# Patient Record
Sex: Female | Born: 1935 | Race: White | Hispanic: No | Smoking: Former smoker
Health system: Southern US, Community
[De-identification: ages and names within clinical notes are randomized; demographics above are authoritative.]

## PROBLEM LIST (undated history)

## (undated) DIAGNOSIS — I2510 Atherosclerotic heart disease of native coronary artery without angina pectoris: Secondary | ICD-10-CM

## (undated) DIAGNOSIS — I10 Essential (primary) hypertension: Secondary | ICD-10-CM

## (undated) DIAGNOSIS — R634 Abnormal weight loss: Secondary | ICD-10-CM

## (undated) DIAGNOSIS — E785 Hyperlipidemia, unspecified: Secondary | ICD-10-CM

## (undated) DIAGNOSIS — K219 Gastro-esophageal reflux disease without esophagitis: Secondary | ICD-10-CM

## (undated) DIAGNOSIS — R197 Diarrhea, unspecified: Secondary | ICD-10-CM

## (undated) DIAGNOSIS — K529 Noninfective gastroenteritis and colitis, unspecified: Secondary | ICD-10-CM

## (undated) DIAGNOSIS — I4891 Unspecified atrial fibrillation: Secondary | ICD-10-CM

## (undated) HISTORY — DX: Hyperlipidemia, unspecified: E78.5

## (undated) HISTORY — DX: Noninfective gastroenteritis and colitis, unspecified: K52.9

## (undated) HISTORY — PX: HERNIA REPAIR: SHX51

## (undated) HISTORY — DX: Diarrhea, unspecified: R19.7

## (undated) HISTORY — DX: Essential (primary) hypertension: I10

## (undated) HISTORY — PX: OTHER SURGICAL HISTORY: SHX169

## (undated) HISTORY — PX: CORONARY ANGIOPLASTY WITH STENT PLACEMENT: SHX49

## (undated) HISTORY — DX: Abnormal weight loss: R63.4

## (undated) HISTORY — DX: Atherosclerotic heart disease of native coronary artery without angina pectoris: I25.10

## (undated) HISTORY — DX: Gastro-esophageal reflux disease without esophagitis: K21.9

---

## 1998-05-22 ENCOUNTER — Other Ambulatory Visit: Admission: RE | Admit: 1998-05-22 | Discharge: 1998-05-22 | Payer: Self-pay | Admitting: *Deleted

## 1998-08-11 ENCOUNTER — Other Ambulatory Visit: Admission: RE | Admit: 1998-08-11 | Discharge: 1998-08-11 | Payer: Self-pay | Admitting: Obstetrics and Gynecology

## 1998-08-14 ENCOUNTER — Other Ambulatory Visit: Admission: RE | Admit: 1998-08-14 | Discharge: 1998-08-14 | Payer: Self-pay | Admitting: Obstetrics and Gynecology

## 1999-06-21 ENCOUNTER — Ambulatory Visit (HOSPITAL_COMMUNITY): Admission: RE | Admit: 1999-06-21 | Discharge: 1999-06-21 | Payer: Self-pay | Admitting: Obstetrics and Gynecology

## 2000-06-23 ENCOUNTER — Ambulatory Visit (HOSPITAL_COMMUNITY): Admission: RE | Admit: 2000-06-23 | Discharge: 2000-06-23 | Payer: Self-pay | Admitting: Obstetrics and Gynecology

## 2000-06-23 ENCOUNTER — Encounter: Payer: Self-pay | Admitting: Obstetrics and Gynecology

## 2000-09-11 ENCOUNTER — Other Ambulatory Visit: Admission: RE | Admit: 2000-09-11 | Discharge: 2000-09-11 | Payer: Self-pay | Admitting: Obstetrics and Gynecology

## 2001-06-25 ENCOUNTER — Encounter: Payer: Self-pay | Admitting: Obstetrics and Gynecology

## 2001-06-25 ENCOUNTER — Ambulatory Visit (HOSPITAL_COMMUNITY): Admission: RE | Admit: 2001-06-25 | Discharge: 2001-06-25 | Payer: Self-pay | Admitting: Obstetrics and Gynecology

## 2001-07-23 ENCOUNTER — Encounter: Payer: Self-pay | Admitting: Emergency Medicine

## 2001-07-24 ENCOUNTER — Inpatient Hospital Stay (HOSPITAL_COMMUNITY): Admission: RE | Admit: 2001-07-24 | Discharge: 2001-07-29 | Payer: Self-pay | Admitting: Cardiology

## 2001-07-25 ENCOUNTER — Encounter: Payer: Self-pay | Admitting: Cardiology

## 2001-07-26 ENCOUNTER — Encounter: Payer: Self-pay | Admitting: Cardiology

## 2002-06-28 ENCOUNTER — Encounter: Payer: Self-pay | Admitting: Obstetrics and Gynecology

## 2002-06-28 ENCOUNTER — Ambulatory Visit (HOSPITAL_COMMUNITY): Admission: RE | Admit: 2002-06-28 | Discharge: 2002-06-28 | Payer: Self-pay | Admitting: Obstetrics and Gynecology

## 2002-07-01 ENCOUNTER — Encounter: Admission: RE | Admit: 2002-07-01 | Discharge: 2002-07-01 | Payer: Self-pay | Admitting: Obstetrics and Gynecology

## 2002-07-01 ENCOUNTER — Encounter: Payer: Self-pay | Admitting: Obstetrics and Gynecology

## 2003-07-05 ENCOUNTER — Encounter: Payer: Self-pay | Admitting: Obstetrics and Gynecology

## 2003-07-05 ENCOUNTER — Ambulatory Visit (HOSPITAL_COMMUNITY): Admission: RE | Admit: 2003-07-05 | Discharge: 2003-07-05 | Payer: Self-pay | Admitting: Obstetrics and Gynecology

## 2004-03-22 ENCOUNTER — Encounter: Admission: RE | Admit: 2004-03-22 | Discharge: 2004-03-22 | Payer: Self-pay | Admitting: Gastroenterology

## 2004-09-05 ENCOUNTER — Ambulatory Visit (HOSPITAL_COMMUNITY): Admission: RE | Admit: 2004-09-05 | Discharge: 2004-09-05 | Payer: Self-pay | Admitting: Obstetrics and Gynecology

## 2005-06-01 ENCOUNTER — Inpatient Hospital Stay (HOSPITAL_COMMUNITY): Admission: EM | Admit: 2005-06-01 | Discharge: 2005-06-10 | Payer: Self-pay | Admitting: Emergency Medicine

## 2005-06-03 ENCOUNTER — Encounter (INDEPENDENT_AMBULATORY_CARE_PROVIDER_SITE_OTHER): Payer: Self-pay | Admitting: *Deleted

## 2005-09-19 ENCOUNTER — Ambulatory Visit (HOSPITAL_COMMUNITY): Admission: RE | Admit: 2005-09-19 | Discharge: 2005-09-19 | Payer: Self-pay | Admitting: Obstetrics and Gynecology

## 2006-09-23 ENCOUNTER — Ambulatory Visit (HOSPITAL_COMMUNITY): Admission: RE | Admit: 2006-09-23 | Discharge: 2006-09-23 | Payer: Self-pay | Admitting: Obstetrics and Gynecology

## 2007-10-16 ENCOUNTER — Ambulatory Visit (HOSPITAL_COMMUNITY): Admission: RE | Admit: 2007-10-16 | Discharge: 2007-10-16 | Payer: Self-pay | Admitting: Obstetrics and Gynecology

## 2008-06-20 ENCOUNTER — Encounter: Payer: Self-pay | Admitting: Gastroenterology

## 2008-07-06 ENCOUNTER — Ambulatory Visit: Payer: Self-pay | Admitting: Gastroenterology

## 2008-07-06 DIAGNOSIS — R197 Diarrhea, unspecified: Secondary | ICD-10-CM

## 2008-07-06 DIAGNOSIS — R634 Abnormal weight loss: Secondary | ICD-10-CM

## 2008-07-07 LAB — CONVERTED CEMR LAB
ALT: 16 units/L (ref 0–35)
AST: 26 units/L (ref 0–37)
Alkaline Phosphatase: 34 units/L — ABNORMAL LOW (ref 39–117)
Basophils Absolute: 0 10*3/uL (ref 0.0–0.1)
Basophils Relative: 0.5 % (ref 0.0–1.0)
CO2: 27 meq/L (ref 19–32)
Calcium: 9.1 mg/dL (ref 8.4–10.5)
Chloride: 103 meq/L (ref 96–112)
GFR calc non Af Amer: 73 mL/min
Hemoglobin: 12 g/dL (ref 12.0–15.0)
Lymphocytes Relative: 19.9 % (ref 12.0–46.0)
MCV: 96.7 fL (ref 78.0–100.0)
Monocytes Absolute: 0.4 10*3/uL (ref 0.1–1.0)
Neutro Abs: 4.4 10*3/uL (ref 1.4–7.7)
Neutrophils Relative %: 71.4 % (ref 43.0–77.0)
Platelets: 367 10*3/uL (ref 150–400)
RDW: 13.5 % (ref 11.5–14.6)
Sed Rate: 12 mm/hr (ref 0–22)
TSH: 0.83 microintl units/mL (ref 0.35–5.50)
Total Bilirubin: 0.6 mg/dL (ref 0.3–1.2)
WBC: 6.1 10*3/uL (ref 4.5–10.5)

## 2008-07-08 ENCOUNTER — Telehealth (INDEPENDENT_AMBULATORY_CARE_PROVIDER_SITE_OTHER): Payer: Self-pay | Admitting: *Deleted

## 2008-07-08 ENCOUNTER — Encounter: Payer: Self-pay | Admitting: Gastroenterology

## 2008-07-08 ENCOUNTER — Inpatient Hospital Stay (HOSPITAL_COMMUNITY): Admission: AD | Admit: 2008-07-08 | Discharge: 2008-07-11 | Payer: Self-pay | Admitting: Gastroenterology

## 2008-07-08 ENCOUNTER — Ambulatory Visit: Payer: Self-pay | Admitting: Gastroenterology

## 2008-07-11 ENCOUNTER — Telehealth (INDEPENDENT_AMBULATORY_CARE_PROVIDER_SITE_OTHER): Payer: Self-pay | Admitting: *Deleted

## 2008-07-11 ENCOUNTER — Encounter: Payer: Self-pay | Admitting: Gastroenterology

## 2008-07-12 ENCOUNTER — Ambulatory Visit: Payer: Self-pay | Admitting: Gastroenterology

## 2008-08-02 ENCOUNTER — Ambulatory Visit: Payer: Self-pay | Admitting: Gastroenterology

## 2008-08-02 DIAGNOSIS — M359 Systemic involvement of connective tissue, unspecified: Secondary | ICD-10-CM | POA: Insufficient documentation

## 2008-08-08 ENCOUNTER — Telehealth: Payer: Self-pay | Admitting: Gastroenterology

## 2008-10-13 ENCOUNTER — Telehealth: Payer: Self-pay | Admitting: Gastroenterology

## 2008-10-26 ENCOUNTER — Telehealth: Payer: Self-pay | Admitting: Internal Medicine

## 2008-11-15 ENCOUNTER — Ambulatory Visit: Payer: Self-pay | Admitting: Gastroenterology

## 2009-04-17 ENCOUNTER — Telehealth: Payer: Self-pay | Admitting: Gastroenterology

## 2009-05-11 ENCOUNTER — Ambulatory Visit (HOSPITAL_COMMUNITY): Admission: RE | Admit: 2009-05-11 | Discharge: 2009-05-11 | Payer: Self-pay | Admitting: Obstetrics and Gynecology

## 2009-05-12 ENCOUNTER — Ambulatory Visit: Payer: Self-pay | Admitting: Gastroenterology

## 2010-01-22 ENCOUNTER — Telehealth: Payer: Self-pay | Admitting: Gastroenterology

## 2010-02-19 ENCOUNTER — Ambulatory Visit: Payer: Self-pay | Admitting: Gastroenterology

## 2010-06-03 ENCOUNTER — Inpatient Hospital Stay (HOSPITAL_COMMUNITY): Admission: EM | Admit: 2010-06-03 | Discharge: 2010-06-08 | Payer: Self-pay | Admitting: Emergency Medicine

## 2010-10-17 ENCOUNTER — Ambulatory Visit (HOSPITAL_COMMUNITY): Admission: RE | Admit: 2010-10-17 | Discharge: 2010-10-17 | Payer: Self-pay | Admitting: Obstetrics and Gynecology

## 2011-01-31 NOTE — Assessment & Plan Note (Signed)
Review of gastrointestinal problems: 1. Collagenous colitis. Presented June 2009 with perfuse diarrhea. Underwent colonoscopy early July 2009 essentially normal examination but random colon biopsies showed collagenous colitis. Responded fairly quickly to budesonide 3 pills a day.  Also suffered some more than typical scope trauma during colonoscopy and was admitted with prophylactic antibiotics and observation. She was discharged without problems 48 hours later.  She switched to low-dose prednisone do to prohibitive cost of Entocort. May, 2010 she has found that 5 mg a day of prednisone does not hold her symptoms however 10 mg of prednisone a day keeps her diarrhea completely controlled.  February, 2011: had tapered down prednisone to 2.5 milligrams every other day and started to flare. Increased prednisone to 15 mg a day with dramatic, complete response.   History of Present Illness Visit Type: Follow-up Visit Primary GI MD: Rob Bunting MD Primary Provider: Gildardo Cranker, MD  Requesting Provider: n/a Chief Complaint: Colitis flare  History of Present Illness:     very pleasant 77 year old woman whom I last saw may 2010. She is here today with her son who usually accompanies her on her visits.  She has well-documented collagenous colitis. Her prednisone was tapered down to 2 half milligrams every other day and that held her for quite sometime until she had a dramatic flare of diarrhea about 4 weeks ago. she was increased to 15mg  a day.  This resulted in very quick, dramatic complete response.           Current Medications (verified): 1)  Advair Diskus 100-50 Mcg/dose  Misc (Fluticasone-Salmeterol) .Marland Kitchen.. 1 Puff Twice Daily 2)  Benicar 20 Mg  Tabs (Olmesartan Medoxomil) .... One Tablet Every Day 3)  Pravachol 80 Mg Tabs (Pravastatin Sodium) 4)  Lexapro 10 Mg  Tabs (Escitalopram Oxalate) .... 1/2 Tablet At Bedtime Once Daily 5)  Mucinex Dm 30-600 Mg  Tb12 (Dextromethorphan-Guaifenesin) ....  Twice Daily As Needed 6)  Vitamin D 1000 Unit  Tabs (Cholecalciferol) .... One Tablet Twice A Day 7)  Multivitamins   Tabs (Multiple Vitamin) .... One Tablet Once A Day 8)  Prednisone 5 Mg  Tabs (Prednisone) .... As Directed  Allergies (verified): No Known Drug Allergies  Vital Signs:  Patient profile:   77 year old female Height:      64 inches Weight:      98 pounds BSA:     1.45 Pulse rate:   68 / minute Pulse rhythm:   regular BP sitting:   110 / 64  (left arm) Cuff size:   regular  Vitals Entered By: Ok Anis CMA (February 19, 2010 2:47 PM)  Physical Exam  Additional Exam:  Constitutional:very thin, chronically. Appears unchanged over several months Psychiatric: alert and oriented times 3 Abdomen: soft, non-tender, non-distended, normal bowel sounds    Impression & Recommendations:  Problem # 1:  COLLAGENOUS COLITIS (ICD-710.9) she will continue on prednisone for now. Previously we had put her on Entocort but that was cost prohibitive. She will decrease her prednisone to 10 mg every day for the next month and then she will start 10 mg alternating with 5 mg. She will return to see me in 10 weeks and sooner if needed. She has had trouble with swelling in her ankles but most recently when taking 15 mg of prednisone a day, the swelling improved.  Patient Instructions: 1)  Start to back off on the steroids, down to 10mg  a day for one months. 2)  Then decrease to 5mg  a day, alternating with  10mg  for one month. 3)  Then 5mg  every day. 4)  Return to see Dr. Christella Hartigan in 10 weeks. 5)  A copy of this information will be sent to Dr. Miguel Aschoff. 6)  The medication list was reviewed and reconciled.  All changed / newly prescribed medications were explained.  A complete medication list was provided to the patient / caregiver.

## 2011-01-31 NOTE — Progress Notes (Signed)
Summary: drug change  Phone Note Call from Patient Call back at [phone]   Caller: son, [name] Call For: Dr. Christella Hartigan Reason for Call: Talk to Nurse Summary of Call: son states that pt is having to stay on Prednisone for maintinance and still having flares... would like to know if there is another drug that would be better for pt Initial call taken by: Vallarie Mare,  January 22, 2010 3:48 PM  Follow-up for Phone Call        pt is currently taking a half of a 5 mg prednisone now but is still having flares.  Is there anything else she can take or should she increase her prednisone? Follow-up by: Chales Abrahams CMA Duncan Dull),  January 22, 2010 3:52 PM  Additional Follow-up for Phone Call Additional follow up Details #1::        should increase to 10mg  a day, rov in next 3-4 weeks Additional Follow-up by: Rachael Fee MD,  January 22, 2010 7:53 PM    Additional Follow-up for Phone Call Additional follow up Details #2::    pt aware rov schedueld for 02/19/10 Follow-up by: Chales Abrahams CMA (AAMA),  January 23, 2010 9:11 AM

## 2011-03-11 ENCOUNTER — Ambulatory Visit (INDEPENDENT_AMBULATORY_CARE_PROVIDER_SITE_OTHER): Payer: Medicare Other | Admitting: Gastroenterology

## 2011-03-11 ENCOUNTER — Encounter: Payer: Self-pay | Admitting: Gastroenterology

## 2011-03-11 DIAGNOSIS — K5289 Other specified noninfective gastroenteritis and colitis: Secondary | ICD-10-CM

## 2011-03-18 LAB — CBC
HCT: 30.5 % — ABNORMAL LOW (ref 36.0–46.0)
HCT: 31.1 % — ABNORMAL LOW (ref 36.0–46.0)
HCT: 37.5 % (ref 36.0–46.0)
Hemoglobin: 10 g/dL — ABNORMAL LOW (ref 12.0–15.0)
Hemoglobin: 10.2 g/dL — ABNORMAL LOW (ref 12.0–15.0)
Hemoglobin: 10.3 g/dL — ABNORMAL LOW (ref 12.0–15.0)
Hemoglobin: 12.6 g/dL (ref 12.0–15.0)
Hemoglobin: 13.3 g/dL (ref 12.0–15.0)
MCHC: 33.2 g/dL (ref 30.0–36.0)
MCHC: 33.4 g/dL (ref 30.0–36.0)
MCV: 98.1 fL (ref 78.0–100.0)
MCV: 98.4 fL (ref 78.0–100.0)
Platelets: 254 10*3/uL (ref 150–400)
Platelets: 296 10*3/uL (ref 150–400)
RBC: 3.05 MIL/uL — ABNORMAL LOW (ref 3.87–5.11)
RBC: 3.12 MIL/uL — ABNORMAL LOW (ref 3.87–5.11)
RBC: 3.16 MIL/uL — ABNORMAL LOW (ref 3.87–5.11)
RBC: 3.51 MIL/uL — ABNORMAL LOW (ref 3.87–5.11)
RBC: 3.85 MIL/uL — ABNORMAL LOW (ref 3.87–5.11)
RBC: 4.1 MIL/uL (ref 3.87–5.11)
RDW: 13.9 % (ref 11.5–15.5)
RDW: 14.3 % (ref 11.5–15.5)
WBC: 5 10*3/uL (ref 4.0–10.5)
WBC: 5.5 10*3/uL (ref 4.0–10.5)
WBC: 8.9 10*3/uL (ref 4.0–10.5)

## 2011-03-18 LAB — CARDIAC PANEL(CRET KIN+CKTOT+MB+TROPI)
CK, MB: 1.8 ng/mL (ref 0.3–4.0)
CK, MB: 2.2 ng/mL (ref 0.3–4.0)
Relative Index: 2.8 — ABNORMAL HIGH (ref 0.0–2.5)
Relative Index: INVALID (ref 0.0–2.5)
Relative Index: INVALID (ref 0.0–2.5)
Total CK: 119 U/L (ref 7–177)
Total CK: 68 U/L (ref 7–177)
Total CK: 87 U/L (ref 7–177)
Troponin I: 0.02 ng/mL (ref 0.00–0.06)
Troponin I: 0.03 ng/mL (ref 0.00–0.06)

## 2011-03-18 LAB — BASIC METABOLIC PANEL
CO2: 27 mEq/L (ref 19–32)
Calcium: 7.7 mg/dL — ABNORMAL LOW (ref 8.4–10.5)
Chloride: 112 mEq/L (ref 96–112)
GFR calc Af Amer: >60 mL/min (ref >60)
GFR calc non Af Amer: >60 mL/min (ref >60)
GFR calc non Af Amer: >60 mL/min (ref >60)
Glucose, Bld: 110 mg/dL — ABNORMAL HIGH (ref 70–99)
Potassium: 2.9 mEq/L — ABNORMAL LOW (ref 3.5–5.1)
Potassium: 3.1 mEq/L — ABNORMAL LOW (ref 3.5–5.1)
Sodium: 140 mEq/L (ref 135–145)
Sodium: 141 mEq/L (ref 135–145)
Sodium: 142 mEq/L (ref 135–145)

## 2011-03-18 LAB — DIFFERENTIAL
Basophils Absolute: 0 10*3/uL (ref 0.0–0.1)
Eosinophils Relative: 0 % (ref 0–5)
Lymphocytes Relative: 18 % (ref 12–46)
Lymphs Abs: 1.6 10*3/uL (ref 0.7–4.0)
Neutro Abs: 6.8 10*3/uL (ref 1.7–7.7)

## 2011-03-18 LAB — GLUCOSE, CAPILLARY
Glucose-Capillary: 107 mg/dL — ABNORMAL HIGH (ref 70–99)
Glucose-Capillary: 113 mg/dL — ABNORMAL HIGH (ref 70–99)
Glucose-Capillary: 116 mg/dL — ABNORMAL HIGH (ref 70–99)
Glucose-Capillary: 130 mg/dL — ABNORMAL HIGH (ref 70–99)
Glucose-Capillary: 132 mg/dL — ABNORMAL HIGH (ref 70–99)
Glucose-Capillary: 143 mg/dL — ABNORMAL HIGH (ref 70–99)
Glucose-Capillary: 69 mg/dL — ABNORMAL LOW (ref 70–99)
Glucose-Capillary: 88 mg/dL (ref 70–99)

## 2011-03-18 LAB — COMPREHENSIVE METABOLIC PANEL
ALT: 19 U/L (ref 0–35)
ALT: 22 U/L (ref 0–35)
AST: 22 U/L (ref 0–37)
AST: 26 U/L (ref 0–37)
Albumin: 3.1 g/dL — ABNORMAL LOW (ref 3.5–5.2)
Albumin: 3.7 g/dL (ref 3.5–5.2)
Alkaline Phosphatase: 23 U/L — ABNORMAL LOW (ref 39–117)
Alkaline Phosphatase: 30 U/L — ABNORMAL LOW (ref 39–117)
BUN: 15 mg/dL (ref 6–23)
CO2: 27 mEq/L (ref 19–32)
Calcium: 8.5 mg/dL (ref 8.4–10.5)
Chloride: 106 mEq/L (ref 96–112)
Creatinine, Ser: 0.87 mg/dL (ref 0.4–1.2)
Creatinine, Ser: 0.89 mg/dL (ref 0.4–1.2)
GFR calc Af Amer: >60 mL/min (ref >60)
GFR calc non Af Amer: >60 mL/min (ref >60)
Glucose, Bld: 165 mg/dL — ABNORMAL HIGH (ref 70–99)
Potassium: 3.4 mEq/L — ABNORMAL LOW (ref 3.5–5.1)
Potassium: 3.7 mEq/L (ref 3.5–5.1)
Sodium: 136 mEq/L (ref 135–145)
Sodium: 139 mEq/L (ref 135–145)
Total Bilirubin: 0.6 mg/dL (ref 0.3–1.2)
Total Bilirubin: 0.7 mg/dL (ref 0.3–1.2)
Total Protein: 6 g/dL (ref 6.0–8.3)

## 2011-03-18 LAB — URINALYSIS, ROUTINE W REFLEX MICROSCOPIC
Specific Gravity, Urine: >1.046 — ABNORMAL HIGH (ref 1.005–1.030)
Urobilinogen, UA: 0.2 mg/dL (ref 0.0–1.0)
pH: 6 (ref 5.0–8.0)

## 2011-03-18 LAB — CULTURE, BLOOD (ROUTINE X 2): Culture: NO GROWTH

## 2011-03-18 LAB — URINE MICROSCOPIC-ADD ON

## 2011-03-19 NOTE — Assessment & Plan Note (Signed)
Review of gastrointestinal problems: 1. Collagenous colitis. Presented June 2009 with perfuse diarrhea. Underwent colonoscopy early July 2009 essentially normal examination but random colon biopsies showed collagenous colitis. Responded fairly quickly to budesonide 3 pills a day.  Also suffered some more than typical scope trauma during colonoscopy and was admitted with prophylactic antibiotics and observation. She was discharged without problems 48 hours later.  She switched to low-dose prednisone do to prohibitive cost of Entocort. May, 2010 she has found that 5 mg a day of prednisone does not hold her symptoms however 10 mg of prednisone a day keeps her diarrhea completely controlled.  February, 2011: had tapered down prednisone to 2.5 milligrams every other day and started to flare. Increased prednisone to 15 mg a day with dramatic, complete response   History of Present Illness Visit Type: Follow-up Visit Primary GI MD: Rob Bunting MD Primary Provider: Gildardo Cranker, MD  Requesting Provider: n/a Chief Complaint: Follow up Colitis History of Present Illness:       very pleasant 77 year old woman whom I last saw about a year ago.she is on prednsone 10mg  a day total and on that regimen her bowels are under very good control. she will actually occasionally take MiraLax powder if she hasn't had a bowel movement in a day or 2. She fell recently , she has easy bruising, very thin skin           Current Medications (verified): 1)  Advair Diskus 100-50 Mcg/dose  Misc (Fluticasone-Salmeterol) .Marland Kitchen.. 1 Puff Twice Daily 2)  Pravachol 80 Mg Tabs (Pravastatin Sodium) .... Take 1/2 Tablet By Mouth Once Daily 3)  Lexapro 10 Mg  Tabs (Escitalopram Oxalate) .... 1/2 Tablet At Bedtime Once Daily 4)  Calcium-Vitamin D 250-125 Mg-Unit Tabs (Calcium Carbonate-Vitamin D) .... Take One By Mouth Two Times A Day 5)  Multivitamins   Tabs (Multiple Vitamin) .... One Tablet Once A Day 6)  Prednisone 5 Mg  Tabs  (Prednisone) .... Take One By Mouth Two Times A Day 7)  Aspirin 81 Mg Tbec (Aspirin) .... Take One By Mouth Once Daily 8)  Tramadol Hcl 50 Mg Tabs (Tramadol Hcl) .... Take One By Mouth As Needed 9)  Hydrocodone-Acetaminophen 5-500 Mg Tabs (Hydrocodone-Acetaminophen) .... Take One By Mouth As Needed  Allergies (verified): No Known Drug Allergies  Vital Signs:  Patient profile:   77 year old female Height:      64 inches Weight:      87.2 pounds BMI:     15.02 Pulse rate:   80 / minute Pulse rhythm:   regular BP sitting:   122 / 78  (left arm) Cuff size:   regular  Vitals Entered By: Harlow Mares CMA Duncan Dull) (March 11, 2011 2:52 PM)  Physical Exam  Additional Exam:  Constitutional:  very thin, this is normal for her Psychiatric: alert and oriented times 3 Abdomen: soft, non-tender, non-distended, normal bowel sounds    Impression & Recommendations:  Problem # 1:   collagenous colitis  I would like her to be able to come off of her prednisone it more. She will decreased down to 7.5 mg a day for a month and then 5 mg a day after that if her diarrhea does not return.  Patient Instructions: 1)  Try cutting back on prednsione to 1/2 pill in evening, full pill in AM.  2)  If after 1 month, there are no changes in your bowels then try 1/2 pill in AM and 1/2 pill in PM. 3)  A  copy of this information will be sent to Dr. Duane Lope. 4)  The medication list was reviewed and reconciled.  All changed / newly prescribed medications were explained.  A complete medication list was provided to the patient / caregiver. Prescriptions: PREDNISONE 5 MG  TABS (PREDNISONE) take one by mouth two times a day  #60 x 5   Entered and Authorized by:   Rachael Fee MD   Signed by:   Rachael Fee MD on 03/11/2011   Method used:   Electronically to        [id number] (retail)       [address]       [place], [place]  [address]       Ph: [phone] or [phone]       Fax: [phone]    RxID:   [id number]

## 2011-05-14 NOTE — Discharge Summary (Signed)
Ariel Hunt, Ariel Hunt                  ACCOUNT NO.:  [id number]   MEDICAL RECORD NO.:  [id number]          PATIENT TYPE:  INP   LOCATION:  1533                         FACILITY:  [facility]   PHYSICIAN:  Rachael Fee, MD   DATE OF BIRTH:  April 04, 1923   DATE OF ADMISSION:  07/08/2008  DATE OF DISCHARGE:  07/11/2008                               DISCHARGE SUMMARY   ADMITTING DIAGNOSES:  1. An 77 year old white female with descending colon superficial      mucosal tear at time of colonoscopy, July 08, 2008, admitted for      observation.  2. Recent diarrhea and weight loss, etiology not clear, rule out      microscopic colitis.  3. Coronary artery disease status post stenting, 2002.  4. Chronic obstructive pulmonary disease.  5. History of small bowel obstruction, lysis of adhesions, 2006.  6. Status post hysterectomy and repair of incarcerated hernia.  7. Hypertension.   DISCHARGE DIAGNOSES:  1. Stable status post descending colon trauma at colonoscopy with      superficial mucosal tear, minimally symptomatic.  2. Other diagnoses as listed above.   CONSULTATIONS:  None.   PROCEDURES:  Plain abdominal films.   BRIEF HISTORY:  Ariel Hunt is a pleasant 77 year old white female, primary  patient of Dr. Miguel Aschoff, who was referred recently to Dr. Wendall Papa  for evaluation new onset of diarrhea which had been present over the  past 3 to 3-1/2 weeks.  She, prior to that, had traditionally had  problems with constipation and this was an abrupt change.  Most  concerning was the fact that her stools were very urgent and she had had  a few episodes of incontinence.  She had not noted any melena or  hematochezia, had no complaints of abdominal pain but had lost  approximately 10 pounds recently.  Stool cultures, etc., were done.  She  was then scheduled for colonoscopy which she underwent on the morning of  this admission at the [facility].  The exam was negative  but with inability  to reach the terminal ileum despite multiple  attempts.  Random biopsies were taken to rule out microscopic colitis.  Patient did sustain an 8- to 10-cm superficial colonic tear in the  descending colon at the time of the procedure.  No full perforation was  noted.  Postprocedure, she is hemodynamically stable and comfortable but  admitted to the hospital for observation and bowel rest.   LABORATORY STUDIES:  On July 08, 2008, WBC of 5.7, hemoglobin 12.3,  hematocrit of 36.6, MCV of 95, platelets 288.  Followup on July 11, 2008, WBC of 4.9, hemoglobin 11.2, hematocrit of 33.4, platelets 255.  Electrolytes within normal limits.  Glucose was 122 on admission.  Creatinine 0.69, albumin 3.5.  LFTs normal.   Plain abdominal films on July 08, 2008, showed a large amount of gas  throughout the colon and small bowel.  No evidence of free air.  Borderline heart size.  Probable COPD.   HOSPITAL COURSE:  Patient was admitted to the service of Dr. Jesusita Oka  Christella Hartigan.  She was kept on a clear liquid diet, given analgesics and antiemetics as  needed, and covered with IV Unasyn.  Fortunately, she really did not  have any significant pain as a result of the superficial tear and had a  very benign hospital course.  She was kept on relative bowel rest and IV  antibiotics for 48 hours.  Her diet was then advanced and on July 11, 2008, she was eating a solid diet, having of formed, nonbloody stools,  abdomen was soft and benign, hemoglobin was 11.2, white count 4.9, and  she was allowed discharge to home in a stable condition with  instructions to follow up with Dr. Christella Hartigan in the office in approximately  2 weeks, to call if any problems in the interim.  She was to maintain a  soft diet, continue all of her usual home meds including Advair 10/50  one puff b.i.d., Benicar 20 daily, Pravachol 40 q.h.s., Lexapro 5 mg  daily, Mucinex DM as needed, vitamin D supplement daily, multivitamin  daily, Ecotrin 325 was to  be held for 2 weeks, and then she was to  continue on Augmentin 1 p.o. t.i.d. for 7 days and Imodium 1 p.o. b.i.d.   CONDITION ON DISCHARGE:  Stable.      Mike Gip, PA-C      Rachael Fee, MD  Electronically Signed    AE/MEDQ  D:  07/27/2008  T:  07/27/2008  Job:  [id number]

## 2011-05-14 NOTE — H&P (Signed)
[name], Hunt NO.:  [id number]   MEDICAL RECORD NO.:  [id number]          PATIENT TYPE:  INP   LOCATION:  1533                         FACILITY:  [facility]   PHYSICIAN:  Rachael Fee, MD   DATE OF BIRTH:  1923/01/06   DATE OF ADMISSION:  07/08/2008  DATE OF DISCHARGE:                              HISTORY & PHYSICAL   PROBLEM:  Descending colon injury at colonoscopy done today, July 08, 2008.   HISTORY OF PRESENT ILLNESS:  Ariel Hunt is a pleasant 77 year old white  female primary patient of Dr. Duane Lope who was referred recently to Dr.  Christella Hartigan for evaluation of new onset of diarrhea.  Apparently, she has had  diarrhea for approximately 3 to 3-1/2 weeks.  She has traditionally had  problems with constipation and this is a an abrupt change.  She  described her stools as being very loose and occurring two to three  times daily.  She says occasionally she has some days with no bowel  movement at all.  What is most concerning to her is that the stools are  very urgent and she has had a few episodes of incontinence.  She has not  noted any melena or hematochezia.  Has no complaints of abdominal pain,  but apparently has lost 10 pounds recently.  Stool cultures, etc. done  per Dr. Tenny Craw were negative.  She was then scheduled for colonoscopy  which she underwent this morning at the [facility].  This was negative, but  with inability to reach the terminal ileum despite multiple attempts.  Random biopsies were taken to rule out microscopic colitis.  The patient  did sustain an 8-10 cm superficial colonic tear in the descending colon.  No full perforation noted.  For that reason, she is admitted for  observation and bowel rest.   CURRENT MEDICATIONS:  1. Advair 10/50 one puff twice daily.  2. Benicar 20 mg every daily.  3. Pravachol 40 p.o. q.h.s.  4. Lexapro 5 mg p.o. daily.  5. Mucinex DM as needed.  6. Vitamin D supplement daily.  7. Multivitamin daily.  8. Imodium  as needed.  9. Ecotrin 325 q.a.m.   ALLERGIES:  NO KNOWN DRUG ALLERGIES.   PAST HISTORY:  1. Pertinent for coronary artery disease.  She is status post PTCA and      stent in 2002.  She has a history of small bowel obstruction      requiring lysis of adhesions in June 2006.  She has had a previous      incarcerated ventral hernia and is status post hysterectomy.  2. Hypertension.   ALLERGIES:  NO KNOWN DRUG ALLERGIES.   FAMILY HISTORY:  Mother deceased at 35 of old age.  Father deceased  with colon cancer.  One sister with stomach cancer and one sister with  breast cancer.   SOCIAL HISTORY:  The patient is a homemaker.  I believe she currently  lives alone.  Is an ex-smoker.  No EtOH.   REVIEW OF SYSTEMS:  HEENT:  Unremarkable.  CARDIOVASCULAR:  Denies any  chest pain or anginal symptoms.  PULMONARY:  Negative for any sputum  production.  She does have a chronic cough.  GI: As outlined above.  GU:  Negative.  NEUROLOGICAL/PSYCHIATRIC:  Pertinent for history of  depression.  All other review of systems negative.   PHYSICAL EXAMINATION:  GENERAL:  Well-developed elderly, thin white  female in no acute distress.  VITAL SIGNS:  Temperature is 96.4, blood pressure 124/64, pulse is 64,  sats 98.  HEENT:  Nontraumatic, normocephalic.  EOMI, PERRLA.  Sclerae anicteric.  NECK:  Supple without nodes.  There is no JVD.  CARDIOVASCULAR:  Regular rate and rhythm with S1-S2.  No murmur, rub or  gallop.  PULMONARY:  Clear to A and P.  ABDOMEN:  Soft.  Bowel sounds are active.  She is nontender.  There is  no palpable mass or hepatosplenomegaly.  RECTAL:  Exam is not done at this time.  NEUROLOGICAL:  The patient is alert and oriented x3 and grossly  nonfocal.  EXTREMITIES:  She does have trace edema in the feet bilaterally.   LABORATORY DATA:  WBC of 5.7, hemoglobin 12.3, hematocrit of 36.6,  platelets 288.  Electrolytes within normal limits.  LFTs within normal  limits.    IMPRESSION:  1. An 77 year old white female with descending colon superficial      mucosal tear at time of colonoscopy July 08, 2008.  2. Recent diarrhea and weight loss, etiology not clear.  Rule out      microscopic colitis.  3. Coronary artery disease status post stent 2002.  4. COPD.  5. History of small bowel obstruction, lysis of adhesions 2006.  6. Status post hysterectomy and repair of incarcerated hernia.  7. Hypertension.   PLAN:  The patient is admitted to the service of Dr. Wendall Papa for IV  fluid hydration, bowel rest for approximately 48 hours.  She will be  covered empirically with IV Unasyn.  Will check plain abdominal films,  pain control as needed.  For further details, please see the orders.      Mike Gip, PA-C      Rachael Fee, MD  Electronically Signed    AE/MEDQ  D:  07/08/2008  T:  07/08/2008  Job:  [id number]

## 2011-05-17 NOTE — Consult Note (Signed)
[place]  Patient:    Ariel Hunt, Ariel Hunt                         MRN: [id number] Proc. Date: 07/28/01 Adm. Date:  [id number] Attending:  Corliss Marcus CC:         Miguel Aschoff, M.D.  Francisca December, M.D.   Consultation Report  REFERRING PHYSICIAN:  Francisca December, M.D.  CHIEF COMPLAINT:  Syncope and ventricular tachycardia.  HISTORY OF PRESENT ILLNESS:  This is a very pleasant 77 year old white female with a 1 month history of dizzy episodes associated with presyncope and syncopal episodes.  On the day prior to admission she had syncopal episodes associated with trauma to the arms.  She was admitted to [facility] Emergency Room with no complaints of chest pain.  She developed sustained ventricular tachycardia up to 300 beats per minute which was either ventricular tachycardia or SVT with aberrancy, and then developed atrial fibrillation with rapid ventricular response.  She was given IV amiodarone and Cardizem and converted to atrial flutter with 2:1 block, she then went into normal sinus rhythm.  She went on to cardiac catheterization because of an elevation in her CPK, MB and troponin levels.  Cardiac catheterization revealed ejection fraction 45-50%, 1-2 MR, nonobstructive coronary disease of the LAD, 80-90% OM-1 and nonobstructive RCA disease.  She subsequently underwent PTCA stenting of the first marginal branch.  I am now asked to evaluate for ventricular tachycardia treatment and atrial arrhythmias.  She has been off amiodarone over the weekend.  She has had no further arrhythmias.  PAST MEDICAL HISTORY:  Degenerative joint disease, minor head and arm trauma secondary to recent fall.  Recent episodes of syncope at least 5 episodes over the past month.  MEDICATIONS ON ADMISSION: 1. Allegra. 2. Vioxx. 3. Fosamax.  CURRENT MEDICATIONS AT THIS TIME INCLUDE: 1. Aspirin 325 mg a day. 2. Plavix 75 mg a day. 3. Lopressor 12.5 mg b.i.d. 4.  Potassium chloride 20 mEq b.i.d. 5. Imdur 30 mg a day. 6. Altace 10 mg b.i.d. 7. Tequin 400 mg q. day. 8. Norvasc 5 mg a day.  ALLERGIES:  None.  SOCIAL HISTORY:  She denies having drug abuse.  She does have a history of tobacco use and occasionally drinks alcohol.  Apparently she had smoked but quit 2 years ago.  She is unemployed and lives at home.  PAST SURGICAL HISTORY:  She is status post a hernia repair 3 years ago, complicated by bowel blockage, also she has sinus problems and arthritis.  REVIEW OF SYSTEMS:  Other than what is stated in the HPI she has a cough productive of brown sputum over the past couple of days.  She also has complained of some minor trauma to her arms after having her last syncopal episode.  She does have problems with arthritis.  She also has seasonal allergies.  She denies any chest pain, shortness of breath, PND, orthopnea. No lower extremity edema.  No nausea, vomiting, diarrhea, or abdominal pain.  PHYSICAL EXAMINATION:  VITAL SIGNS:  Blood pressure 135/75 with a heart rate of 70, O2 saturations 95% on room air.  GENERAL:  This is well-developed thin white female in no acute distress.  HEENT:  Benign.  NECK:  Supple.  No bruits.  LUNGS:  Scattered rhonchi.  Otherwise clear.  HEART:  Regular rate and rhythm.  No murmurs, rubs or gallops.  Normal S1, S2.  ABDOMEN:  Soft,  nontender, nondistended with active bowel sounds.  EXTREMITIES:  No cyanosis, erythema or edema.  EKG:  Shows normal sinus rhythm at 70 beats per minute with nonspecific ST T wave abnormality more prominent in the anterior leads.  LABORATORY DATA:  Sodium 133, potassium 3.8, chloride 97, bicarbonate 27, BUN 9, creatinine 0.5, glucose 114, white cell count 8.8.  Hemoglobin 12.1, hematocrit 34, platelet count 246.  Telemetry shows normal sinus rhythm, there have been no further arrhythmias.  Her 12-lead EKG on the initial hospitalization showed several episodes of 1 to 3  beats of nonsustained VT at a time, and then she developed a 12 beat run of nonsustained ventricular tachycardia initiated by PVC.  Then she developed sustained ventricular tachycardia initiated by what appears to be effusion beat.  The ventricular tachycardia was at a rate of approximately 280 beats per minute, it then terminated into atrial fibrillation with rapid ventricular response and subsequently after loading with IV amiodarone was converted to atrial flutter with 2:1 block and then normal sinus rhythm.  ASSESSMENT:  Coronary disease status post PTCA stenting of the OM-1 branch. 2. Ventricular tachycardia with syncope with no further recurrence. Questionable ischemia versus RVT. 3. Hypertension which is stable. 4. Mild LV dysfunction with EF 45-50%. 5. COPD exacerbation with increased brown sputum production.  PLAN: 1. EP study today to assess for inducibility of ventricular tachycardia.    Risks and benefits explained including risk of CVA, MI, death, arrhythmia,    bleeding and infection or pericardial tamponade.  The patient understands    and wishes to proceed. 2. Clear liquid breakfast. 3. Continue low dose beta blockers. 4. Tequin for bronchitis. DD:  07/28/01 TD:  07/28/01 Job: [id number]

## 2011-05-17 NOTE — Cardiovascular Report (Signed)
[place]. [facility]  Patient:    RYNN, MARKIEWICZ                         MRN: [id number] Proc. Date: 07/24/01 Adm. Date:  [id number] Attending:  Corliss Marcus CC:         Miguel Aschoff, M.D.  Cardiac Catheterization Laboratory   Cardiac Catheterization  PROCEDURES PERFORMED: 1. Left heart catheterization. 2. Coronary angiography. 3. Left ventriculogram. 4. Percutaneous transluminal coronary angiography/stent of first large    marginal.  INDICATIONS:  Layney Gillson is a 77 year old woman who presented late yesterday evening to [facility] with complaints of syncope.  She was admitted and had cardiac enzymes drawn, the initial set of which was positive with an MB fraction of 25 and a total of 444.  She was given aspirin and IV nitroglycerin.  She had the spontaneous onset of ventricular tachycardia at about 4:30 in the morning which converted after about 50 seconds to atrial fibrillation with rapid ventricular response.  This was subsequently treated with IV Amiodarone, as well as IV Cardizem.  She has remained stable since. Atrial fibrillation has now converted to sinus rhythm.  She is brought to the cardiac catheterization laboratory to identify likely CAD as an etiology and provide for further therapeutic options.  PROCEDURAL NOTE:  Left heart catheterization was performed following the percutaneous insertion of a 5-French catheter sheath utilizing an anterior approach over a guiding J-wire into the right femoral artery.  A 110 cm pigtail catheter was used to measure pressures in the ascending aorta and left ventricle both prior to and following the ventriculogram.  A 30 degree RAO cine left ventriculogram was performed utilizing a power injector.  Coronary angiography was then performed using 5-French #4 left and right Judkins catheters.  Cineangiography of each coronary artery was conducted in multiple LAO and RAO projections.  We then proceeded  with coronary intervention.  The 5-French catheter sheath was exchanged for a 7-French catheter sheath over a long guiding J-wire.  The patient received 3000 units of heparin intravenously.  She was already on a constant infusion of heparin at the time.  She received a double bolus of Integrilin and constant infusion.  A 7-French Sci-med Voda left #4 guiding catheter was advanced to the ascending aorta where the left coronary os was engaged.  A 0.014 Sci-Med luge intracoronary guide wire was passed across the lesion without difficulty.  Initial attempts to cross the lesion with a 3.5/20 mm Sci-Med Quantum Ranger were unsuccessful.  This was removed and it was replaced with a 2.0/20 mm Sci-Med Maverick intracoronary balloon.  This crossed the lesion with little difficulty.  It was inflated to 8 atm for approximately one minute.  This device was removed and a 3.5/18 mm Sci-Med NIR intracoronary stent was placed across the lesion carefully.  It was inflated to a maximum deployment pressure of 14 atm.  The guide wire was withdrawn and then advanced into a small subbranch of this large marginal. The 2.0/20 mm Sci-Med Maverick balloon was returned to the circulation across a 70% lesion and an 80% lesion in this branch artery.  It was inflated there to 4 atm for one minute.  This device was removed along with a guide wire.  At this point, adequate patency was confirmed in orthogonal views with coronary angiography.  The guiding catheter was removed and hemostasis was achieved by suturing the sheath in place.  The patient was transported  to the recovery area in stable condition with intact distal pulse.  The ACT at close was 267 seconds.  HEMODYNAMICS:  Systemic arterial pressure was 125/64, with a mean of 88 mmHg. There was no systolic gradient across the aortic valve.  The left ventricular end-diastolic pressure was 10 mmHg preventriculogram and 13 mmHg postventriculogram.  ANGIOGRAPHY:  The  left ventriculogram demonstrated mild global hypokinesis. No clear regional wall motion abnormality was seen, with the exception of a small area of focal hypokinesis in the diaphragmatic inferior wall.  There was 1-2+ mitral regurgitation.  There was left and right coronary calcification seen.  The calculated ejection fraction utilizing a single-plane cine method was 47%.  There was a right dominant coronary system present.  The main left coronary artery had a 20-30% proximal narrowing and a 20% distal narrowing.  The left anterior descending artery and its branches were moderately diseased. This vessel has an ostial 30-40% stenosis and then a 30-40% stenosis in the more distal portion of the proximal segment, just before the origin of a small first diagonal and first septal perforator.  After these branch vessels arise, there is a diffuse 30% stenosis.  There is a large second diagonal.  The ongoing LAD has a 30-40% stenosis at its origin near the branch of the second diagonal.  The left circumflex artery and its branches were highly diseased.  This vessel contains a tubular 80-90% stenosis, which extends approximately 10-12 mm and appears slightly ulcerated.  It then opens into a large marginal branch which is a major vessel on the lateral surface of the heart.  There is a small inferior subbranch that has an 80% stenosis about 1 cm from the origin.  The ongoing circumflex is mildly diseased, has luminal irregularities, and gives rise to two small posterolateral branches at the base of the heart.  The right coronary artery and its branches are moderately diseased.  The vessel contains a 30-40% narrowing in the mid portion and luminal irregularities throughout the proximal segment.  In the distal portion, just prior to the bifurcation into the posterolateral segment and posterior descending artery, there is a 40-50% stenosis.  In the origin of the posterior descending artery, there is  a 30-40% narrowing.  The ongoing posterolateral  segment is without significant obstruction and gives rise to a single large posterolateral branch with a 30% stenosis at its origin.  Collateral vessels are not seen.  Following balloon dilatation and stent implantation in the first large marginal, there is no residual stenosis.  As well, there is no residual stenosis in the inferior subbranch of that marginal that had balloon angioplasty only.  FINAL IMPRESSIONS: 1. Ventricular tachycardia, monomorphic, producing syncope. 2. Acute coronary syndrome, without symptoms of angina pectoris. 3. Status post successful percutaneous transluminal coronary angiography and    stent implantation of large first marginal. 4. Atherosclerotic coronary vascular disease, two-vessel. 5. Mildly decreased left ventricular systolic function, with ejection fraction    of 47%. 6. Mild to moderate mitral regurgitation.  PLAN:  The patient will be treated in the standard fashion after angioplasty with aspirin, Plavix, and 18 hours of Integrilin.  I anticipate the need for an EP study to insure that VT is not inducible prior to discharge. DD:  07/24/01 TD:  07/24/01 Job: [id number]

## 2011-05-17 NOTE — H&P (Signed)
Ariel Hunt, Ariel Hunt NO.:  [id number]   MEDICAL RECORD NO.:  [id number]          PATIENT TYPE:  EMS   LOCATION:  ED                           FACILITY:  [facility]   PHYSICIAN:  Lorre Munroe., M.D.DATE OF BIRTH:  [birth year]   DATE OF ADMISSION:  06/01/2005  DATE OF DISCHARGE:                                HISTORY & PHYSICAL   CHIEF COMPLAINT:  Vomiting.   PRESENT ILLNESS:  The patient is a 77 year old white female who went to the  [facility] today because of inability to keep anything down,  decreased passage of flatus, abdominal cramps, several episodes of vomiting.  She has had small intestinal obstruction requiring resection in the past.  She is not sure what operations she had that led to the obstruction in the  first place.  She has had hernia repair before.  At the [facility], she had abdominal x-rays which were consistent with a small  intestinal obstruction.  There was no free air on the upright chest x-ray.  The patient is sent to me for further care.   PAST MEDICAL HISTORY:  1.  The patient has coronary artery disease and had a stent, by Dr. Amil Amen,      several years ago.  No history of congestive failure.  2.  There is a history of COPD due to smoking but she stopped smoking 9-10      years ago.  3.  She has high blood pressure.  4.  She denies diabetes.   MEDICATIONS:  1.  Aspirin 81 mg a day.  2.  Benicar 20 mg a day.  3.  Omeprazole 20 mg a day.  4.  Pravastatin 40 mg daily.  5.  Lexapro 10 mg daily.  6.  Albuterol inhaler as needed.   No medicine allergies.   SURGERY:  No major operations, except as mentioned above.   FAMILY HISTORY AND CHILDHOOD ILLNESSES:  Unremarkable.   REVIEW OF SYSTEMS:  She denies palpitations,rapid heartbeat, chest pain,  shortness of breath.  Denies chronic GI problems, except as mentioned above.   PHYSICAL EXAMINATION:  GENERAL:  A very thin but not unhealthy-appearing  elderly  lady.  MENTAL STATUS:  Normal.  Very slightly hard of hearing.  No acute distress.  HEENT:  Unremarkable with no abnormalities of the oropharynx.  NECK:  No neck mass.  No thyroid enlargement.  No thyroid nodule.  No  carotid bruit is heard.  CHEST:  Clear to auscultation.  There is no chest wall tenderness.  No  breast masses are detected.  HEART:  Rate and rhythm normal.  No murmur or gallop.  ABDOMEN:  Markedly distended and tympanitic but soft with no masses felt.  No rebound tenderness.  There is no hernia.  There are multiple well-healed  incisions, including a Pfannenstiel type long incision across the lower  abdomen, a lower midline incision, right lower quadrant incision.  EXTREMITIES:  There are good pulses at the groins and feet.  There is no  edema.  No ulceration.  No cyanosis.  LYMPH  NODES:  Not enlarged in the groin, axilla, or neck.  NEUROLOGIC:  Grossly intact.   IMPRESSION:  1.  Small intestinal obstruction, probably due to adhesions.  2.  Hypertension, stable.  3.  Coronary artery disease, stable.   PLAN:  1.  I will have an NG tube put down and institute NG suction, give IV fluids      and follow the patient closely.  2.  If the white count was markedly elevated, or if the patient seems to not      be doing well we will move on to laparotomy.      WB/MEDQ  D:  06/01/2005  T:  06/01/2005  Job:  [id number]

## 2011-05-17 NOTE — Procedures (Signed)
[place]. [facility]  Patient:    Ariel Hunt, Ariel Hunt                         MRN: [id number] Proc. Date: 07/28/01 Adm. Date:  [id number] Disc. Date: [id number] Attending:  Corliss Marcus CC:         Francisca December, M.D.   Procedure Report  REFERRING PHYSICIAN:  Francisca December, M.D.  HISTORY OF PRESENT ILLNESS:  This is a 77 year old, white female, who presented with an acute MI.  Cardiac catheterization showed a significant left circumflex coronary artery and physically underwent percutaneous transluminal intervention.  When she presented, she was noted to be in ventricular tachycardia at a close 200-300 beats per minute.  This then subsequently converted to atrial fibrillation then atrial flutter.  She now presents for electrophysiology testing.  PROCEDURES PERFORMED:  Electrophysiology study.  INDICATIONS:  Ventricular tachycardia.  INTRAVENOUS ACCESS:  Via right femoral vein 6 French sheath.  DESCRIPTION OF PROCEDURE:  The patient is brought to the cardiac catheterization laboratory in a fasting, nonsedated state.  Informed consent was obtained.  The patient was connected to continuous heart rate and pulse oximetry monitoring, and intermittent blood pressure monitoring.  The right groin was prepped and draped in a sterile fashion.  Xylocaine 1% was used for local anesthesia.  Using the modified Seldinger technique, a 6 French sheath was placed in the right femoral vein.  Under fluoroscopic guidance, a quadripolar pacing catheter was placed into the right ventricular cavity. Pacing thresholds were obtained which were good.  Program stimulation was then performed from the right ventricular apex using pacing cycle lengths of 600 and 400 msec with single, double, and triple extrastimuli.  The catheter was then moved to the right ventricular outflow tract and program stimulation with single, double, and triple ventricular extrastimuli was performed at  pacing cycle lengths of 600 and 400 msec.  At the end of the procedure all catheters and sheaths were removed.  Manual compression was performed until adequate hemostasis was obtained.  The patient was transferred back to her room in stable condition.  RESULTS: 1. Baseline measurements underlying rhythm, normal sinus rhythm.  R-R interval    743 msec.  P-R interval 161 msec, QRS 86 msec, Q-T interval 383 msec. 2. Right ventricular affective refractory.  Pacing cycle length of 600 and    400 msec from the right ventricular apex was 260 and 250 msec,    respectively, on the ventricular affective refractory.  From the    right ventricular outflow tract a pacing cycle length of 500 msec    and 400 msec was 290 msec in both.  Using single, double, and triple ventricular extrastimuli a pacing cycle length of 600 and 400 msec from the right ventricular apex did not induce any ventricular arrhythmias.  A pacing cycle length of 500 and 400 msec from the right ventricular outflow tract with single, double, and triple ventricular extrastimuli, there was no arrhythmias induced.  IMPRESSION: 1. Ventricular tachycardia in the setting of an acute myocardial infarction. 2. Successful percutaneous transluminal intervention of the left circumflex. 3. Negative electrophysiology study for inducible arrhythmia.  PLAN:  Further work-up per Dr. Amil Amen. DD:  08/03/01 TD:  08/04/01 Job: [id number]

## 2011-05-17 NOTE — Op Note (Signed)
Ariel Hunt, Ariel Hunt NO.:  [id number]   MEDICAL RECORD NO.:  [id number]          PATIENT TYPE:  INP   LOCATION:  1608                         FACILITY:  [facility]   PHYSICIAN:  Lorre Munroe., M.D.DATE OF BIRTH:  02/09/1923   DATE OF PROCEDURE:  06/02/2005  DATE OF DISCHARGE:                                 OPERATIVE REPORT   PREOPERATIVE DIAGNOSES:  Small intestinal obstruction due to adhesions.   POSTOPERATIVE DIAGNOSES:  Small intestinal obstruction due to adhesions.   OPERATION:  1.  Lysis of adhesions.  2.  Appendectomy.   SURGEON:  Lebron Conners, MD.   ASSISTANTAngelia Mould. Derrell Lolling, MD   ANESTHESIA:  General.   DESCRIPTION OF PROCEDURE:  After the patient was monitored and anesthetized  and had a Foley catheter inserted and routine preparation and draping of the  abdomen, I made a lower midline incision utilizing a previous lower midline  incision. I very carefully entered the abdomen because the bowel was very  distended and abdominal wall very thin. There were adhesions of the small  intestine to the anterior abdominal wall and I very carefully took those  down. Laterally away from the incision, there was free space and no evident  adhesions. There was a moderate amount of serous fluid in the abdominal  cavity.  There was no evidence of perforation. The small bowel was massively  distended. The colon was undistended. I took down adhesions and eviscerated  the small intestine and tracked the bowel down to evident entry into the  cecum. At first it appeared that the small bowel was intussuscepted into the  cecum. However, as I mobilized the cecum and took down adhesions, I could  see another loop of congested bowel lateral to an adhesion adjacent to the  cecum. Upon release of that adhesion, the obstruction was resolved, the  bowel coming out and going directly into the cecum in a normal anatomic  away. There was some swelling and thickness of the  bowel wall but after  release of the adhesion, it became nice and pink and fluid could easily pass  through the area and into the cecum. I did not feel that resection would be  necessary. I checked the area of dissection lateral to the cecum and found  no evidence of bleeding. There was a very unusual appearing appendix or  fibrotic type structure which was evidently attached to the cecum but also  attached down to the right tube and ovary. It had such an unusual appearance  that I thought it should be removed and so I dissected it free and clamped  across it at its base adjacent to the cecum and ligated it with 2-0 silk and  amputated them. I cauterized the exposed part with the Bovie. I then used a  2-0 silk pursestring suture in the seromuscular layers of the cecum to  invert that stump into the cecum and tied that and it excluded it nicely  from the peritoneal cavity. I went through the small bowel and cut several  other adhesions that produced potential  obstructive areas. I then milked the  small bowel contents back into the stomach where it was removed by the  nasogastric tube. I made sure that the NG tube was in good position and  directed the anesthetist to secure it to the nodes. I then checked all  operated areas and there appeared to be good  integrity of the small intestine. The sponge, needle and instrument counts  were correct. I again checked the small bowel that had been obstructed and  it appeared healthy. I closed the fascia with #1 PDS and closed the skin  with staples. The patient tolerated the operation well.      WB/MEDQ  D:  06/02/2005  T:  06/03/2005  Job:  [id number]   cc:   Charlesetta Shanks  [address]  [place] [address]  Fax: [phone]

## 2011-05-17 NOTE — Discharge Summary (Signed)
NAMEREIGHLYNN, SWINEY NO.:  [id number]   MEDICAL RECORD NO.:  [id number]          PATIENT TYPE:  INP   LOCATION:  1608                         FACILITY:  [facility]   PHYSICIAN:  Lorre Munroe., M.D.DATE OF BIRTH:  1923-12-22   DATE OF ADMISSION:  06/01/2005  DATE OF DISCHARGE:  06/10/2005                                 DISCHARGE SUMMARY   HISTORY:  This is a 77 year old white female who was seen at [facility] on the day of admission with abdominal pain and vomiting and x-ray  picture of small intestinal obstruction. The patient has stable hypertension  and stable coronary artery disease. She was referred to me for care. Exam of  the abdomen was distended and slightly tender. Exam was otherwise negative.   HOSPITAL COURSE:  I hospitalized the patient and put down a nasogastric  tube, treated her conservatively for a day. She continued to have abdominal  pain and high nasogastric output. X-rays remained consistent with small  intestinal obstruction. I advised laparotomy and the patient agreed to have  it. On June 02, 2005, I did the lysis of adhesions for small bowel  obstruction and also did an appendectomy because of a very abnormal  appearance of the appendix. That turned out to be findings consistent with  benign ovarian tissue and obviously was not the appendix at all. The patient  did well after the operation with good return of GI function. No wound or  infectious problems and no medical issues. On June 10, 2005 she was eating  quite well, bowels were moving and wound was well-healed. Staples were  removed and Steri-Strips applied. We let her go home on a general diet and  the patient is to return to see me in 2-3 weeks.   DIAGNOSIS:  Small-bowel obstruction due to adhesions.   OPERATION:  Lysis of adhesions.   DISCHARGE CONDITION:  Improved.       WB/MEDQ  D:  06/16/2005  T:  06/17/2005  Job:  [id number]

## 2011-05-17 NOTE — H&P (Signed)
[place]. [facility]  Patient:    Ariel, Hunt                         MRN: [id number] Adm. Date:  [id number] Attending:  Devoria Albe CC:         Miguel Aschoff, M.D.   History and Physical  CHIEF COMPLAINT: Syncope.  HISTORY OF PRESENT ILLNESS: Ariel Hunt is a pleasant 77 year old woman, who approximately one week ago had a severe episode of light headedness that resolved spontaneously and lasted only seconds.  She was well until yesterday when she again had similar symptoms but then it led into an episode of complete loss of consciousness, during which she struck the floor and injured her right elbow.  She awoke spontaneously.  There was no confusion.  It lasted only seconds.  She felt reasonably well subsequent to this.  However, later in the day yesterday she had another episode, again preceded by a short prodrome of light headedness.  This time she fell as she was trying to reach a chair and struck her head.  There was a small laceration that occurred.  After she awoke again within a few seconds and without confusion she was brought to the emergency room.  Apparently en Hunt in the car she was noticed to have some jerking movements by her family.  After she arrived in the emergency room here she was well and had no specific complaints.  She specifically denies any recent episodes of chest pain or pressure, no dyspnea, no tachy palpitations. She was monitored in the emergency room during the night as there were no beds for admission, which I ordered around 12 a.m.  At 0415 she had an episode of sustained VT, which lasted approximately 50 seconds.  She then spontaneously reverted or converted to atrial fibrillation which was rapid and irregular with a narrow complex.  At the time of my evaluation she is in atrial flutter at 2:1 block, rate 150.  She continues to deny any associated symptoms. During the episode of wide complex tachycardia here in the emergency  room she was awake and responsive and had no specific complaints.  PAST MEDICAL HISTORY:  1. Osteoporosis.  2. Chronic allergic rhinitis.  3. DJD of the wrists and hands.  PAST SURGICAL HISTORY:  1. Status post hysterectomy.  2. Status post incarcerated hernia repair.  SOCIAL HISTORY: She is a Futures trader.  She lives with her husband he is retired, here in [place], [place].  She has three children, no grandchildren. She quit smoking about two to three years ago and enjoys one or two glasses of wine per week.  Caffeine intake is not excessive.  CURRENT MEDICATIONS:  1. Fosamax, ? dose, once per week.  2. Vioxx 25 mg p.o. q.d.  3. Allegra 180 mg p.o. q.d.  ALLERGIES: None known.  FAMILY HISTORY: Her mother died of "old age," and was 6.  Her father died in his 75s and had stomach cancer.  She has one sister with breast cancer.  REVIEW OF SYSTEMS: She has vision in both eyes and wears glasses.  Hearing in both eyes, somewhat diminished; does not use any hearing aid.  She has most of her own teeth and no difficulty swallowing.  She has a frequent minimally productive cough in the morning.  No hemoptysis.  She denies any shortness of breath, orthopnea, PND, or lower extremity edema.  She has not had any abdominal pain.  She is prone to constipation unless she eats lots of fruit and vegetables.  She has had no melena, hematochezia, or hematemesis.  She has no dysuria, hematuria, or nocturia.  No difficulty holding urine.  She does not complain of joint pain in the hips, knees, or lower back; mostly this is confined to the elbows, wrists, and hand joints.  She has no muscle weakness. She has never had a stroke.  Never been treated for emotional or neurologic disorder.  PHYSICAL EXAMINATION:  VITAL SIGNS: Blood pressure 160/70, pulse 148 and regular, respirations 16, temperature 94.8 degrees.  GENERAL: This is an alert, cooperative 77 year old woman in no acute  distress.  HEENT: Atraumatic, normocephalic cranium.  PERRLA.  EOMI.  Sclerae anicteric. Oral mucosa pink and moist.  Tongue not coated.  Teeth and gums in adequate repair.  NECK: Supple without thyromegaly or masses.  Carotid upstrokes are normal.  No bruits, no jugular venous distention.  CHEST: Diminished breath sounds and reduced excursion bilaterally but no wheezes, rales or rhonchi.  HEART: Tachycardic rhythm, normal S1 and S2; no murmur, click, gallop, or rub noted.  PMI nondisplaced.  ABDOMEN: Soft, protuberant, nontender, without hepatosplenomegaly or midline pulsatile masses.  Bowel sounds heard in all quadrants.  GU: External genitalia atrophic.  RECTAL: Not performed.  EXTREMITIES: Full range of motion.  No edema.  Intact distal pulses.  NEUROLOGIC: Cranial nerves 2-12, motor and sensory grossly intact.  Gait not tested.  SKIN: Warm and dry, clear.  ACCESSORY CLINICAL DATA: Serum sodium 136, potassium 3.1, chloride 103, CO2 27.  BUN, creatinine, glucose all within normal limits.  Total bilirubin normal.  Alkaline phosphatase 34.  SGOT 48, slightly elevated.  Other liver associated enzymes normal.  Calcium and albumin normal.  Initial CK total 444 with MB 25.9, relative index 5.8.  Troponin 0.08.  Subsequent CK-MB at 0400 was 471 with MB 11.1 and relative index 2.4.  Troponin 0.06.  Initial electrocardiogram showed sinus rhythm with slight ST depression in V4, V5, and V6, approximately 1 mm.  EKG at 0600 on July 24, 2001 shows atrial flutter with 2:1 block, more apparent ST segment depression in the lateral precordial leads of 2-3 mm.  ASSESSMENT:  1. Syncope secondary to wide complex tachycardia, likely ventricular     tachycardia.  2. Acute coronary syndrome but without associated chest symptoms.  3. Mild hypokalemia.  4. Atrial fibrillation/flutter.  5. Osteoporosis.  6. Degenerative joint disease.  PLAN:  1. The patient will receive intravenous  nitroglycerin and has overnight, has      already received aspirin, four baby tablets given.  2. Begin p.o. and IV metoprolol.  Continue IV amiodarone and begin IV     Cardizem.  3. Begin IV heparin for systemic anticoagulation.  4. Planning for cardiac catheterization with coronary angiography in the     near future as I suspect strongly that this entire syndrome is due to     coronary artery disease and represents an unstable pattern. DD:  07/24/01 TD:  07/24/01 Job: [id number]

## 2011-05-17 NOTE — Discharge Summary (Signed)
[place]. [facility]  Patient:    Ariel Hunt, Ariel Hunt Visit Number: [id number] MRN: [id number]          Service Type: MED Location: [id number] Attending Physician:  Corliss Marcus Dictated by:   Anselm Lis, N.P. Admit Date:  07/24/2001 Discharge Date: 07/29/2001   CC:         Miguel Aschoff, M.D.   Discharge Summary  PRIMARY CARE Laiyah Exline:  Miguel Aschoff, M.D.  DATE OF BIRTH:  09-23-2023  PROCEDURES: 1. (July 24, 2001) percutaneous transluminal coronary angioplasty/stent of    first large obtuse marginal. 2. (July 28, 2001) electrophysiology study which was negative for inducible    ventricular tachycardia.  CONSULTANTS:  Armanda Magic, M.D., EPS.  DISCHARGE DIAGNOSES: 1. Atherosclerotic cardiovascular disease; status post stent of first obtuse    marginal (large). 2. Chronic obstructive pulmonary disease; chronic bronchitis. 3. Syncope secondary to ventricular tachycardia, ischemic induced.  Negative    electrophysiology study for inducible ventricular tachycardia.  Distal left    anterior descending, ______ 4. Osteoporosis/degenerative joint disease; on Fosamax. 5. Chronic allergic rhinitis. 6. Status post hysterectomy. 7. Status post incarcerated hernia repair.  PLAN:  The patient was discharged home in stable condition.  DISCHARGE MEDICATIONS:  1. Plavix 75 mg p.o. q.d. for one month.  2. Altace 10 mg p.o. b.i.d.  3. Norvasc 5 mg p.o. q.d.  4. Tequin 400 mg one p.o. q.d. x 5 more days.  5. Humibid LA 600 mg p.o. b.i.d. x 10 more days.  6. Combivent MDI two puffs t.i.d.  7. Aspirin, enteric coated, 325 mg once daily.  8. Fosamax as before.  9. Allegra as before. 10. Vioxx as before. 11. Hydrocodone as before.  ACTIVITY:  Per instructions provided by the cardiac rehabilitation nurse.  DIET:  Low fat (less than 60 g daily), low cholesterol (less than 300 mg).  SPECIAL INSTRUCTIONS:  Come to the office on August 03, 2001, for  staple removal.  FOLLOW-UP:  The patient is to call our clinic at [phone] for follow-up office visit to be seen in two weeks post discharge.  BRIEF HOSPITAL COURSE: #1 - SYNCOPE SECONDARY TO WIDE COMPLEX TACHYCARDIA/VENTRICULAR TACHYCARDIA: One week prior to admission, episode of lightheadedness; current episode the day prior to admission without leading to complete loss of consciousness causing injury to her right elbow with fall.  Later in the day, repeat episode preceded by prodrome of lightheadedness.  Fell with resulting head laceration. Arrived in the ER.  Had episodes of supraventricular tachycardia lasting approximately 50 seconds, subsequently converting to atrial fibrillation, rate 150, 2:1 block.  No further recurrent episodes of ventricular tachycardia during the course of admission.  EPS consult obtained from Armanda Magic, M.D.; subsequent EPS study with no inducible arrhythmias.  Ventricular tachycardia thought secondary to ischemia.  #2 - CORONARY ATHEROSCLEROTIC HEART DISEASE:  Interestingly the patient was without symptoms of anterior chest pain nor discomfort.  CK 455, MB fraction 5, relative index 1.1, troponin I 0.03.  The EKG revealed ST segment depression lateral precordially to 2-3 mm when in SVT with rapid ventricular response while in the emergency room.  On July 24, 2001, she underwent coronary angiography by Francisca December, M.D., revealing diffuse hypokinesis, EF 45-50%, and 1+ MR with right and left coronary calcification.  Left main:  20-30%.  LAD:  Ostial 30-40%, proximal 30-40%, mid distal 40-50%. Circumflex:  First large marginal 80-90% tubular.  Ongoing circumflex small. RCA:  30% mid, 50% distal, 30% large  PLD, 30% origin PDA (large).  The patient underwent successful PTCA/stent of the OM1.  Initiated on and completed 18 hours of IV Integrilin.  #3 - LEFT VENTRICULAR DYSFUNCTION:  Likely ischemic.  EF 45-50% and diffuse hypokinesis.  #4 - CHRONIC  OBSTRUCTIVE PULMONARY DISEASE/BRONCHITIS:  PFTs revealing severe obstructive disease; probable air trapping.  Significant pre and post bronchodilator changes in FEV1 and FEVF 25-75%.  Bronchitis was treated with Tequin which continued for five days post discharge.  Humibid and Combivent were added to medical management.  #5 - HYPOKALEMIA:  Serum potassium of 2.8; supplemented.  It was 4.1 at the time of discharge.  #6 - HYPERTENSION:  Medications adjusted (Altace and Norvasc added to medical regimen).  Blood pressure running 130/60 at the time of discharge.  #7 - SCALP LACERATION SECONDARY TO FALL:  Wound approximated with staples and these will be removed in follow-up office visit on August 03, 2001.  #8 - CONGESTIVE HEART FAILURE:  Secondary to ischemic LV dysfunction. Resolved with Lasix and ACE inhibitor.  LABORATORY TESTS AND DATA:  Admission WBC 6.6, hemoglobin 11.8; 13.7 at the time of discharge.  Hematocrit 33.4, platelets 211.  Admission coagulation times within normal range.  Sodium 132, K low at 2.8; supplemented.  It was 4.1 at the time of discharge.  Chloride 90, CO2 28, glucose 161; 180 at the time of discharge.  BUN 7, creatinine 0.8, calcium 8.0.  CK 455, MB fraction 5, troponin I 0.03.  Head CT without contrast revealed chronic atrophy, small vessel changes, and no acute findings.  Chest x-ray revealed COPD, question of pleural thickening versus small pleural effusions, questionable nondisplaced fracture of the left posterolateral seventh rib.  The initial electrocardiogram revealed sinus rhythm with slight ST depression in V4, V5, and V6, approximately 1 mm.  EKG at 6 a.m. on July 24, 2001, revealed atrial flutter with 2:1 block and ST segment depression in the lateral precordial leads of 2-3 mm.  Please note that the total time preparing the discharge was greater than 30 minutes, including filling out and review of discharge instructions with the patient and  dictating the discharge summary.  Dictated by:   Anselm Lis, N.P. Attending Physician:  Corliss Marcus DD:  09/10/01 TD:  09/10/01 Job: [id number]

## 2011-07-05 ENCOUNTER — Other Ambulatory Visit: Payer: Self-pay | Admitting: Gastroenterology

## 2011-07-05 MED ORDER — PREDNISONE 5 MG PO TABS: 2.5000 mg | ORAL_TABLET | Freq: Two times a day (BID) | ORAL | Status: DC

## 2011-07-05 NOTE — Telephone Encounter (Signed)
Pt daughter  is aware that the rx has been sent

## 2011-07-08 ENCOUNTER — Telehealth: Payer: Self-pay | Admitting: Gastroenterology

## 2011-07-08 NOTE — Telephone Encounter (Signed)
pt son is calling to report that his mother is having frequent episodes of colitis flares.  She increases her prednisone and gets better for a couple months and then the flare will return.  He wants to see about putting her on some other med, he says she is only 92 pounds and has a hard time when she has the diarrhea.  Pt scheduled for 07/09/11 9 am with Dr Christella Hartigan

## 2011-07-09 ENCOUNTER — Ambulatory Visit: Payer: Medicare Other | Admitting: Gastroenterology

## 2011-07-09 ENCOUNTER — Ambulatory Visit (INDEPENDENT_AMBULATORY_CARE_PROVIDER_SITE_OTHER): Payer: Medicare Other | Admitting: Gastroenterology

## 2011-07-09 ENCOUNTER — Encounter: Payer: Self-pay | Admitting: Gastroenterology

## 2011-07-09 ENCOUNTER — Other Ambulatory Visit (INDEPENDENT_AMBULATORY_CARE_PROVIDER_SITE_OTHER): Payer: Medicare Other

## 2011-07-09 VITALS — BP 120/82 | HR 68 | Ht 63.0 in | Wt 84.6 lb

## 2011-07-09 DIAGNOSIS — K5289 Other specified noninfective gastroenteritis and colitis: Secondary | ICD-10-CM

## 2011-07-09 DIAGNOSIS — K529 Noninfective gastroenteritis and colitis, unspecified: Secondary | ICD-10-CM

## 2011-07-09 LAB — BASIC METABOLIC PANEL
CO2: 29 mEq/L (ref 19–32)
Calcium: 9.2 mg/dL (ref 8.4–10.5)
Chloride: 102 mEq/L (ref 96–112)
Sodium: 140 mEq/L (ref 135–145)

## 2011-07-09 LAB — CBC WITH DIFFERENTIAL/PLATELET
Basophils Relative: 0 % (ref 0.0–3.0)
Eosinophils Absolute: 0 10*3/uL (ref 0.0–0.7)
Eosinophils Relative: 0.4 % (ref 0.0–5.0)
Hemoglobin: 13.4 g/dL (ref 12.0–15.0)
Lymphocytes Relative: 12.1 % (ref 12.0–46.0)
MCHC: 34 g/dL (ref 30.0–36.0)
MCV: 97.6 fl (ref 78.0–100.0)
Neutro Abs: 7.5 10*3/uL (ref 1.4–7.7)
RBC: 4.02 Mil/uL (ref 3.87–5.11)
WBC: 9.2 10*3/uL (ref 4.5–10.5)

## 2011-07-09 MED ORDER — BUDESONIDE 3 MG PO CP24: 9.0000 mg | ORAL_CAPSULE | Freq: Two times a day (BID) | ORAL | Status: DC

## 2011-07-09 NOTE — Progress Notes (Signed)
Review of gastrointestinal problems:  1. Collagenous colitis. Presented June 2009 with perfuse diarrhea. Underwent colonoscopy early July 2009 essentially normal examination but random colon biopsies showed collagenous colitis. Responded fairly quickly to budesonide 3 pills a day. Also suffered some more than typical scope trauma during colonoscopy and was admitted with prophylactic antibiotics and observation. She was discharged without problems 48 hours later. She switched to low-dose prednisone do to prohibitive cost of Entocort. May, 2010 she has found that 5 mg a day of prednisone does not hold her symptoms however 10 mg of prednisone a day keeps her diarrhea completely controlled. February, 2011: had tapered down prednisone to 2.5 milligrams every other day and started to flare. Increased prednisone to 15 mg a day with dramatic, complete response.  March, 2012 was taking prednisone 10 mg every day, began to taper.   HPI: This is a very pleasant 77 year old woman whom I last saw about 4 months ago. She is here with her son today.  Whom I last saw 4 months ago.  Was down to 7.5mg  prednisone and within about 2-3 months she started having non-bloody diarrhea.  Has been on 30mg  a day for 1 week now, not noticed much difference.  No recent antibiotics.  Is on 2 immodium.    Past Medical History:   Colitis                                                      Weight loss                                                  Diarrhea                                                     CAD (coronary artery disease)                                Hyperlipidemia                                               Hypertension                                                 Asthma                                                       GERD (gastroesophageal reflux disease)                      Past Surgical History:   HERNIA  REPAIR                                                CORONARY ANGIOPLASTY WITH STENT  PLACEMENT                    reports that she has quit smoking. She has never used smokeless tobacco. She reports that she does not drink alcohol or use illicit drugs.  family history includes Colon cancer in her father.    Current medicines and allergies were reviewed in [place] [facility]    Physical Exam: BP 120/82  Pulse 68  Ht 5\' 3"  (1.6 m)  Wt 84 lb 9.6 oz (38.374 kg)  BMI 14.99 kg/m2 Constitutional: Very frail, thin. She is always very thin but more so today Psychiatric: alert and oriented x3 Abdomen: soft, nontender, nondistended, no obvious ascites, no peritoneal signs, normal bowel sounds     Assessment and plan: 77 y.o. female with collagenous colitis  I am going to change her from prednisone to budesonide, 3 pills a day. She will also increase her Imodium 2 pills twice daily. She needs a set of basic labs check her electrolytes. She will return to see me in 2-3 weeks and sooner if needed.

## 2011-07-09 NOTE — Patient Instructions (Signed)
Start immodium 2 pills, twice a day.  Stop or cut back if you become constipated. Stop prednisone and start budesonide 3 pills once daily. Return to see Dr. Christella Hartigan in 2-3 weeks. You will have labs checked today in the basement lab.  Please head down after you check out with the front desk  (bmet, cbc). A copy of this information will be made available to Dr Tenny Craw.

## 2011-07-10 ENCOUNTER — Inpatient Hospital Stay (HOSPITAL_COMMUNITY)
Admission: EM | Admit: 2011-07-10 | Discharge: 2011-07-16 | DRG: 389 | Disposition: A | Discharge status: Home / Self Care | Payer: Medicare Other | Attending: Family Medicine | Admitting: Family Medicine

## 2011-07-10 ENCOUNTER — Emergency Department (HOSPITAL_COMMUNITY): Payer: Medicare Other

## 2011-07-10 ENCOUNTER — Other Ambulatory Visit: Payer: Self-pay

## 2011-07-10 DIAGNOSIS — R197 Diarrhea, unspecified: Secondary | ICD-10-CM

## 2011-07-10 DIAGNOSIS — K5289 Other specified noninfective gastroenteritis and colitis: Secondary | ICD-10-CM | POA: Diagnosis present

## 2011-07-10 DIAGNOSIS — F3289 Other specified depressive episodes: Secondary | ICD-10-CM | POA: Diagnosis present

## 2011-07-10 DIAGNOSIS — R112 Nausea with vomiting, unspecified: Secondary | ICD-10-CM | POA: Diagnosis present

## 2011-07-10 DIAGNOSIS — E876 Hypokalemia: Secondary | ICD-10-CM | POA: Diagnosis present

## 2011-07-10 DIAGNOSIS — K56609 Unspecified intestinal obstruction, unspecified as to partial versus complete obstruction: Secondary | ICD-10-CM

## 2011-07-10 DIAGNOSIS — I10 Essential (primary) hypertension: Secondary | ICD-10-CM | POA: Diagnosis present

## 2011-07-10 DIAGNOSIS — J4489 Other specified chronic obstructive pulmonary disease: Secondary | ICD-10-CM | POA: Diagnosis present

## 2011-07-10 DIAGNOSIS — F329 Major depressive disorder, single episode, unspecified: Secondary | ICD-10-CM | POA: Diagnosis present

## 2011-07-10 DIAGNOSIS — M8448XA Pathological fracture, other site, initial encounter for fracture: Secondary | ICD-10-CM | POA: Diagnosis present

## 2011-07-10 DIAGNOSIS — R109 Unspecified abdominal pain: Secondary | ICD-10-CM

## 2011-07-10 DIAGNOSIS — IMO0002 Reserved for concepts with insufficient information to code with codable children: Secondary | ICD-10-CM

## 2011-07-10 DIAGNOSIS — J449 Chronic obstructive pulmonary disease, unspecified: Secondary | ICD-10-CM | POA: Diagnosis present

## 2011-07-10 DIAGNOSIS — I2510 Atherosclerotic heart disease of native coronary artery without angina pectoris: Secondary | ICD-10-CM | POA: Diagnosis present

## 2011-07-10 DIAGNOSIS — E871 Hypo-osmolality and hyponatremia: Secondary | ICD-10-CM | POA: Diagnosis present

## 2011-07-10 DIAGNOSIS — K529 Noninfective gastroenteritis and colitis, unspecified: Secondary | ICD-10-CM

## 2011-07-10 LAB — CBC
MCH: 31.8 pg (ref 26.0–34.0)
MCV: 94 fL (ref 78.0–100.0)
Platelets: 234 10*3/uL (ref 150–400)
RBC: 4.34 MIL/uL (ref 3.87–5.11)
RDW: 13.3 % (ref 11.5–15.5)
WBC: 11.3 10*3/uL — ABNORMAL HIGH (ref 4.0–10.5)

## 2011-07-10 LAB — DIFFERENTIAL
Basophils Relative: 0 % (ref 0–1)
Eosinophils Absolute: 0.1 10*3/uL (ref 0.0–0.7)
Eosinophils Relative: 1 % (ref 0–5)
Lymphs Abs: 1.1 10*3/uL (ref 0.7–4.0)
Monocytes Relative: 7 % (ref 3–12)
Neutrophils Relative %: 82 % — ABNORMAL HIGH (ref 43–77)

## 2011-07-10 LAB — COMPREHENSIVE METABOLIC PANEL
ALT: 23 U/L (ref 0–35)
AST: 25 U/L (ref 0–37)
Albumin: 3.7 g/dL (ref 3.5–5.2)
CO2: 27 mEq/L (ref 19–32)
Calcium: 9.5 mg/dL (ref 8.4–10.5)
Creatinine, Ser: 0.64 mg/dL (ref 0.50–1.10)
Sodium: 131 mEq/L — ABNORMAL LOW (ref 135–145)
Total Protein: 6.3 g/dL (ref 6.0–8.3)

## 2011-07-10 MED ORDER — POTASSIUM CHLORIDE 10 MEQ PO CPCR: 10.0000 meq | ORAL_CAPSULE | Freq: Every day | ORAL | Status: DC

## 2011-07-11 ENCOUNTER — Inpatient Hospital Stay (HOSPITAL_COMMUNITY): Payer: Medicare Other

## 2011-07-11 DIAGNOSIS — K565 Intestinal adhesions [bands], unspecified as to partial versus complete obstruction: Secondary | ICD-10-CM

## 2011-07-11 DIAGNOSIS — R933 Abnormal findings on diagnostic imaging of other parts of digestive tract: Secondary | ICD-10-CM

## 2011-07-11 DIAGNOSIS — R1084 Generalized abdominal pain: Secondary | ICD-10-CM

## 2011-07-11 LAB — CBC
HCT: 36.6 % (ref 36.0–46.0)
MCH: 31.7 pg (ref 26.0–34.0)
MCV: 95.1 fL (ref 78.0–100.0)
RDW: 13.8 % (ref 11.5–15.5)
WBC: 8.6 10*3/uL (ref 4.0–10.5)

## 2011-07-11 LAB — CARDIAC PANEL(CRET KIN+CKTOT+MB+TROPI)
CK, MB: 3.3 ng/mL (ref 0.3–4.0)
CK, MB: 3.4 ng/mL (ref 0.3–4.0)
Relative Index: INVALID (ref 0.0–2.5)
Relative Index: INVALID (ref 0.0–2.5)
Total CK: 53 U/L (ref 7–177)
Total CK: 47 U/L (ref 7–177)
Troponin I: <0.3 ng/mL (ref <0.30)
Troponin I: <0.3 ng/mL (ref <0.30)

## 2011-07-11 LAB — BASIC METABOLIC PANEL
BUN: 16 mg/dL (ref 6–23)
CO2: 28 mEq/L (ref 19–32)
Calcium: 8.7 mg/dL (ref 8.4–10.5)
Chloride: 99 mEq/L (ref 96–112)
Creatinine, Ser: 0.68 mg/dL (ref 0.50–1.10)
GFR calc Af Amer: >60 mL/min (ref >60)
GFR calc non Af Amer: >60 mL/min (ref >60)
Glucose, Bld: 123 mg/dL — ABNORMAL HIGH (ref 70–99)
Potassium: 3.9 mEq/L (ref 3.5–5.1)
Sodium: 134 mEq/L — ABNORMAL LOW (ref 135–145)

## 2011-07-11 LAB — MAGNESIUM: Magnesium: 1.9 mg/dL (ref 1.5–2.5)

## 2011-07-11 LAB — PHOSPHORUS: Phosphorus: 4.3 mg/dL (ref 2.3–4.6)

## 2011-07-11 MED ORDER — IOHEXOL 300 MG/ML  SOLN
100.0000 mL | Freq: Once | INTRAMUSCULAR | Status: AC | PRN
  Administered 2011-07-11: 100 mL via INTRAVENOUS

## 2011-07-12 ENCOUNTER — Inpatient Hospital Stay (HOSPITAL_COMMUNITY): Payer: Medicare Other

## 2011-07-12 LAB — CBC
HCT: 36.9 % (ref 36.0–46.0)
Hemoglobin: 12.8 g/dL (ref 12.0–15.0)
MCH: 32.8 pg (ref 26.0–34.0)
MCHC: 34.7 g/dL (ref 30.0–36.0)
RDW: 14.2 % (ref 11.5–15.5)

## 2011-07-12 LAB — URINE CULTURE
Colony Count: NO GROWTH
Culture  Setup Time: 201207120911
Culture: NO GROWTH
Special Requests: NEGATIVE

## 2011-07-12 LAB — BASIC METABOLIC PANEL
BUN: 17 mg/dL (ref 6–23)
Creatinine, Ser: 0.75 mg/dL (ref 0.50–1.10)
GFR calc non Af Amer: >60 mL/min (ref >60)
Glucose, Bld: 92 mg/dL (ref 70–99)
Potassium: 4.1 mEq/L (ref 3.5–5.1)

## 2011-07-13 ENCOUNTER — Inpatient Hospital Stay (HOSPITAL_COMMUNITY): Payer: Medicare Other

## 2011-07-13 DIAGNOSIS — K56609 Unspecified intestinal obstruction, unspecified as to partial versus complete obstruction: Secondary | ICD-10-CM

## 2011-07-13 LAB — CBC
Hemoglobin: 12 g/dL (ref 12.0–15.0)
RBC: 3.77 MIL/uL — ABNORMAL LOW (ref 3.87–5.11)

## 2011-07-13 LAB — COMPREHENSIVE METABOLIC PANEL
ALT: 14 U/L (ref 0–35)
Alkaline Phosphatase: 22 U/L — ABNORMAL LOW (ref 39–117)
CO2: 23 mEq/L (ref 19–32)
Chloride: 105 mEq/L (ref 96–112)
GFR calc Af Amer: >60 mL/min (ref >60)
GFR calc non Af Amer: >60 mL/min (ref >60)
Glucose, Bld: 73 mg/dL (ref 70–99)
Potassium: 3.9 mEq/L (ref 3.5–5.1)
Sodium: 137 mEq/L (ref 135–145)
Total Bilirubin: 0.5 mg/dL (ref 0.3–1.2)

## 2011-07-14 ENCOUNTER — Inpatient Hospital Stay (HOSPITAL_COMMUNITY): Payer: Medicare Other

## 2011-07-14 DIAGNOSIS — K56609 Unspecified intestinal obstruction, unspecified as to partial versus complete obstruction: Secondary | ICD-10-CM

## 2011-07-14 NOTE — H&P (Signed)
NAMEBRALYNN, Ariel NO.:  [id number]  MEDICAL RECORD NO.:  [id number]  LOCATION:  [facility]                         FACILITY:  [facility]  PHYSICIAN:  Celso Amy, MD   DATE OF BIRTH:  [birth year]  DATE OF ADMISSION:  07/10/2011 DATE OF DISCHARGE:                             HISTORY & PHYSICAL   PRIMARY CARE PHYSICIAN:  Hessie Diener (C.Alan) Tenny Craw, MD  PRIMARY GASTROENTEROLOGIST:  Rachael Fee, MD  CHIEF COMPLAINT:  Vomiting.  HISTORY OF PRESENT ILLNESS:  The patient is an 77 year old white female with a past medical history of coronary artery disease who presented to ER with chief complaint of vomiting.  History of present illness dates back to this a.m. when the patient started having sudden onset of emesis.  The patient has had numerous episodes of emesis.  No complaint of blood in the vomitus.  Complains of nausea.  Complains of abdominal pain lower to umbilicus, 4 to 6 out of 10 in intensity and spasmodic in nature.  The patient complains of bloatedness.  No complaint of chest pain, shortness of breath.  No complaint of fever or cough, but does complain of chills.  No complaint of syncope.  No complaint of falls.  REVIEW OF SYSTEMS:  Positive for diarrhea.  The patient had diarrhea 3 weeks back, for which she went to her GI, and her medication was changed, and now her diarrhea is better.  ALLERGIES:  No known drug allergies.  FAMILY HISTORY:  Mother died at the age of 27 from old age.  Father died from colon cancer.  SOCIAL HISTORY:  The patient lives alone.  She is nonsmoker, nondrinker.  PAST MEDICAL HISTORY:  Positive for; 1. Coronary artery disease status post PTA. 2. COPD. 3. Microscopic colitis. 4. Hypertension. 5. History of recurrent abdominal obstruction. 6. Status post adhesion lysing in 2006 for SBO. 7. Status post hysterectomy. 8. Status post repair of incarcerated hernia.  MEDICATIONS:  As an outpatient, the patient is on; 1.  Budesonide 3 mg tablet, but total dose of 9 mg p.o. daily. 2. Advair Diskus 100/50 b.i.d. 3. Pravachol 80 mg p.o. b.i.d. 4. Lexapro 10 mg p.o. q.h.s. 5. Calcium with vitamin D 250/125 p.o. b.i.d. 6. Multivitamin 1 tablet p.o. daily. 7. Aspirin 81 mg p.o. daily. 8. Tramadol 50 mg as needed. 9. Tylenol 500 mg as needed.  PHYSICAL EXAMINATION:  VITAL SIGNS:  Blood pressure 133/74, pulse 76, respiratory rate 14, temperature afebrile. GENERAL:  The patient is lethargic but easily arousable, cachectic, follows commands.  Is petite. HEENT:  Head is atraumatic, normocephalic.  Pupils equally reactive to light and accommodation.  Extraocular movements are intact.  Oral mucosa has dry membranes. NECK:  Supple. RESPIRATORY:  No acute respiratory distress. CHEST:  Clear to auscultation bilaterally. CARDIOVASCULAR:  S1, S2.  Regular in rate and rhythm.  No murmurs were appreciated. GI:  Bowel sounds are present but very much decreased.  Her abdomen is distended.  Liver is soft.  No rebound tenderness. EXTREMITIES:  No lower extremity edema.  No cyanosis, but the patient does have multiple bruises on the lower extremities. CNS:  Cranial nerves II through XII are grossly intact.  The patient is moving all 4 extremities. PSYCH:  The patient is tired, but has normal mood and affect.  LABORATORY DATA:  Sodium 131, potassium is 2.9, serum creatinine 94. The patient has hematocrit of 27, BUN 18, serum creatinine 0.6, glucose 134.  The patient's hemoglobin is 13.8.  The patient's WBC is 11.3, platelets 234.  ALP 30, AST 25, ALT 23, albumin 3.7, calcium 9.5.  The patient's sodium has gone down from 140 to 131.  The patient's abdominal x-ray shows recurrent small-bowel obstruction.  IMPRESSION: 1. GI- The patient is admitted with small bowel obstruction.     The patient had typical clinical symptoms for the same.  The     patient has had multiple episodes in the past. 2. Fluids, electrolytes and  nutrition.  The patient is hyponatremic,     hypokalemic, hypovolemic. 3. Respiratory:  The patient has history of COPD, she is right now     stable. 4. Cardiovascular:  The patient has history of coronary artery     disease, is right now chest pain free. 5. Social.  The patient is DNR.  PLAN: 1. Admit to tele. 2. Cycle trops. 3. We will keep the patient n.p.o. 4. We will start the patient on IV fluids. 5. We will give potassium and IV fluids. 6. We will follow Surgery recommendations, it has already been called     by the ER. 7. We will give the patient IV Solu-Medrol, as the patient has been on     steroids. 8. We will keep the patient on IV Cipro and IV Flagyl as the patient     has history of diarrhea. 9. We will ask for blood cultures and urine cultures. 10.The patient's further clinical course depends how she does with     this treatment.     Celso Amy, MD     MB/MEDQ  D:  07/10/2011  T:  07/10/2011  Job:  [id number]  Electronically Signed by Celso Amy M.D. on 07/14/2011 12:17:20 PM

## 2011-07-18 NOTE — Discharge Summary (Addendum)
Ariel Hunt, Ariel Hunt                  ACCOUNT NO.:  [id number]  MEDICAL RECORD NO.:  [id number]  LOCATION:  1406                         FACILITY:  [facility]  PHYSICIAN:  Pleas Koch, MD        DATE OF BIRTH:  [birth year]  DATE OF ADMISSION:  07/10/2011 DATE OF DISCHARGE:  07/16/2011                        DISCHARGE SUMMARY - REFERRING   PRIMARY CARE PROVIDER:  Hessie Diener (Valla Leaver) Tenny Craw, M.D.  PRIMARY GASTROENTEROLOGIST:  Rachael Fee, MD  DISCHARGE DIAGNOSES: 1. Small-bowel obstruction. 2. Collagenous colitis. 3. Coronary artery disease. 4. Chronic obstructive pulmonary disease. 5. Hypertension. 6. Depression. 7. T12 compression fracture.  DISCHARGE MEDICATIONS: 1. Advair Diskus 1 puff 100/50 b.i.d. 2. Budesonide 3 mg 3 tablets p.o. b.i.d. 3. Citracal Plus D 1 tablet p.o. b.i.d. 4. Ecotrin 81 mg p.o. daily. 5. Hydrocodone/APAP 5/500, one tablet p.o. every 6 hours as needed. 6. Lexapro 10 mg 1/2 tablet p.o. daily at bedtime. 7. Losartan 50 mg p.o. daily. 8. MiraLAX 17 g p.o. daily as needed for constipation. 9. Multivitamins 1 tablet p.o. every evening. 10.Potassium chloride 10 mEq p.o. daily. 11.Pravastatin 40 mg p.o. every evening. 12.Refresh 1 drop both eyes 3 times a day as needed. 13.Tramadol 50 mg p.o. b.i.d. as needed for pain.  DIAGNOSTIC LABS:  Sodium 140, potassium 3.1, chloride 102, CO2 of 29, BUN 14, creatinine 0.8, glucose 89.  WBC is 9.2, hemoglobin 13.4, hematocrit 39.3, platelets 231, neutrophils 81%.  Total CK 53, CK-MB 3.3.  Troponin I less than 0.30.  Second set, total CK 47, CK-MB 3.4. Troponin I less than 0.30.  Magnesium 1.9, phosphorus 3.4, vitamin D 25, hydroxy 70.  Urine culture shows no growth to date.  DIAGNOSTIC IMAGING: 1. On July 11, an acute abdominal x-ray yields recurrent small-bowel     obstruction. 2. On July 12, CT of the abdomen and pelvis yields mid small-bowel     obstruction with transition point seen in the central pelvic small  bowel mesentry, most likely due to an adhesion.  T12 vertebral body     compression fracture which is new since exam on June 04, 2011. 3. Abdominal x-ray on July 14 yields improvement in small-bowel     obstruction pattern. 4. Abdominal x-ray on July 15 yields minimal change in abdominal bowel     gas pattern.  Continues to be few dilated loops of small bowel but     there is also gas and contrast in colon. 5. On July 16, abdominal x-ray yields persistent dilatation of small     bowel loops measuring up to 3.5 cm.  Small-bowel obstruction     remains of concern.  CONSULTATIONS: 1. Dr. Velora Heckler, MD with [facility] Surgery. 2. Judie Petit T. Russella Dar, MD, [facility] with Gastroenterology.  PROCEDURES:  None.  BRIEF HISTORY:  [name] is an 77 year old female with past medical history for coronary artery disease who presented to the emergency room at [facility] on July 11 with a chief complaint of vomiting.  History of present illness dated back to early that morning when the patient started having sudden onset of emesis.  The patient reported numerous episodes of emesis.  There was no report  of blood in the vomitus.  She did complain of nausea.  She also complained of abdominal pain, lower to umbilicus, rated at 4-6 out of 10 in intensity and spasmodic in nature. The patient also complained of bloatedness.  No complaint of chest pain, shortness of breath.  No complaint of fever or cough.  The patient did indicate an episode of diarrhea 3 weeks prior for which she saw her GI doctor and indicated that her medication was changed and her diarrhea had improved.  Workup in the emergency room yields a small-bowel obstruction.  She was admitted to [facility] for further evaluation and treatment.  She was seen in the Emergency Room by Surgery.  HOSPITAL COURSE: 1. Small-bowel obstruction.  The patient was seen by Surgery in the     emergency room based on clinical symptoms and abdominal  x-rays as     stated above.  She did give an NG tube on the 12th.  Every time,     the obstruction gradually resolved.  Her abdomen was decompressed     and NG was removed on July 15.  She was also made n.p.o. and     provided with IV hydration.  By July 16, the patient was tolerating     full liquids and having flatulence.  By July 17, the patient was     tolerating a full liquid diet.  No abdominal pain, ambulating in     hall but not had a BM.  She was provided with a Dulcolax     suppository with very good results.  The patient does have a     history of recurrent partial small-bowel obstruction.  It was     recommended by Surgery that she take her MiraLAX for constipation. 2. Collagenous colitis.  Is seen by Dr. Christella Hartigan with Gastroenterology.     Initially on her admission, her budesonide was held that was     restarted and will be continued at discharge. 3. History of CAD.  The patient experienced no chest pain.  No EKG     changes.  She is on aspirin which was continued and will be     continued at discharge. 4. History of COPD, was stable during this hospitalization.  She was     continued on albuterol and Advair.5. Hypertension, fair control.  At admission, the patient's     antihypertensives were held.  Her losartan was restarted on the     15th. 6. Depression, remained at baseline.  Her medications were restarted     once her NG tube came out. 7. T12 compression fracture.  Pain management.  The patient may need     outpatient followup.  Able to ambulate in the hall unassisted.     Gait steady.  PHYSICAL EXAMINATION:  VITAL SIGNS:  Temperature 97.6, blood pressure 139/78, heart rate 89, respirations 18, sats 97% on room air. GENERAL:  Ambulating in hall, gait steady, smiling. CV:  Regular rate and rhythm.  No murmur, gallop or rub.  No lower extremity edema. RESPIRATORY:  Normal effort.  Breath sounds clear to auscultation bilaterally.  No rhonchi, wheezes or  rales. ABDOMEN:  Flat, soft.  Positive bowel sounds were heard.  Nontender to palpitation. NEUROLOGIC:  Alert and oriented x3.  Speech clear.  Facial asymmetry.  FOLLOWUP:  The patient to see her primary care provider, Dr. Tenny Craw in 2-3 weeks.  Follow up with her gastroenterologist, Dr. Christella Hartigan as needed. The patient already has scheduled an  appointment.  DIET:  regular.  ACTIVITY:  As tolerated.  DISPOSITION:  The patient is medically stable and ready for discharge home.  TIME SPENT:  Time spent on this discharge is 35 minutes.     Gwenyth Bender, NP   ______________________________ Pleas Koch, MD    KMB/MEDQ  D:  07/16/2011  T:  07/16/2011  Job:  [id number]  cc:   Magnus Sinning) Tenny Craw, M.D. Fax: [phone]  Rachael Fee, MD [address] [place], [place] [address]  Electronically Signed by Toya Smothers  on 07/18/2011 04:15:27 PM Electronically Signed by Venita Lick MD on 08/01/2011 11:26:47 AM

## 2011-07-20 NOTE — Consult Note (Signed)
Ariel Hunt, Ariel Hunt NO.:  [id number]  MEDICAL RECORD NO.:  [id number]  LOCATION:  WLED                         FACILITY:  [facility]  PHYSICIAN:  Lodema Pilot, MD       DATE OF BIRTH:  1923-01-09  DATE OF CONSULTATION:  07/10/2011 DATE OF DISCHARGE:                                CONSULTATION   CHIEF COMPLAINT:  Diarrhea and abdominal pain.  HISTORY OF PRESENT ILLNESS:  Ariel Hunt is an 77 year old female with a history of collagenous colitis which is normally treated with prednisone by her primary gastroenterologist Dr. Christella Hartigan.  She reports having diarrhea over the last 2 weeks which is abnormal for her and so she visited with Dr. Christella Hartigan yesterday who changed her steroid management to budesonide.  She states that she was feeling good after starting the medication until this morning when she started having generalized abdominal pain across her mid abdomen which she describes as crampiness. After she awoke this morning with the pain, she began having vomiting and has been unable to keep any liquids down.  She had a history of bowel obstruction with similar episode almost exactly 1 year ago which was treated with nonoperative management.  She does also have a history of a previous bowel obstruction which did require surgical intervention and a bowel resection.  She states that normally she has occasional bloating and gassiness but she denies any blood in the stools or melena. She denies any associated fevers or hematemesis.  She states that her bowel movement was 2 days ago, her last flatus was yesterday.  She reports having a colonoscopy last year which was complicated with perforation requiring hospitalization, did not require any surgery.  ALLERGIES:  None.  MEDICATIONS:  Please refer to home medication list.  PAST MEDICAL HISTORY:  Significant for collagenous colitis, coronary artery disease and several small myocardial infarctions several years ago,  osteoporosis, hypertension, COPD.  PAST SURGICAL HISTORY:  Exploratory laparotomy with bowel resection or prior bowel obstruction, open appendectomy, oophorectomy cardiac stent.  SOCIAL HISTORY:  She has a remote history of smoking but quit many years ago.  Denies any alcohol and she lives alone and is otherwise independent, though her daughter is present with her today and contributes to history.  PHYSICAL EXAM:  GENERAL:  She is in no acute distress and nontoxic appearing.  Alert and oriented x3. VITAL SIGNS:  Her temperature is 98.2, heart rate 70, blood pressure 153/68, respirations 16. HEENT:  Her head is normocephalic and atraumatic.  Sclerae white, no icterus and extraocular muscles intact.  Trachea is midline.  No stridor. NECK:  Supple.  No masses. LUNGS:  Clear to auscultation bilaterally without any wheezes or rales. HEART:  Her heart rate is normal and regular rhythm.  She does have an audible murmur. ABDOMEN:  Her abdomen is soft, minimally tender diffusely.  She has moderate amount of distention and tympany and well-healed surgicalscars, 2 transverse scars in the lower abdomen and a midline scar.  No evidence of hernias at the incisions or in the groins bilaterally.  No evidence of peritonitis.  Pulses are palpable and equal bilaterally. SKIN:  Her skin shows multiple  areas of bruising and ecchymosis.  LABORATORY STUDIES:  Laboratory studies showed a white count 11.3, hemoglobin of 13.8, hematocrit 40.8, platelets 234, 82% neutrophils. Sodium is 131, potassium 2.9, chloride 94, CO2 of 27, glucose 134, BUN 18, creatinine 0.64, bilirubin 0.6, alk phos 30, AST is 25, ALT 23.  RADIOGRAPHIC STUDIES:  Acute abdominal series demonstrates moderate amount of small bowel distention with some air-fluid levels but nonspecific obstructive pattern.  There is no evidence of free air. This is likely consistent with partial small bowel obstruction.  ASSESSMENT:  Abdominal pain and  distention which is most likely recurrent partial small bowel obstruction  RECOMMENDATIONS:  This appears to be a partial small bowel obstruction. She has a history of recurrent partial small bowel obstruction.  I recommend admission with continued n.p.o. status and IV hydration, Foley catheter and placement of NG tube for decompression.  If this is due to adhesive disease, there is a good chance that this will resolve with operative management similar to her bowel obstruction last year.  I will consider CT scan of the abdomen with oral contrast to evaluate her bowel obstruction and this may be beneficial in the upcoming days if she does not improve with nonoperative management.  General surgery will follow along while she is in the hospital for surgical recommendations.          ______________________________ Lodema Pilot, MD     BL/MEDQ  D:  07/11/2011  T:  07/11/2011  Job:  [id number]  Electronically Signed by Lodema Pilot DO on 07/20/2011 10:34:22 PM

## 2011-08-12 ENCOUNTER — Telehealth: Payer: Self-pay

## 2011-08-12 NOTE — Telephone Encounter (Signed)
Pt called and reminded to have labs  

## 2011-08-12 NOTE — Telephone Encounter (Signed)
Message copied by Donata Duff on Mon Aug 12, 2011  1:02 PM ------      Message from: Donata Duff      Created: Wed Jul 10, 2011  9:20 AM       Pt to get labs

## 2011-08-13 ENCOUNTER — Other Ambulatory Visit (INDEPENDENT_AMBULATORY_CARE_PROVIDER_SITE_OTHER): Payer: Medicare Other

## 2011-08-13 ENCOUNTER — Other Ambulatory Visit: Payer: Self-pay | Admitting: Gastroenterology

## 2011-08-13 ENCOUNTER — Other Ambulatory Visit: Payer: Self-pay

## 2011-08-13 ENCOUNTER — Ambulatory Visit (INDEPENDENT_AMBULATORY_CARE_PROVIDER_SITE_OTHER): Payer: Medicare Other | Admitting: Gastroenterology

## 2011-08-13 ENCOUNTER — Encounter: Payer: Self-pay | Admitting: Gastroenterology

## 2011-08-13 DIAGNOSIS — E876 Hypokalemia: Secondary | ICD-10-CM

## 2011-08-13 DIAGNOSIS — K529 Noninfective gastroenteritis and colitis, unspecified: Secondary | ICD-10-CM

## 2011-08-13 DIAGNOSIS — K5289 Other specified noninfective gastroenteritis and colitis: Secondary | ICD-10-CM

## 2011-08-13 LAB — BASIC METABOLIC PANEL
CO2: 29 mEq/L (ref 19–32)
Calcium: 8.7 mg/dL (ref 8.4–10.5)
Creatinine, Ser: 0.7 mg/dL (ref 0.4–1.2)

## 2011-08-13 MED ORDER — POTASSIUM CHLORIDE 10 MEQ PO CPCR: 10.0000 meq | ORAL_CAPSULE | Freq: Every day | ORAL | Status: DC

## 2011-08-13 NOTE — Progress Notes (Signed)
Review of gastrointestinal problems:  1. Collagenous colitis. Presented June 2009 with perfuse diarrhea. Underwent colonoscopy early July 2009 essentially normal examination but random colon biopsies showed collagenous colitis. Responded fairly quickly to budesonide 3 pills a day. Also suffered some more than typical scope trauma during colonoscopy and was admitted with prophylactic antibiotics and observation. She was discharged without problems 48 hours later. She switched to low-dose prednisone do to prohibitive cost of Entocort. May, 2010 she has found that 5 mg a day of prednisone does not hold her symptoms however 10 mg of prednisone a day keeps her diarrhea completely controlled. February, 2011: had tapered down prednisone to 2.5 milligrams every other day and started to flare. Increased prednisone to 15 mg a day with dramatic, complete response. March, 2012 was taking prednisone 10 mg every day, began to taper. 2. intermittent small bowel obstruction: Likely adhesive, presented July 2012 with small bowel obstruction by plain films and CT scan. Resolved with NG tube decompression, conservative measures.  HPI: Ariel is a  very pleasant 77 year old Hunt who is here with her son.  Went to hosp in July with SBO. Since d/c has been doing well (no n/v or abd pains).  Does have trouble with low back pain, fracture.  She needs hydrocodone once daily.  Also on tramadol.  She is still on budesonide 3 pills once a day.  First SBO was 12 years ago (required surgery to fix).  Second SBO did not require sugery.  Ariel recent one also resolved with conservative measured.    Review of systems: Pertinent positive and negative review of systems were noted in the above HPI section.  All other review of systems was otherwise negative.   Past Medical History  Diagnosis Date  . Colitis   . Weight loss   . Diarrhea   . CAD (coronary artery disease)   . Hyperlipidemia   . Hypertension   . Asthma   . GERD  (gastroesophageal reflux disease)     Past Surgical History  Procedure Date  . Hernia repair   . Coronary angioplasty with stent placement      reports that she has quit smoking. She has never used smokeless tobacco. She reports that she does not drink alcohol or use illicit drugs.  family history includes Colon cancer in her father.    Current Medications, Allergies were all reviewed with the patient via [facility] electronic medical record system.    Physical Exam: BP 102/60  Pulse 60  Ht 5\' 3"  (1.6 m)  Wt 86 lb (39.009 kg)  BMI 15.23 kg/m2 Constitutional: generally well-appearing Psychiatric: alert and oriented x3 Eyes: extraocular movements intact Mouth: oral pharynx moist, no lesions Neck: supple no lymphadenopathy Cardiovascular: heart regular rate and rhythm Lungs: clear to auscultation bilaterally Abdomen: soft, nontender, nondistended, no obvious ascites, no peritoneal signs, normal bowel sounds Extremities: no lower extremity edema bilaterally Skin: no lesions on visible extremities    Assessment and plan: 77 y.o. female with collagenous colitis, intermittent small bowel obstruction, narcotic requiring back pain  She is on budesonide for her collagenous colitis. Recently she has needed narcotic pain medicine for severe low back pain. Ariel is being evaluated and she'll probably be getting an injection at the site. Hopefully she will be able to come off narcotics since those put her at risk for slow motility, perhaps recurrent bowel obstruction. She also takes MiraLax periodically. I recommend she cut back on her budesonide to 2 pills once daily and she'll return to  see me in 2 months and sooner if needed

## 2011-08-13 NOTE — Patient Instructions (Addendum)
Decrease the budesonide to two pills once a day. Return to see Dr. Christella Hartigan in 2 months. Narcotic pain medicines can cause bowels to move slower. A copy of this information will be made available to Dr. Tenny Craw.

## 2011-09-26 LAB — CBC
HCT: 32.3 — ABNORMAL LOW
Hemoglobin: 10.8 — ABNORMAL LOW
MCHC: 33.4
MCHC: 33.6
MCV: 95.1
Platelets: 276
Platelets: 288
RBC: 3.51 — ABNORMAL LOW
RBC: 3.85 — ABNORMAL LOW
RDW: 14.6
RDW: 14.9
WBC: 5.7

## 2011-09-26 LAB — COMPREHENSIVE METABOLIC PANEL
ALT: 17
AST: 23
Albumin: 3.5
CO2: 22
Chloride: 109
Creatinine, Ser: 0.69
GFR calc Af Amer: >60
GFR calc non Af Amer: >60
Sodium: 140
Total Bilirubin: 0.8

## 2011-10-14 ENCOUNTER — Encounter: Payer: Self-pay | Admitting: Gastroenterology

## 2011-10-14 ENCOUNTER — Ambulatory Visit (INDEPENDENT_AMBULATORY_CARE_PROVIDER_SITE_OTHER): Payer: Medicare Other | Admitting: Gastroenterology

## 2011-10-14 ENCOUNTER — Other Ambulatory Visit (INDEPENDENT_AMBULATORY_CARE_PROVIDER_SITE_OTHER): Payer: Medicare Other

## 2011-10-14 VITALS — BP 110/76 | HR 80 | Ht 63.0 in | Wt 87.6 lb

## 2011-10-14 DIAGNOSIS — K52831 Collagenous colitis: Secondary | ICD-10-CM

## 2011-10-14 DIAGNOSIS — K5289 Other specified noninfective gastroenteritis and colitis: Secondary | ICD-10-CM

## 2011-10-14 LAB — BASIC METABOLIC PANEL
Chloride: 105 mEq/L (ref 96–112)
GFR: 93.52 mL/min (ref >60.00)
Potassium: 4.3 mEq/L (ref 3.5–5.1)
Sodium: 139 mEq/L (ref 135–145)

## 2011-10-14 MED ORDER — BUDESONIDE 3 MG PO CP24: 3.0000 mg | ORAL_CAPSULE | Freq: Every day | ORAL | Status: DC

## 2011-10-14 NOTE — Patient Instructions (Signed)
Change the budesonide dose to one pill a day. Try to refrain from any further miralax doses. Try not to take narcotic pain meds, these can constipate you. You will have labs checked today in the basement lab.  Please head down after you check out with the front desk  (bmet). Will decide on further potassium after those labs. Return to see. Dr. Christella Hartigan in 3 months. A copy of this information will be made available to Dr. Tenny Craw.

## 2011-10-14 NOTE — Progress Notes (Signed)
Review of gastrointestinal problems:  1. Collagenous colitis. Presented June 2009 with perfuse diarrhea. Underwent colonoscopy early July 2009 essentially normal examination but random colon biopsies showed collagenous colitis. Responded fairly quickly to budesonide 3 pills a day. Also suffered some more than typical scope trauma during colonoscopy and was admitted with prophylactic antibiotics and observation. She was discharged without problems 48 hours later. She switched to low-dose prednisone do to prohibitive cost of Entocort. May, 2010 she has found that 5 mg a day of prednisone does not hold her symptoms however 10 mg of prednisone a day keeps her diarrhea completely controlled. February, 2011: had tapered down prednisone to 2.5 milligrams every other day and started to flare. Increased prednisone to 15 mg a day with dramatic, complete response. March, 2012 was taking prednisone 10 mg every day, began to taper.  2. intermittent small bowel obstruction: Likely adhesive, presented July 2012 with small bowel obstruction by plain films and CT scan. Resolved with NG tube decompression, conservative measures.  HPI: This is a  very pleasant 77 year old woman who I last saw about 2 months ago.  Had tooth pulled.  She is currently on 2 budenosonide a day.    On narcotics a few times a week, but not daily for hip pains.  She has no diarrhea, she is intermittently constipated now.    Past Medical History:   Colitis                                                      Weight loss                                                  Diarrhea                                                     CAD (coronary artery disease)                                Hyperlipidemia                                               Hypertension                                                 Asthma                                                       GERD (gastroesophageal reflux disease)  Past  Surgical History:   HERNIA REPAIR                                                CORONARY ANGIOPLASTY WITH STENT PLACEMENT                    reports that she quit smoking about 20 years ago. She has never used smokeless tobacco. She reports that she does not drink alcohol or use illicit drugs.  family history includes Colon cancer in her father.    Current medicines and allergies were reviewed in [facility]    Physical Exam: BP 110/76  Pulse 80  Ht 5\' 3"  (1.6 m)  Wt 87 lb 9.6 oz (39.735 kg)  BMI 15.52 kg/m2 Constitutional: generally well-appearing Psychiatric: alert and oriented x3 Abdomen: soft, nontender, nondistended, no obvious ascites, no peritoneal signs, normal bowel sounds     Assessment and plan: 77 y.o. female with  collagenous colitis  She is going to decrease her budesonide to one dose a day. She will try to refrain from narcotics and MiraLax dosing. She'll return to see me in 3 months and sooner if needed. She'll get a basic metabolic profile today to check her potassium status.

## 2012-01-09 ENCOUNTER — Other Ambulatory Visit (HOSPITAL_COMMUNITY): Payer: Self-pay | Admitting: Obstetrics and Gynecology

## 2012-01-09 DIAGNOSIS — Z1231 Encounter for screening mammogram for malignant neoplasm of breast: Secondary | ICD-10-CM

## 2012-01-27 ENCOUNTER — Other Ambulatory Visit: Payer: Self-pay | Admitting: Gastroenterology

## 2012-01-27 MED ORDER — BUDESONIDE 3 MG PO CP24: 3.0000 mg | ORAL_CAPSULE | Freq: Two times a day (BID) | ORAL | Status: DC

## 2012-01-27 NOTE — Telephone Encounter (Signed)
Pt had a flare of colitis and doubled her Entocort and has now ran out.  Needs a refill.  She typically does 4 weeks twice daily while she is having a flare, this is the second week.  She does better on twice daily, she has more flares on once daily dosing can she continue the bid dosing indefinitely.  Please advise.  Pt has been scheduled for a ROV on 02/10/12

## 2012-01-27 NOTE — Telephone Encounter (Signed)
Pt aware med has been sent to the pharmacy 

## 2012-01-27 NOTE — Telephone Encounter (Signed)
Please refill entocort.  She should take 2 pills a day, disp 60, 3 refills.  Will discuss tapering when she comes fro rov next month

## 2012-02-10 ENCOUNTER — Encounter: Payer: Self-pay | Admitting: Gastroenterology

## 2012-02-10 ENCOUNTER — Ambulatory Visit (INDEPENDENT_AMBULATORY_CARE_PROVIDER_SITE_OTHER): Payer: Medicare Other | Admitting: Gastroenterology

## 2012-02-10 VITALS — BP 128/80 | HR 80 | Ht 63.0 in | Wt 94.4 lb

## 2012-02-10 DIAGNOSIS — K5289 Other specified noninfective gastroenteritis and colitis: Secondary | ICD-10-CM

## 2012-02-10 DIAGNOSIS — K52831 Collagenous colitis: Secondary | ICD-10-CM

## 2012-02-10 MED ORDER — BUDESONIDE 3 MG PO CP24: 6.0000 mg | ORAL_CAPSULE | Freq: Every day | ORAL | Status: DC

## 2012-02-10 NOTE — Progress Notes (Signed)
Review of gastrointestinal problems:  1. Collagenous colitis. Presented June 2009 with perfuse diarrhea. Underwent colonoscopy early July 2009 essentially normal examination but random colon biopsies showed collagenous colitis. Responded fairly quickly to budesonide 3 pills a day. Also suffered some more than typical scope trauma during colonoscopy and was admitted with prophylactic antibiotics and observation. She was discharged without problems 48 hours later. She switched to low-dose prednisone do to prohibitive cost of Entocort. May, 2010 she has found that 5 mg a day of prednisone does not hold her symptoms however 10 mg of prednisone a day keeps her diarrhea completely controlled. February, 2011: had tapered down prednisone to 2.5 milligrams every other day and started to flare. Increased prednisone to 15 mg a day with dramatic, complete response. March, 2012 was taking prednisone 10 mg every day, began to taper.  2. intermittent small bowel obstruction: Likely adhesive, presented July 2012 with small bowel obstruction by plain films and CT scan. Resolved with NG tube decompression, conservative measures.   HPI: This is a very pleasant 78 year old woman whom I last saw about 3 or 4 months ago. She is here with her daughter-in-law today.  She was taking one budesonide a day but one month ago she increased to twice a day entocort.  Two a day helps much better for the diarrhea.  No rectal bleeding.  She gets around well, very good day this past weekends doing errands.  Occasional red wine.     Past Medical History  Diagnosis Date  . Colitis   . Weight loss   . Diarrhea   . CAD (coronary artery disease)   . Hyperlipidemia   . Hypertension   . Asthma   . GERD (gastroesophageal reflux disease)     Past Surgical History  Procedure Date  . Hernia repair   . Coronary angioplasty with stent placement     Current Outpatient Prescriptions  Medication Sig Dispense Refill  . aspirin 81 MG  tablet Take 81 mg by mouth daily.        . budesonide (ENTOCORT EC) 3 MG 24 hr capsule Take 1 capsule (3 mg total) by mouth 2 (two) times daily.  60 capsule  3  . calcium-vitamin D (OSCAL WITH D) 250-125 MG-UNIT per tablet Take 1 tablet by mouth 2 (two) times daily.        Marland Kitchen escitalopram (LEXAPRO) 10 MG tablet Take 5 mg by mouth daily.        . Fluticasone-Salmeterol (ADVAIR DISKUS) 100-50 MCG/DOSE AEPB Inhale 1 puff into the lungs every 12 (twelve) hours.        Marland Kitchen HYDROcodone-acetaminophen (VICODIN) 5-500 MG per tablet Take 1 tablet by mouth every 6 (six) hours as needed.        . Multiple Vitamin (MULTIVITAMIN) tablet Take 1 tablet by mouth daily.        . pravastatin (PRAVACHOL) 80 MG tablet Take 40 mg by mouth daily.        . traMADol (ULTRAM) 50 MG tablet Take 50 mg by mouth every 6 (six) hours as needed.          Allergies as of 02/10/2012  . (No Known Allergies)    Family History  Problem Relation Age of Onset  . Colon cancer Father     History   Social History  . Marital Status: Widowed    Spouse Name: N/A    Number of Children: 3  . Years of Education: N/A   Occupational History  . Not on  file.   Social History Main Topics  . Smoking status: Former Smoker    Quit date: 12/30/1990  . Smokeless tobacco: Never Used  . Alcohol Use: No  . Drug Use: No  . Sexually Active: Not on file   Other Topics Concern  . Not on file   Social History Narrative  . No narrative on file      Physical Exam: BP 128/80  Pulse 80  Ht 5\' 3"  (1.6 m)  Wt 94 lb 6.4 oz (42.82 kg)  BMI 16.72 kg/m2 Constitutional: Very thin, cachectic-appearing (this is normal for her) Psychiatric: alert and oriented x3 Abdomen: soft, nontender, nondistended, no obvious ascites, no peritoneal signs, normal bowel sounds     Assessment and plan: 78 y.o. female with collagenous colitis  Her symptoms are under good control on either one or 2 Entocort pills a day. She will continue this for the  foreseeable future. She will return to see me in 3-4 months and sooner if needed.

## 2012-02-10 NOTE — Patient Instructions (Signed)
Would try to decrease again to one entocort pill a day.  Go back up to two pills a day if needed. Return to see Dr. Christella Hartigan in 3-4 months.

## 2012-02-11 ENCOUNTER — Ambulatory Visit (HOSPITAL_COMMUNITY)
Admission: RE | Admit: 2012-02-11 | Discharge: 2012-02-11 | Disposition: A | Discharge status: Home / Self Care | Payer: Medicare Other | Source: Ambulatory Visit | Attending: Obstetrics and Gynecology | Admitting: Obstetrics and Gynecology

## 2012-02-11 DIAGNOSIS — Z1231 Encounter for screening mammogram for malignant neoplasm of breast: Secondary | ICD-10-CM | POA: Insufficient documentation

## 2012-05-08 ENCOUNTER — Emergency Department (HOSPITAL_COMMUNITY): Payer: Medicare Other

## 2012-05-08 ENCOUNTER — Encounter (HOSPITAL_COMMUNITY): Payer: Self-pay | Admitting: *Deleted

## 2012-05-08 ENCOUNTER — Inpatient Hospital Stay (HOSPITAL_COMMUNITY)
Admission: EM | Admit: 2012-05-08 | Discharge: 2012-05-11 | DRG: 193 | Disposition: A | Discharge status: Home Health | Payer: Medicare Other | Attending: Internal Medicine | Admitting: Internal Medicine

## 2012-05-08 DIAGNOSIS — I639 Cerebral infarction, unspecified: Secondary | ICD-10-CM

## 2012-05-08 DIAGNOSIS — Z9861 Coronary angioplasty status: Secondary | ICD-10-CM

## 2012-05-08 DIAGNOSIS — R4182 Altered mental status, unspecified: Secondary | ICD-10-CM

## 2012-05-08 DIAGNOSIS — Z66 Do not resuscitate: Secondary | ICD-10-CM | POA: Diagnosis present

## 2012-05-08 DIAGNOSIS — E785 Hyperlipidemia, unspecified: Secondary | ICD-10-CM | POA: Diagnosis present

## 2012-05-08 DIAGNOSIS — Z87891 Personal history of nicotine dependence: Secondary | ICD-10-CM

## 2012-05-08 DIAGNOSIS — J189 Pneumonia, unspecified organism: Principal | ICD-10-CM | POA: Diagnosis present

## 2012-05-08 DIAGNOSIS — G9341 Metabolic encephalopathy: Secondary | ICD-10-CM | POA: Diagnosis present

## 2012-05-08 DIAGNOSIS — I2510 Atherosclerotic heart disease of native coronary artery without angina pectoris: Secondary | ICD-10-CM | POA: Diagnosis present

## 2012-05-08 DIAGNOSIS — I10 Essential (primary) hypertension: Secondary | ICD-10-CM | POA: Diagnosis present

## 2012-05-08 DIAGNOSIS — K219 Gastro-esophageal reflux disease without esophagitis: Secondary | ICD-10-CM | POA: Diagnosis present

## 2012-05-08 DIAGNOSIS — Z7982 Long term (current) use of aspirin: Secondary | ICD-10-CM

## 2012-05-08 DIAGNOSIS — J45909 Unspecified asthma, uncomplicated: Secondary | ICD-10-CM | POA: Diagnosis present

## 2012-05-08 DIAGNOSIS — E871 Hypo-osmolality and hyponatremia: Secondary | ICD-10-CM | POA: Diagnosis present

## 2012-05-08 LAB — DIFFERENTIAL
Basophils Absolute: 0 10*3/uL (ref 0.0–0.1)
Lymphocytes Relative: 16 % (ref 12–46)
Lymphs Abs: 1.2 10*3/uL (ref 0.7–4.0)
Monocytes Absolute: 0.9 10*3/uL (ref 0.1–1.0)
Monocytes Relative: 11 % (ref 3–12)
Neutro Abs: 5.1 10*3/uL (ref 1.7–7.7)

## 2012-05-08 LAB — COMPREHENSIVE METABOLIC PANEL
AST: 17 U/L (ref 0–37)
BUN: 6 mg/dL (ref 6–23)
CO2: 19 mEq/L (ref 19–32)
Chloride: 85 mEq/L — ABNORMAL LOW (ref 96–112)
Creatinine, Ser: 0.52 mg/dL (ref 0.50–1.10)
GFR calc Af Amer: >90 mL/min (ref >90)
GFR calc non Af Amer: 84 mL/min — ABNORMAL LOW (ref >90)
Glucose, Bld: 145 mg/dL — ABNORMAL HIGH (ref 70–99)
Total Bilirubin: 0.3 mg/dL (ref 0.3–1.2)

## 2012-05-08 LAB — CBC
HCT: 31.9 % — ABNORMAL LOW (ref 36.0–46.0)
Hemoglobin: 11 g/dL — ABNORMAL LOW (ref 12.0–15.0)
MCV: 89.4 fL (ref 78.0–100.0)
WBC: 7.6 10*3/uL (ref 4.0–10.5)

## 2012-05-08 LAB — BLOOD GAS, ARTERIAL
Bicarbonate: 20.6 mEq/L (ref 20.0–24.0)
O2 Saturation: 98.8 %
Patient temperature: 98.6

## 2012-05-08 MED ORDER — IPRATROPIUM BROMIDE 0.02 % IN SOLN
0.5000 mg | Freq: Once | RESPIRATORY_TRACT | Status: AC
  Administered 2012-05-08: 0.5 mg via RESPIRATORY_TRACT
  Filled 2012-05-08: qty 2.5

## 2012-05-08 MED ORDER — SODIUM CHLORIDE 0.9 % IV BOLUS (SEPSIS)
500.0000 mL | INTRAVENOUS | Status: AC
  Administered 2012-05-08: 500 mL via INTRAVENOUS

## 2012-05-08 MED ORDER — ALBUTEROL SULFATE (5 MG/ML) 0.5% IN NEBU
5.0000 mg | INHALATION_SOLUTION | Freq: Once | RESPIRATORY_TRACT | Status: AC
  Administered 2012-05-08: 5 mg via RESPIRATORY_TRACT
  Filled 2012-05-08: qty 1

## 2012-05-08 MED ORDER — SODIUM CHLORIDE 0.9 % IV SOLN: INTRAVENOUS | Status: DC

## 2012-05-08 MED ORDER — MOXIFLOXACIN HCL IN NACL 400 MG/250ML IV SOLN
400.0000 mg | Freq: Once | INTRAVENOUS | Status: AC
  Administered 2012-05-09: 400 mg via INTRAVENOUS
  Filled 2012-05-08: qty 250

## 2012-05-08 NOTE — ED Notes (Addendum)
Family notified staff that there was a sudden change in speech. RN to assess pt, noticed pts speech to be garbled, change in size of pupils. Pupils remained reactive. Pt unable to follow commands. Dr. Lynelle Doctor notified and in to assess pt.

## 2012-05-08 NOTE — Code Documentation (Signed)
78yo wf brought from [facility] via [facility] to [facility] for stroke symptom eval.  Seen at [facility] for N/V & treated for recent PNA.  Family notified ED RN at 2115 that pt had had developed slurred speech and the RN found the pt to have unequal pupils.  Pt arrival to [facility] 2330, [name] exam 2330, stroke team arrival 2335, LSN 2115 pt arrival in CT at [facility] 2208, labs drawn at [facility], Code stroke cancelled at 2343. NIH 3 for inability to answer questions & mild to mod aphasia.

## 2012-05-08 NOTE — ED Notes (Signed)
Patient arrival to [facility]; neurologist and Dr. Norlene Campbell at bedside.  Received bedside report from [facility].

## 2012-05-08 NOTE — Consult Note (Signed)
Referring Physician: Norlene Campbell    Chief Complaint: Slurred speech, confusion and unequal pupils  HPI: Ariel Hunt is an 78 y.o. female who was brought to the ED tonight by family.  Has not been doing well for the past few days but seemed to be even worse today.  Was being evaluated in ED.  Was noted by family at 2115 to have an acute change with slurred speech, difficulty getting her words out and confusion.  Staff was made aware and code stroke was called. Lab work shows a sodium of 118.  PH on blood gas was 7.5.  Patient diagnosed with pneumonia as well.  Head CT performed as part of the work up shows an area of hypodensity in the left frontal lobe.    LSN: [id number] tPA Given: No: Stroke seen on imaging likely outside time window.  Past Medical History  Diagnosis Date  . Colitis   . Weight loss   . Diarrhea   . CAD (coronary artery disease)   . Hyperlipidemia   . Hypertension   . Asthma   . GERD (gastroesophageal reflux disease)     Past Surgical History  Procedure Date  . Hernia repair   . Coronary angioplasty with stent placement     Family History  Problem Relation Age of Onset  . Colon cancer Father    Social History:  reports that she quit smoking about 21 years ago. She has never used smokeless tobacco. She reports that she does not drink alcohol or use illicit drugs.  Allergies: No Known Allergies  Medications: I have reviewed the patient's current medications. Prior to Admission:  ASA, Entocort, Oscal with D, Lexapro, Advair, Vicodin, Avelox, MVI, Pravachol, Tramadol  ROS: History obtained from the patient  General ROS: fatigue Psychological ROS: negative for - behavioral disorder, hallucinations, memory difficulties, mood swings or suicidal ideation Ophthalmic ROS: negative for - blurry vision, double vision, eye pain or loss of vision ENT ROS: negative for - epistaxis, nasal discharge, oral lesions, sore throat, tinnitus or vertigo Allergy and Immunology ROS:  negative for - hives or itchy/watery eyes Hematological and Lymphatic ROS: bruising  Endocrine ROS: negative for - galactorrhea, hair pattern changes, polydipsia/polyuria or temperature intolerance Respiratory ROS: cough Cardiovascular ROS: negative for - chest pain, dyspnea on exertion, edema or irregular heartbeat Gastrointestinal ROS: nausea/vomiting  Genito-Urinary ROS: negative for - dysuria, hematuria, incontinence or urinary frequency/urgency Musculoskeletal ROS: negative for - joint swelling or muscular weakness Neurological ROS: as noted in HPI Dermatological ROS: negative for rash and skin lesion changes  Physical Examination: Blood pressure 143/82, pulse 79, temperature 97.4 F (36.3 C), temperature source Oral, resp. rate 16, SpO2 100.00%.  Neurologic Examination: Mental Status: Alert.  Knows name but unable to tell me the date or where she is.  Speech fluent but unable to name objects.  Difficulty with 3 step commands. Cranial Nerves: II: visual fields grossly normal, pupils, round, reactive to light and accommodation. Left pupil 3mm, right pupil 2mm III,IV, VI: ptosis not present, extra-ocular motions intact bilaterally V,VII: smile symmetric, facial light touch sensation normal bilaterally VIII: hearing decreased bilaterally IX,X: gag reflex present XI: trapezius strength/neck flexion strength normal bilaterally XII: tongue strength normal  Motor: Right : Upper extremity   5/5    Left:     Upper extremity   5/5  Lower extremity   5/5     Lower extremity   5/5 Tone and bulk:normal tone throughout; no atrophy noted Sensory: Pinprick and light touch intact  throughout, bilaterally Deep Tendon Reflexes: 2+ in the upper extremities, trace at the knees and absent at the ankles Plantars: Right: mute   Left: mute Cerebellar: normal finger-to-nose and normal heel-to-shin test  Results for orders placed during the hospital encounter of 05/08/12 (from the past 48 hour(s))    CBC     Status: Abnormal   Collection Time   05/08/12  7:48 PM      Component Value Range Comment   WBC 7.6  4.0 - 10.5 (K/uL)    RBC 3.57 (*) 3.87 - 5.11 (MIL/uL)    Hemoglobin 11.0 (*) 12.0 - 15.0 (g/dL)    HCT 45.4 (*) 09.8 - 46.0 (%)    MCV 89.4  78.0 - 100.0 (fL)    MCH 30.8  26.0 - 34.0 (pg)    MCHC 34.5  30.0 - 36.0 (g/dL)    RDW 11.9  14.7 - 82.9 (%)    Platelets 404 (*) 150 - 400 (K/uL)   DIFFERENTIAL     Status: Normal   Collection Time   05/08/12  7:48 PM      Component Value Range Comment   Neutrophils Relative 67  43 - 77 (%)    Neutro Abs 5.1  1.7 - 7.7 (K/uL)    Lymphocytes Relative 16  12 - 46 (%)    Lymphs Abs 1.2  0.7 - 4.0 (K/uL)    Monocytes Relative 11  3 - 12 (%)    Monocytes Absolute 0.9  0.1 - 1.0 (K/uL)    Eosinophils Relative 5  0 - 5 (%)    Eosinophils Absolute 0.4  0.0 - 0.7 (K/uL)    Basophils Relative 1  0 - 1 (%)    Basophils Absolute 0.0  0.0 - 0.1 (K/uL)   COMPREHENSIVE METABOLIC PANEL     Status: Abnormal   Collection Time   05/08/12  7:48 PM      Component Value Range Comment   Sodium 118 (*) 135 - 145 (mEq/L)    Potassium 3.5  3.5 - 5.1 (mEq/L)    Chloride 85 (*) 96 - 112 (mEq/L)    CO2 19  19 - 32 (mEq/L)    Glucose, Bld 145 (*) 70 - 99 (mg/dL)    BUN 6  6 - 23 (mg/dL)    Creatinine, Ser 5.62  0.50 - 1.10 (mg/dL)    Calcium 6.9 (*) 8.4 - 10.5 (mg/dL)    Total Protein 5.9 (*) 6.0 - 8.3 (g/dL)    Albumin 3.2 (*) 3.5 - 5.2 (g/dL)    AST 17  0 - 37 (U/L)    ALT 12  0 - 35 (U/L)    Alkaline Phosphatase 33 (*) 39 - 117 (U/L)    Total Bilirubin 0.3  0.3 - 1.2 (mg/dL)    GFR calc non Af Amer 84 (*) >90 (mL/min)    GFR calc Af Amer >90  >90 (mL/min)   BLOOD GAS, ARTERIAL     Status: Abnormal   Collection Time   05/08/12  9:00 PM      Component Value Range Comment   O2 Content 2.0      Delivery systems NASAL CANNULA      pH, Arterial 7.506 (*) 7.350 - 7.400     pCO2 arterial 26.3 (*) 35.0 - 45.0 (mmHg)    pO2, Arterial 120.0 (*) 80.0 -  100.0 (mmHg)    Bicarbonate 20.6  20.0 - 24.0 (mEq/L)    TCO2 18.8  0 - 100 (  mmol/L)    Acid-base deficit 1.2  0.0 - 2.0 (mmol/L)    O2 Saturation 98.8      Patient temperature 98.6      Collection site RIGHT RADIAL      Drawn by [id number]      Sample type ARTERIAL DRAW      Allens test (pass/fail) PASS  PASS    GLUCOSE, CAPILLARY     Status: Abnormal   Collection Time   05/08/12  9:20 PM      Component Value Range Comment   Glucose-Capillary 127 (*) 70 - 99 (mg/dL)    Ct Head Wo Contrast  05/08/2012  *RADIOLOGY REPORT*  Clinical Data: The altered mental status.  Weakness.  Shortness of breath.  Fatigue.  CT HEAD WITHOUT CONTRAST  Technique:  Contiguous axial images were obtained from the base of the skull through the vertex without contrast.  Comparison: None.  Findings: Diffuse bilateral cerebral atrophy.  Mild ventricular dilatation consistent with central atrophy.  There is a vague low attenuation change in the left frontal temporal region which appears to spare the cortex.  There is suggestion of mild sulci effacement.  Early changes of acute infarct are suggested.  Diffuse low attenuation change throughout the deep white matter consistent with underlying small vessel ischemic changes.  No evidence of acute intracranial hemorrhage.  No effacement the basilar cisterns. No abnormal extra-axial fluid collections.  No depressed skull fractures.  Visualized paranasal sinuses and mastoid air cells are not opacified.  IMPRESSION: Low attenuation change in the left frontal temporal region with mild sulci effacement suggests early changes of acute infarct in the distribution of the left middle cerebral artery.  Chronic atrophy and small vessel ischemic changes.  No evidence of acute intracranial hemorrhage.  Original Report Authenticated By: Marlon Pel, M.D.   Dg Chest Port 1 View  05/08/2012  *RADIOLOGY REPORT*  Clinical Data: Cough and shortness of breath.  PORTABLE CHEST - 1 VIEW  Comparison:  PA and lateral chest 05/06/2012.  Findings: Small left effusion and basilar airspace disease have increased.  There is some subsegmental atelectasis in the right base.  No pneumothorax.  Heart size upper normal.  Lungs appear emphysematous.  IMPRESSION: Increased left effusion and basilar airspace disease compatible with pneumonia.  Original Report Authenticated By: Bernadene Bell. Maricela Curet, M.D.    Assessment: 78 y.o. female with pneumonia and metabolic abnormalities.  Noted to be doing poorly recently bu with an acute change while in the ED.  There is concern for a left frontal infarct, although doubt this occurred at the time of the acute clinical changes, therefore patient not a tPA candidate.    Stroke Risk Factors - hyperlipidemia and hypertension  Plan: 1. HgbA1c, fasting lipid panel 2. MRI, MRA  of the brain without contrast 3. PT consult, OT consult, Speech consult 4. Echocardiogram 5. Carotid dopplers 6. Prophylactic therapy-Antiplatelet med: Plavix - dose 75mg  daily.  To replace ASA 7. Risk factor modification 8. Telemetry monitoring 9. Frequent neuro checks   Thana Farr, MD [facility] [phone] 05/08/2012, 11:50 PM

## 2012-05-08 NOTE — ED Notes (Signed)
Neurologist at bedside. 

## 2012-05-08 NOTE — ED Provider Notes (Signed)
History     CSN: [id number]  Arrival date & time 05/08/12  1831   First MD Initiated Contact with Patient 05/08/12 1908      Chief Complaint  Patient presents with  . Shortness of Breath  . Pneumonia  . Fatigue  . Weakness    (Consider location/radiation/quality/duration/timing/severity/associated sxs/prior treatment) HPI  History obtained from patient and her daughter. Daughter states 9 days ago patient had vomiting and diarrhea that lasted about 2 days however patient has not seemed to recover  in her strength. She has been sleeping a lot and acting more lethargic than usual. She also reports she has not been eating. She was seen 2 days ago by her family physician Dr. Tenny Craw and had a chest x-ray that showed a small pneumonia. The patient has been on Avelox. They report she has a chronic cough which seemed to be get getting worse today she also got short of breath today which is unusual for her. She has been having chills at home today without known fever. Patient states she has heard some wheezing and hears a lot of rattling in her chest. She states she's never had to use a inhaler or nebulizer in the past although she is on Advair for chronic lung disease.   PCP Dr. Tenny Craw Pain management  Dr. Jordan Likes, had heat injections to her back  Past Medical History  Diagnosis Date  . Colitis   . Weight loss   . Diarrhea   . CAD (coronary artery disease)   . Hyperlipidemia   . Hypertension   . Asthma   . GERD (gastroesophageal reflux disease)   scoliosis  Past Surgical History  Procedure Date  . Hernia repair   . Coronary angioplasty with stent placement     Family History  Problem Relation Age of Onset  . Colon cancer Father     History  Substance Use Topics  . Smoking status: Former Smoker    Quit date: 12/30/1990  . Smokeless tobacco: Never Used  . Alcohol Use: No  lives at home alone No home oxygen  OB History    Grav Para Term Preterm Abortions TAB SAB Ect Mult  Living                  Review of Systems  All other systems reviewed and are negative.    Allergies  Review of patient's allergies indicates no known allergies.  Home Medications   Current Outpatient Rx  Name Route Sig Dispense Refill  . ASPIRIN 81 MG PO TABS Oral Take 81 mg by mouth daily.      . BUDESONIDE ER 3 MG PO CP24 Oral Take 2 capsules (6 mg total) by mouth daily. 60 capsule 5  . CALCIUM CARBONATE-VITAMIN D 250-125 MG-UNIT PO TABS Oral Take 1 tablet by mouth 2 (two) times daily.      Marland Kitchen ESCITALOPRAM OXALATE 10 MG PO TABS Oral Take 5 mg by mouth daily.      Marland Kitchen FLUTICASONE-SALMETEROL 100-50 MCG/DOSE IN AEPB Inhalation Inhale 1 puff into the lungs every 12 (twelve) hours.      Marland Kitchen HYDROCODONE-ACETAMINOPHEN 5-500 MG PO TABS Oral Take 1 tablet by mouth every 6 (six) hours as needed. For pain relief    . MOXIFLOXACIN HCL 400 MG PO TABS Oral Take 400 mg by mouth 2 (two) times daily.    Marland Kitchen ONE-DAILY MULTI VITAMINS PO TABS Oral Take 1 tablet by mouth daily.      Marland Kitchen PRAVASTATIN SODIUM 80 MG  PO TABS Oral Take 40 mg by mouth daily.      . TRAMADOL HCL 50 MG PO TABS Oral Take 50 mg by mouth every 6 (six) hours as needed. For pain relief    Avelox  BP 165/87  Pulse 82  Temp(Src) 97.5 F (36.4 C) (Oral)  Resp 32  SpO2 100%  Vital signs normal    Physical Exam  Nursing note and vitals reviewed. Constitutional: She is oriented to person, place, and time.  Non-toxic appearance. She does not appear ill. She appears distressed.       Small frail cachectic white female  HENT:  Head: Normocephalic and atraumatic.  Right Ear: External ear normal.  Left Ear: External ear normal.  Nose: Nose normal. No mucosal edema or rhinorrhea.  Mouth/Throat: Oropharynx is clear and moist and mucous membranes are normal. No dental abscesses or uvula swelling.  Eyes: Conjunctivae and EOM are normal. Pupils are equal, round, and reactive to light.  Neck: Normal range of motion and full passive range of  motion without pain. Neck supple.  Cardiovascular: Normal rate, regular rhythm and normal heart sounds.  Exam reveals no gallop and no friction rub.   No murmur heard. Pulmonary/Chest: She is in respiratory distress. She has no wheezes. She has no rhonchi. She has rales. She exhibits no tenderness and no crepitus.       Patient has frequent deep coughing, she has loose breath sounds consistent with mucous, she has some retractions without obvious wheezing  Abdominal: Soft. Normal appearance and bowel sounds are normal. She exhibits no distension. There is no tenderness. There is no rebound and no guarding.  Musculoskeletal: Normal range of motion. She exhibits no edema and no tenderness.       Moves all extremities well.   Neurological: She is alert and oriented to person, place, and time. She has normal strength. No cranial nerve deficit.  Skin: Skin is warm, dry and intact. No rash noted. No erythema. No pallor.  Psychiatric: Her speech is normal and behavior is normal. Her mood appears not anxious.       Patient is mildly agitated in that she keeps sighing stating she's tired and wants to go to sleep.    ED Course  Procedures (including critical care time)  Called to see patient after she received her nebulizer treatment by nursing staff. They report she seems more confused. Patient did not talk to me much before relying on her daughter to get most of her history. She is noted to have grunting which she was doing before. The nurse thought her left pupils different from her right  they are at most 1 mm difference in sizeis different from her right.patient is confused however her sodium is extremely low and would certainly make her confused and weak.   CODE STROKE CALLED 22:44  22:58 Dr Thad Ranger states to send to [facility] for her to evaluate  23:03 Dr Norlene Campbell accepts at [facility] ED   Results for orders placed during the hospital encounter of 05/08/12  CBC      Component Value Range   WBC 7.6  4.0  - 10.5 (K/uL)   RBC 3.57 (*) 3.87 - 5.11 (MIL/uL)   Hemoglobin 11.0 (*) 12.0 - 15.0 (g/dL)   HCT 25.3 (*) 66.4 - 46.0 (%)   MCV 89.4  78.0 - 100.0 (fL)   MCH 30.8  26.0 - 34.0 (pg)   MCHC 34.5  30.0 - 36.0 (g/dL)   RDW 40.3  47.4 -  15.5 (%)   Platelets 404 (*) 150 - 400 (K/uL)  DIFFERENTIAL      Component Value Range   Neutrophils Relative 67  43 - 77 (%)   Neutro Abs 5.1  1.7 - 7.7 (K/uL)   Lymphocytes Relative 16  12 - 46 (%)   Lymphs Abs 1.2  0.7 - 4.0 (K/uL)   Monocytes Relative 11  3 - 12 (%)   Monocytes Absolute 0.9  0.1 - 1.0 (K/uL)   Eosinophils Relative 5  0 - 5 (%)   Eosinophils Absolute 0.4  0.0 - 0.7 (K/uL)   Basophils Relative 1  0 - 1 (%)   Basophils Absolute 0.0  0.0 - 0.1 (K/uL)  COMPREHENSIVE METABOLIC PANEL      Component Value Range   Sodium 118 (*) 135 - 145 (mEq/L)   Potassium 3.5  3.5 - 5.1 (mEq/L)   Chloride 85 (*) 96 - 112 (mEq/L)   CO2 19  19 - 32 (mEq/L)   Glucose, Bld 145 (*) 70 - 99 (mg/dL)   BUN 6  6 - 23 (mg/dL)   Creatinine, Ser 4.09  0.50 - 1.10 (mg/dL)   Calcium 6.9 (*) 8.4 - 10.5 (mg/dL)   Total Protein 5.9 (*) 6.0 - 8.3 (g/dL)   Albumin 3.2 (*) 3.5 - 5.2 (g/dL)   AST 17  0 - 37 (U/L)   ALT 12  0 - 35 (U/L)   Alkaline Phosphatase 33 (*) 39 - 117 (U/L)   Total Bilirubin 0.3  0.3 - 1.2 (mg/dL)   GFR calc non Af Amer 84 (*) >90 (mL/min)   GFR calc Af Amer >90  >90 (mL/min)  BLOOD GAS, ARTERIAL      Component Value Range   O2 Content 2.0     Delivery systems NASAL CANNULA     pH, Arterial 7.506 (*) 7.350 - 7.400    pCO2 arterial 26.3 (*) 35.0 - 45.0 (mmHg)   pO2, Arterial 120.0 (*) 80.0 - 100.0 (mmHg)   Bicarbonate 20.6  20.0 - 24.0 (mEq/L)   TCO2 18.8  0 - 100 (mmol/L)   Acid-base deficit 1.2  0.0 - 2.0 (mmol/L)   O2 Saturation 98.8     Patient temperature 98.6     Collection site RIGHT RADIAL     Drawn by [id number]     Sample type ARTERIAL DRAW     Allens test (pass/fail) PASS  PASS   GLUCOSE, CAPILLARY      Component Value Range    Glucose-Capillary 127 (*) 70 - 99 (mg/dL)   Laboratory interpretation all normal except  severe hyponatremia, low chloride, malnutrition, her ABG does not show that she is retaining CO2    Dg Chest Port 1 View  05/08/2012  *RADIOLOGY REPORT*  Clinical Data: Cough and shortness of breath.  PORTABLE CHEST - 1 VIEW  Comparison: PA and lateral chest 05/06/2012.  Findings: Small left effusion and basilar airspace disease have increased.  There is some subsegmental atelectasis in the right base.  No pneumothorax.  Heart size upper normal.  Lungs appear emphysematous.  IMPRESSION: Increased left effusion and basilar airspace disease compatible with pneumonia.  Original Report Authenticated By: Bernadene Bell. Maricela Curet, M.D.    Date: 05/08/2012  Rate: 86  Rhythm: normal sinus rhythm  QRS Axis: normal  Intervals: normal  ST/T Wave abnormalities: nonspecific ST changes  Conduction Disutrbances:none  Narrative Interpretation: PVC's  Old EKG Reviewed: unchanged from 06/03/2010     1. Community acquired pneumonia   2.  Hyponatremia   3. Altered mental status   4. Stroke     Transfer to [facility] ED  Devoria Albe, MD, FACEP   MDM          Ward Givens, MD 05/09/12 [phone]

## 2012-05-08 NOTE — ED Notes (Signed)
Patient transported to CT 

## 2012-05-08 NOTE — ED Notes (Signed)
Pt family reports last week pt had stomach virus with diarrhea and vomiting and then followed by decreased appetite and lethargy.  Pt saw PCP Wednesday and was diagnosed with Pneumonia on chest x-ray.  Pt was placed on avelox, but continues to have shortness of breath, weakness and lethargy.

## 2012-05-08 NOTE — ED Notes (Signed)
Rapid response Code Stroke RN at bedside.

## 2012-05-08 NOTE — ED Notes (Signed)
[facility] has been notified for transport 

## 2012-05-08 NOTE — ED Notes (Signed)
Returned from Ct

## 2012-05-09 ENCOUNTER — Encounter (HOSPITAL_COMMUNITY): Payer: Self-pay | Admitting: Internal Medicine

## 2012-05-09 ENCOUNTER — Inpatient Hospital Stay (HOSPITAL_COMMUNITY): Payer: Medicare Other

## 2012-05-09 DIAGNOSIS — G9341 Metabolic encephalopathy: Secondary | ICD-10-CM

## 2012-05-09 DIAGNOSIS — J159 Unspecified bacterial pneumonia: Secondary | ICD-10-CM

## 2012-05-09 DIAGNOSIS — E871 Hypo-osmolality and hyponatremia: Secondary | ICD-10-CM

## 2012-05-09 DIAGNOSIS — J189 Pneumonia, unspecified organism: Secondary | ICD-10-CM | POA: Diagnosis present

## 2012-05-09 DIAGNOSIS — R4789 Other speech disturbances: Secondary | ICD-10-CM

## 2012-05-09 DIAGNOSIS — I6330 Cerebral infarction due to thrombosis of unspecified cerebral artery: Secondary | ICD-10-CM

## 2012-05-09 DIAGNOSIS — E785 Hyperlipidemia, unspecified: Secondary | ICD-10-CM | POA: Diagnosis present

## 2012-05-09 DIAGNOSIS — I10 Essential (primary) hypertension: Secondary | ICD-10-CM | POA: Diagnosis present

## 2012-05-09 DIAGNOSIS — I6340 Cerebral infarction due to embolism of unspecified cerebral artery: Secondary | ICD-10-CM

## 2012-05-09 DIAGNOSIS — I2510 Atherosclerotic heart disease of native coronary artery without angina pectoris: Secondary | ICD-10-CM | POA: Diagnosis present

## 2012-05-09 LAB — BASIC METABOLIC PANEL
BUN: 6 mg/dL (ref 6–23)
Chloride: 102 mEq/L (ref 96–112)
GFR calc Af Amer: >90 mL/min (ref >90)
Potassium: 2.9 mEq/L — ABNORMAL LOW (ref 3.5–5.1)
Sodium: 133 mEq/L — ABNORMAL LOW (ref 135–145)

## 2012-05-09 LAB — LIPID PANEL
Cholesterol: 130 mg/dL (ref 0–200)
HDL: 39 mg/dL — ABNORMAL LOW (ref >39)
Total CHOL/HDL Ratio: 3.3 RATIO
VLDL: 16 mg/dL (ref 0–40)

## 2012-05-09 LAB — NA AND K (SODIUM & POTASSIUM), RAND UR: Sodium, Ur: 14 mEq/L

## 2012-05-09 LAB — HEMOGLOBIN A1C: Mean Plasma Glucose: 123 mg/dL — ABNORMAL HIGH (ref <117)

## 2012-05-09 LAB — PROTIME-INR: INR: 1.03 (ref 0.00–1.49)

## 2012-05-09 MED ORDER — LORAZEPAM 2 MG/ML IJ SOLN
0.5000 mg | Freq: Once | INTRAMUSCULAR | Status: AC
  Administered 2012-05-09: 0.5 mg via INTRAVENOUS
  Filled 2012-05-09: qty 1

## 2012-05-09 MED ORDER — PIPERACILLIN-TAZOBACTAM 3.375 G IVPB
3.3750 g | Freq: Three times a day (TID) | INTRAVENOUS | Status: DC
  Administered 2012-05-09 – 2012-05-10 (×5): 3.375 g via INTRAVENOUS
  Filled 2012-05-09 (×8): qty 50

## 2012-05-09 MED ORDER — ALBUTEROL SULFATE (5 MG/ML) 0.5% IN NEBU
2.5000 mg | INHALATION_SOLUTION | Freq: Four times a day (QID) | RESPIRATORY_TRACT | Status: DC
  Administered 2012-05-09 (×3): 2.5 mg via RESPIRATORY_TRACT
  Filled 2012-05-09 (×3): qty 0.5

## 2012-05-09 MED ORDER — CLOPIDOGREL BISULFATE 75 MG PO TABS
75.0000 mg | ORAL_TABLET | Freq: Every day | ORAL | Status: DC
  Administered 2012-05-10 – 2012-05-11 (×2): 75 mg via ORAL
  Filled 2012-05-09 (×3): qty 1

## 2012-05-09 MED ORDER — VANCOMYCIN HCL 1000 MG IV SOLR
750.0000 mg | Freq: Once | INTRAVENOUS | Status: AC
  Administered 2012-05-09: 750 mg via INTRAVENOUS
  Filled 2012-05-09: qty 750

## 2012-05-09 MED ORDER — HYDROMORPHONE HCL PF 1 MG/ML IJ SOLN
0.5000 mg | INTRAMUSCULAR | Status: DC | PRN
  Administered 2012-05-09: 0.5 mg via INTRAVENOUS
  Filled 2012-05-09: qty 1

## 2012-05-09 MED ORDER — ENOXAPARIN SODIUM 40 MG/0.4ML ~~LOC~~ SOLN
40.0000 mg | SUBCUTANEOUS | Status: DC
  Administered 2012-05-09 – 2012-05-11 (×3): 40 mg via SUBCUTANEOUS
  Filled 2012-05-09 (×3): qty 0.4

## 2012-05-09 MED ORDER — PIPERACILLIN-TAZOBACTAM 3.375 G IVPB: 3.3750 g | Freq: Once | INTRAVENOUS | Status: DC

## 2012-05-09 MED ORDER — VANCOMYCIN HCL IN DEXTROSE 1-5 GM/200ML-% IV SOLN: 1000.0000 mg | Freq: Once | INTRAVENOUS | Status: DC

## 2012-05-09 MED ORDER — ASPIRIN 300 MG RE SUPP
300.0000 mg | Freq: Once | RECTAL | Status: AC
  Administered 2012-05-09: 300 mg via RECTAL
  Filled 2012-05-09: qty 1

## 2012-05-09 MED ORDER — PIPERACILLIN-TAZOBACTAM 3.375 G IVPB 30 MIN
3.3750 g | Freq: Once | INTRAVENOUS | Status: AC
  Administered 2012-05-09: 3.375 g via INTRAVENOUS

## 2012-05-09 MED ORDER — SENNOSIDES-DOCUSATE SODIUM 8.6-50 MG PO TABS: 1.0000 | ORAL_TABLET | Freq: Every evening | ORAL | Status: DC | PRN

## 2012-05-09 MED ORDER — ASPIRIN 325 MG PO TABS
325.0000 mg | ORAL_TABLET | Freq: Every day | ORAL | Status: DC
  Filled 2012-05-09: qty 1

## 2012-05-09 MED ORDER — SODIUM CHLORIDE 0.9 % IV SOLN
INTRAVENOUS | Status: DC
  Administered 2012-05-09 – 2012-05-11 (×2): via INTRAVENOUS

## 2012-05-09 MED ORDER — VANCOMYCIN HCL 1000 MG IV SOLR
750.0000 mg | INTRAVENOUS | Status: DC
  Administered 2012-05-09: 750 mg via INTRAVENOUS
  Filled 2012-05-09 (×2): qty 750

## 2012-05-09 MED ORDER — ASPIRIN 300 MG RE SUPP
300.0000 mg | Freq: Every day | RECTAL | Status: DC
  Filled 2012-05-09: qty 1

## 2012-05-09 MED ORDER — POTASSIUM CHLORIDE 10 MEQ/100ML IV SOLN
10.0000 meq | INTRAVENOUS | Status: AC
  Administered 2012-05-09 – 2012-05-10 (×3): 10 meq via INTRAVENOUS
  Filled 2012-05-09 (×3): qty 100

## 2012-05-09 NOTE — Progress Notes (Signed)
SLP Cancellation Note ST received order for BSE and SLE. Attempt x1 but not completed secondary to lethargy with inability to participate fully.  RN to page SLP if LOA improves.  ST to follow for POC. Moreen Fowler M.S. CCC-SLP [phone] [facility] 05/09/2012, 1:06 PM

## 2012-05-09 NOTE — Progress Notes (Signed)
*  PRELIMINARY RESULTS* Vascular Ultrasound Carotid Duplex (Doppler) has been completed.  Preliminary findings: Right= No significant ICA stenosis. Left= 60-79% ICA stenosis, low to mid end of scale. Bilateral antegrade vertebral flow.  Farrel Demark, RDMS 05/09/2012, 11:08 AM

## 2012-05-09 NOTE — ED Notes (Signed)
Dr Otter at bedside  

## 2012-05-09 NOTE — ED Notes (Signed)
Attempted to call report; was told that the nurse was busy in another patient's room and to call back in 5 minutes.

## 2012-05-09 NOTE — Progress Notes (Signed)
PT Cancellation Note  Treatment cancelled today due to patient's family refusal to participate as she is sleeping and has not gotten any sleep recently. Will attempt evaluation this afternoon.  05/09/2012 Milana Kidney DPT PAGER: [phone] OFFICE: [phone]    Milana Kidney 05/09/2012, 8:37 AM

## 2012-05-09 NOTE — ED Notes (Signed)
Lab and family at bedside.

## 2012-05-09 NOTE — Progress Notes (Signed)
I have seen and examined pt admitted today with CVA, hyponatremia and PNA per Dr Lovell Sheehan. Appreciate neuro assistance. Pt is currently sleeping after being up all of last night per her daughter- and she would rather that I not wake her up. Will continue current management plan as per Dr Rubye Beach, recheck Na,  Follow studies and further recs pending studies and clinical course.  Dr Donnalee Curry [phone]

## 2012-05-09 NOTE — Progress Notes (Signed)
Pt is lethargic and will not wake up enough to do a neuro assessment.  Dr Lendell Caprice saw patient. Discussed situation with md and will let pt rest and metabolize ativan, will continue to monitor.

## 2012-05-09 NOTE — ED Notes (Signed)
Report given to [place], RN on 3000.  Dr. Norlene Campbell wants to hold off on temporary orders at this time ([name] wants hospitalist to see patient before patient is sent upstairs).  Lab still at bedside at this time; updated family and patient on plan of care.  Will continue to monitor.

## 2012-05-09 NOTE — ED Notes (Signed)
Calling report now. 

## 2012-05-09 NOTE — ED Notes (Signed)
Admitting orders in the computer; patient being transported upstairs on portable cardiac monitor with RN.

## 2012-05-09 NOTE — Progress Notes (Signed)
OT Cancellation Note  Treatment cancelled today due to medical issues with patient which prohibited therapy. Nsg advised to see pt tomorrow.  [facility] Ward, OTR/L  [phone] 05/09/2012 05/09/2012, 12:20 PM

## 2012-05-09 NOTE — ED Notes (Addendum)
Asked Dr. Norlene Campbell if she still wants Avelox administered (never administered at [facility] this evening); Dr. Norlene Campbell confirms order.  Will administer medication after blood cultures drawn (per Dr. Norlene Campbell request).

## 2012-05-09 NOTE — Progress Notes (Signed)
ANTIBIOTIC CONSULT NOTE - INITIAL  Pharmacy Consult for Vancocin/Zosyn Indication: rule out pneumonia  No Known Allergies  Patient Measurements: Last wt = 42.8kg this past Feb  Vital Signs: Temp: 97.9 F (36.6 C) (05/10 2346) Temp src: Oral (05/10 2340) BP: 137/74 mmHg (05/11 0114) Pulse Rate: 72  (05/11 0114) Intake/Output from previous day: 05/10 0701 - 05/11 0700 In: 700 [I.V.:700] Out: 800 [Urine:800] Intake/Output from this shift: Total I/O In: 700 [I.V.:700] Out: 800 [Urine:800]  Labs:  [facility] 05/08/12 1948  WBC 7.6  HGB 11.0*  PLT 404*  LABCREA --  CREATININE 0.52    Microbiology: No results found for this or any previous visit (from the past 720 hour(s)).  Medical History: Past Medical History  Diagnosis Date  . Colitis   . Weight loss   . Diarrhea   . CAD (coronary artery disease)   . Hyperlipidemia   . Hypertension   . Asthma   . GERD (gastroesophageal reflux disease)     Medications:  Scheduled:    . albuterol  2.5 mg Nebulization Q6H  . albuterol  5 mg Nebulization Once  . aspirin  300 mg Rectal Daily   Or  . aspirin  325 mg Oral Daily  . enoxaparin  40 mg Subcutaneous Q24H  . ipratropium  0.5 mg Nebulization Once  . moxifloxacin  400 mg Intravenous Once  . piperacillin-tazobactam (ZOSYN)  IV  3.375 g Intravenous Once  . piperacillin-tazobactam  3.375 g Intravenous Once  . piperacillin-tazobactam (ZOSYN)  IV  3.375 g Intravenous Q8H  . sodium chloride  500 mL Intravenous STAT  . vancomycin  750 mg Intravenous Once  . vancomycin  750 mg Intravenous Q24H  . DISCONTD: vancomycin  1,000 mg Intravenous Once   Assessment: 78yo female with recent treatment for PNA brought to [facility] for N/V, tx'd to [facility] for possible stroke Sx but code stroke cancelled, now to begin IV ABX for presumed PNA.  Goal of Therapy:  Vancomycin trough level 15-20 mcg/ml  Plan:  Will begin vancomycin 750mg  IV Q24H and Zosyn 3.375g IV Q8H and monitor CBC, Cx,  levels prn.  Colleen Can PharmD BCPS 05/09/2012,1:36 AM

## 2012-05-09 NOTE — Progress Notes (Signed)
Patient ID: Ariel Hunt, female   DOB: 11-May-1923, 78 y.o.   MRN: [id number] Stroke Team Progress Note  HISTORY 78y/o woman who was brought to the ED 5/10 by family. She had not been doing well for the past few days but seemed to be even worse on the day of admission. She was noted by family at 2115 on 5/10 to have an acute change with slurred speech, difficulty getting her words out and confusion. Staff was made aware and code stroke was called. Admission lab work showed a sodium of 118. PH on blood gas was 7.5. Patient diagnosed with pneumonia as well. Head CT performed as part of the work up shows an area of hypodensity in the left frontal lobe. As this was of uncertain age and unlikely to be the cause of her AMS, tPA was not given.  SUBJECTIVE Patient's son and daughter are at bedside. She was very agitated last night and received 0.5mg  IV ativan at around 0400 to help this. Nursing called me to bedside this am as she is somnolent. Patient still arouses with minor stimulation but quickly falls back to sleep. Children report she was unable to get much sleep last night. MRI is pending for this am.  OBJECTIVE Most recent Vital Signs: Temp: 98.1 F (36.7 C) (05/11 0944) Temp src: Axillary (05/11 0944) BP: 101/59 mmHg (05/11 0944) Pulse Rate: 66  (05/11 0944) Respiratory Rate: 20 O2 Saturation: 100%  CBG (last 3)  Basename 05/08/12 2120  GLUCAP 127*   Intake/Output from previous day: 05/10 0701 - 05/11 0700 In: 1026.3 [I.V.:1026.3] Out: 1300 [Urine:1300]  IV Fluid Intake:     . sodium chloride    . sodium chloride 75 mL/hr at 05/09/12 0239   Medications    . albuterol  2.5 mg Nebulization Q6H  . albuterol  5 mg Nebulization Once  . aspirin  300 mg Rectal Daily   Or  . aspirin  325 mg Oral Daily  . enoxaparin  40 mg Subcutaneous Q24H  . ipratropium  0.5 mg Nebulization Once  . LORazepam  0.5 mg Intravenous Once  . moxifloxacin  400 mg Intravenous Once  .  piperacillin-tazobactam (ZOSYN)  IV  3.375 g Intravenous Once  . piperacillin-tazobactam  3.375 g Intravenous Once  . piperacillin-tazobactam (ZOSYN)  IV  3.375 g Intravenous Q8H  . sodium chloride  500 mL Intravenous STAT  . vancomycin  750 mg Intravenous Once  . vancomycin  750 mg Intravenous Q24H  . DISCONTD: vancomycin  1,000 mg Intravenous Once  PRN HYDROmorphone (DILAUDID) injection, senna-docusate  Diet:  NPO  Activity:  Bathroom privileges; Up with assistance DVT Prophylaxis:  Lovenox  Studies: CBC    Component Value Date/Time   WBC 7.6 05/08/2012 1948   RBC 3.57* 05/08/2012 1948   HGB 11.0* 05/08/2012 1948   HCT 31.9* 05/08/2012 1948   PLT 404* 05/08/2012 1948   MCV 89.4 05/08/2012 1948   MCH 30.8 05/08/2012 1948   MCHC 34.5 05/08/2012 1948   RDW 13.0 05/08/2012 1948   LYMPHSABS 1.2 05/08/2012 1948   MONOABS 0.9 05/08/2012 1948   EOSABS 0.4 05/08/2012 1948   BASOSABS 0.0 05/08/2012 1948   CMP    Component Value Date/Time   NA 118* 05/08/2012 1948   K 3.5 05/08/2012 1948   CL 85* 05/08/2012 1948   CO2 19 05/08/2012 1948   GLUCOSE 145* 05/08/2012 1948   BUN 6 05/08/2012 1948   CREATININE 0.52 05/08/2012 1948   CALCIUM 6.9* 05/08/2012 1948  PROT 5.9* 05/08/2012 1948   ALBUMIN 3.2* 05/08/2012 1948   AST 17 05/08/2012 1948   ALT 12 05/08/2012 1948   ALKPHOS 33* 05/08/2012 1948   BILITOT 0.3 05/08/2012 1948   GFRNONAA 84* 05/08/2012 1948   GFRAA >90 05/08/2012 1948   COAGS Lab Results  Component Value Date   INR 1.03 05/08/2012   Lipid Panel No results found for this basename: chol, trig, hdl, cholhdl, vldl, ldlcalc   HgbA1C  No results found for this basename: HGBA1C   Urine Drug Screen  No results found for this basename: labopia, cocainscrnur, labbenz, amphetmu, thcu, labbarb    Alcohol Level No results found for this basename: eth     Results for orders placed during the hospital encounter of 05/08/12 (from the past 24 hour(s))  CBC     Status: Abnormal   Collection  Time   05/08/12  7:48 PM      Component Value Range   WBC 7.6  4.0 - 10.5 (K/uL)   RBC 3.57 (*) 3.87 - 5.11 (MIL/uL)   Hemoglobin 11.0 (*) 12.0 - 15.0 (g/dL)   HCT 98.1 (*) 19.1 - 46.0 (%)   MCV 89.4  78.0 - 100.0 (fL)   MCH 30.8  26.0 - 34.0 (pg)   MCHC 34.5  30.0 - 36.0 (g/dL)   RDW 47.8  29.5 - 62.1 (%)   Platelets 404 (*) 150 - 400 (K/uL)  DIFFERENTIAL     Status: Normal   Collection Time   05/08/12  7:48 PM      Component Value Range   Neutrophils Relative 67  43 - 77 (%)   Neutro Abs 5.1  1.7 - 7.7 (K/uL)   Lymphocytes Relative 16  12 - 46 (%)   Lymphs Abs 1.2  0.7 - 4.0 (K/uL)   Monocytes Relative 11  3 - 12 (%)   Monocytes Absolute 0.9  0.1 - 1.0 (K/uL)   Eosinophils Relative 5  0 - 5 (%)   Eosinophils Absolute 0.4  0.0 - 0.7 (K/uL)   Basophils Relative 1  0 - 1 (%)   Basophils Absolute 0.0  0.0 - 0.1 (K/uL)  COMPREHENSIVE METABOLIC PANEL     Status: Abnormal   Collection Time   05/08/12  7:48 PM      Component Value Range   Sodium 118 (*) 135 - 145 (mEq/L)   Potassium 3.5  3.5 - 5.1 (mEq/L)   Chloride 85 (*) 96 - 112 (mEq/L)   CO2 19  19 - 32 (mEq/L)   Glucose, Bld 145 (*) 70 - 99 (mg/dL)   BUN 6  6 - 23 (mg/dL)   Creatinine, Ser 3.08  0.50 - 1.10 (mg/dL)   Calcium 6.9 (*) 8.4 - 10.5 (mg/dL)   Total Protein 5.9 (*) 6.0 - 8.3 (g/dL)   Albumin 3.2 (*) 3.5 - 5.2 (g/dL)   AST 17  0 - 37 (U/L)   ALT 12  0 - 35 (U/L)   Alkaline Phosphatase 33 (*) 39 - 117 (U/L)   Total Bilirubin 0.3  0.3 - 1.2 (mg/dL)   GFR calc non Af Amer 84 (*) >90 (mL/min)   GFR calc Af Amer >90  >90 (mL/min)  BLOOD GAS, ARTERIAL     Status: Abnormal   Collection Time   05/08/12  9:00 PM      Component Value Range   O2 Content 2.0     Delivery systems NASAL CANNULA     pH, Arterial 7.506 (*) 7.350 -  7.400    pCO2 arterial 26.3 (*) 35.0 - 45.0 (mmHg)   pO2, Arterial 120.0 (*) 80.0 - 100.0 (mmHg)   Bicarbonate 20.6  20.0 - 24.0 (mEq/L)   TCO2 18.8  0 - 100 (mmol/L)   Acid-base deficit 1.2   0.0 - 2.0 (mmol/L)   O2 Saturation 98.8     Patient temperature 98.6     Collection site RIGHT RADIAL     Drawn by [id number]     Sample type ARTERIAL DRAW     Allens test (pass/fail) PASS  PASS   GLUCOSE, CAPILLARY     Status: Abnormal   Collection Time   05/08/12  9:20 PM      Component Value Range   Glucose-Capillary 127 (*) 70 - 99 (mg/dL)  PROTIME-INR     Status: Normal   Collection Time   05/08/12 11:08 PM      Component Value Range   Prothrombin Time 13.7  11.6 - 15.2 (seconds)   INR 1.03  0.00 - 1.49   APTT     Status: Normal   Collection Time   05/08/12 11:08 PM      Component Value Range   aPTT 33  24 - 37 (seconds)    Ct Head Wo Contrast  05/08/2012  *RADIOLOGY REPORT*  Clinical Data: The altered mental status.  Weakness.  Shortness of breath.  Fatigue.  CT HEAD WITHOUT CONTRAST  Technique:  Contiguous axial images were obtained from the base of the skull through the vertex without contrast.  Comparison: None.  Findings: Diffuse bilateral cerebral atrophy.  Mild ventricular dilatation consistent with central atrophy.  There is a vague low attenuation change in the left frontal temporal region which appears to spare the cortex.  There is suggestion of mild sulci effacement.  Early changes of acute infarct are suggested.  Diffuse low attenuation change throughout the deep white matter consistent with underlying small vessel ischemic changes.  No evidence of acute intracranial hemorrhage.  No effacement the basilar cisterns. No abnormal extra-axial fluid collections.  No depressed skull fractures.  Visualized paranasal sinuses and mastoid air cells are not opacified.  IMPRESSION: Low attenuation change in the left frontal temporal region with mild sulci effacement suggests early changes of acute infarct in the distribution of the left middle cerebral artery.  Chronic atrophy and small vessel ischemic changes.  No evidence of acute intracranial hemorrhage.  Original Report Authenticated By:  Marlon Pel, M.D.   Dg Chest Port 1 View  05/08/2012  *RADIOLOGY REPORT*  Clinical Data: Cough and shortness of breath.  PORTABLE CHEST - 1 VIEW  Comparison: PA and lateral chest 05/06/2012.  Findings: Small left effusion and basilar airspace disease have increased.  There is some subsegmental atelectasis in the right base.  No pneumothorax.  Heart size upper normal.  Lungs appear emphysematous.  IMPRESSION: Increased left effusion and basilar airspace disease compatible with pneumonia.  Original Report Authenticated By: Bernadene Bell. D'ALESSIO, M.D.   MRI of the brain  pending  2D Echocardiogram  pending  Carotid Doppler/TCD  pending  Physical Exam   GENERAL:   Well nourished, well hydrated, no acute distress.   CARDIOVASCULAR:   Regular rate and rhythm, no thrills or palpable murmurs, S1, S2, no murmur, no rubs or gallops.      Carotid arteries: No carotid bruits.   RESPIRATORY:  Upper airway noise and peripheral wheezing. Just received neb tx.  EXTREMITIES:  No rashes or lesions     No peripheral  edema, cyanosis, or clubbing   MENTAL STATUS EXAM:    Orientation:  Somnolent but arousable to verbal stimulation. Will follow one-step commands.      Language:  Speech is slurred due to somnolence. Unable to assess language.Marland Kitchen   CRANIAL NERVES:     CN 2 (Optic):  Intact BTT     CN 3,4,6 (EOM):  Pupils equal and reactive to light and near full eye movement without nystagmus.      CN 5 (Trigeminal):  Facial sensation is normal, no weakness of masticatory muscles.      CN 7 (Facial):  No facial weakness or asymmetry.      CN 8 (Auditory):  Auditory acuity grossly normal.      CN 12 (Hypoglossal):  The tongue is midline. No atrophy or fasciculations.   MOTOR:    Moves all extremities to stimulation. No obvious asymmetry.  REFLEXES:     Triceps:                 (R): 2+  (L): 2+      Biceps:                  (R): 2+  (L): 2+      Brachioradialis:     (R): 2+  (L): 2+      Patellar:                  (R): 1+  (L): 1+      Achilles:                 (R): absent  (L): absent      Hoffman:    (R): absent  (L): absent      Babinski:    (R): absent  (L): absent   COORDINATION:     Unable to follow testing.  SENSATION:     Responds to touch throughout.   GAIT:     Unable to follow testing.  ASSESSMENT Ariel Hunt is a 78 y.o. female with a left frontal infarct of unknown age on CT, secondary to small vessel disease. On aspirin 325 mg orally every day for secondary stroke prevention.  Stroke risk factors:  hypertension  Hospital day # 1  TREATMENT/PLAN -Agree with Dr. Ferne Coe recommendation to change aspirin to Plavix 75mg  daily. -Worsened somnolence this am likely due to Ativan dose. Vitals are stable and patient easily woken. Will monitor. -After patient is more alert, will get MRI brain today. Further stroke workup, including carotid ultrasound and TTE are pending. -Infarct noted on her CT scan is unlikely to be the etiology of her worsened mental status. Will reassess after MRI is complete.  Kipp Laurence, MD [facility] Pager: [phone] 05/09/2012 10:28 AM

## 2012-05-09 NOTE — H&P (Addendum)
DATE OF ADMISSION:  05/09/2012  PCP:    Daisy Floro, MD, MD   Chief Complaint:   Disoriented    HPI: Ariel Hunt is an 78 y.o. female who had been seen and declining over the past 48 hours per report of her daughter and son.  She had increased confusion and was described as being disoriented.  She had been ill 10 days ago with a stomach virus, and had increased nausea and vomiting.  She was seen by her PCP and diagnosed as well with pneumonia and placed on Avelox and had 2 doses of Avelox before her decline over the past 2 days.  Her family contacted her PCP and was advised to take her to the hospital for evaluation.  When she arrived in the ED,      she arrived as a Code stroke and was seen by Neurology Dr. Thad Ranger.  The ct scan of the brain revealed an acute infarction of the Left MCA artery , and the onset of her symptoms were not clear.   She was also found to have pneumonia on the chest X-ray, and a sodium level of 118.       Past Medical History  Diagnosis Date  . Colitis   . Weight loss   . Diarrhea   . CAD (coronary artery disease)   . Hyperlipidemia   . Hypertension   . Asthma   . GERD (gastroesophageal reflux disease)     Past Surgical History  Procedure Date  . Hernia repair   . Coronary angioplasty with stent placement     Medications:  HOME MEDS: Prior to Admission medications   Medication Sig Start Date End Date Taking? Authorizing Provider  aspirin 81 MG tablet Take 81 mg by mouth daily.     Yes Historical Provider, MD  budesonide (ENTOCORT EC) 3 MG 24 hr capsule Take 2 capsules (6 mg total) by mouth daily. 02/10/12 02/09/13 Yes Rachael Fee, MD  calcium-vitamin D (OSCAL WITH D) 250-125 MG-UNIT per tablet Take 1 tablet by mouth 2 (two) times daily.     Yes Historical Provider, MD  escitalopram (LEXAPRO) 10 MG tablet Take 5 mg by mouth daily.     Yes Historical Provider, MD  Fluticasone-Salmeterol (ADVAIR DISKUS) 100-50 MCG/DOSE AEPB Inhale 1 puff into  the lungs every 12 (twelve) hours.     Yes Historical Provider, MD  HYDROcodone-acetaminophen (VICODIN) 5-500 MG per tablet Take 1 tablet by mouth every 6 (six) hours as needed. For pain relief   Yes Historical Provider, MD  moxifloxacin (AVELOX) 400 MG tablet Take 400 mg by mouth 2 (two) times daily.   Yes Historical Provider, MD  Multiple Vitamin (MULTIVITAMIN) tablet Take 1 tablet by mouth daily.     Yes Historical Provider, MD  pravastatin (PRAVACHOL) 80 MG tablet Take 40 mg by mouth daily.     Yes Historical Provider, MD  traMADol (ULTRAM) 50 MG tablet Take 50 mg by mouth every 6 (six) hours as needed. For pain relief   Yes Historical Provider, MD    Allergies:  No Known Allergies  Social History:   reports that she quit smoking about 21 years ago. She has never used smokeless tobacco. She reports that she does not drink alcohol or use illicit drugs.  Family History: Family History  Problem Relation Age of Onset  . Colon cancer Father     Review of Systems: The patient denies anorexia, fever, weight loss,, vision loss, decreased hearing, hoarseness, chest pain,  syncope, dyspnea on exertion, peripheral edema, balance deficits, hemoptysis, abdominal pain, melena, hematochezia, severe indigestion/heartburn, hematuria, incontinence, genital  sores, muscle weakness, suspicious skin lesions, transient blindness, difficulty walking, depression, unusual weight change, abnormal bleeding, enlarged lymph nodes, angioedema, and breast masses.   Physical Exam:  GEN:  Agitated Elderly 78 year old Caucasian female  examined  and in no acute distress; cooperative with exam Filed Vitals:   05/08/12 2340 05/08/12 2346 05/09/12 0030 05/09/12 0114  BP: 143/82  127/78 137/74  Pulse: 79  73 72  Temp: 97.4 F (36.3 C) 97.9 F (36.6 C)    TempSrc: Oral     Resp: 16  19 18   SpO2: 100%  99% 99%   Blood pressure 137/74, pulse 72, temperature 97.9 F (36.6 C), temperature source Oral, resp. rate 18,  SpO2 99.00%.CHEST: Normal respiration, clear to auscultation bilaterally PSYCH: She is alert and oriented x1; does not appear anxious does not appear depressed; affect is normal HEENT: Normocephalic and Atraumatic, Mucous membranes pink; PERRLA; EOM intact; Fundi:  Benign;  No scleral icterus, Nares: Patent, Oropharynx: Clear, Edentulous;  Neck:  FROM, no cervical lymphadenopathy nor thyromegaly or carotid bruit; no JVD; Breasts:: Not examined CHEST WALL: No tenderness  HEART: Regular rate and rhythm; no murmurs rubs or gallops BACK: No kyphosis or scoliosis; no CVA tenderness ABDOMEN: Positive Bowel Sounds, soft non-tender; no masses, no organomegaly, no pannus; no intertriginous candida. Rectal Exam: Not done EXTREMITIES: No bone or joint deformity; age-appropriate arthropathy of the hands and knees; no cyanosis, clubbing or edema; no ulcerations. Genitalia: not examined PULSES: 2+ and symmetric SKIN: Normal hydration no rash or ulceration CNS: Cranial nerves 2-12 grossly intact no focal neurologic deficit, Able to move all 4 exts, and cooperate with commands   Labs & Imaging Results for orders placed during the hospital encounter of 05/08/12 (from the past 48 hour(s))  CBC     Status: Abnormal   Collection Time   05/08/12  7:48 PM      Component Value Range Comment   WBC 7.6  4.0 - 10.5 (K/uL)    RBC 3.57 (*) 3.87 - 5.11 (MIL/uL)    Hemoglobin 11.0 (*) 12.0 - 15.0 (g/dL)    HCT 81.1 (*) 91.4 - 46.0 (%)    MCV 89.4  78.0 - 100.0 (fL)    MCH 30.8  26.0 - 34.0 (pg)    MCHC 34.5  30.0 - 36.0 (g/dL)    RDW 78.2  95.6 - 21.3 (%)    Platelets 404 (*) 150 - 400 (K/uL)   DIFFERENTIAL     Status: Normal   Collection Time   05/08/12  7:48 PM      Component Value Range Comment   Neutrophils Relative 67  43 - 77 (%)    Neutro Abs 5.1  1.7 - 7.7 (K/uL)    Lymphocytes Relative 16  12 - 46 (%)    Lymphs Abs 1.2  0.7 - 4.0 (K/uL)    Monocytes Relative 11  3 - 12 (%)    Monocytes Absolute 0.9   0.1 - 1.0 (K/uL)    Eosinophils Relative 5  0 - 5 (%)    Eosinophils Absolute 0.4  0.0 - 0.7 (K/uL)    Basophils Relative 1  0 - 1 (%)    Basophils Absolute 0.0  0.0 - 0.1 (K/uL)   COMPREHENSIVE METABOLIC PANEL     Status: Abnormal   Collection Time   05/08/12  7:48 PM  Component Value Range Comment   Sodium 118 (*) 135 - 145 (mEq/L)    Potassium 3.5  3.5 - 5.1 (mEq/L)    Chloride 85 (*) 96 - 112 (mEq/L)    CO2 19  19 - 32 (mEq/L)    Glucose, Bld 145 (*) 70 - 99 (mg/dL)    BUN 6  6 - 23 (mg/dL)    Creatinine, Ser 1.61  0.50 - 1.10 (mg/dL)    Calcium 6.9 (*) 8.4 - 10.5 (mg/dL)    Total Protein 5.9 (*) 6.0 - 8.3 (g/dL)    Albumin 3.2 (*) 3.5 - 5.2 (g/dL)    AST 17  0 - 37 (U/L)    ALT 12  0 - 35 (U/L)    Alkaline Phosphatase 33 (*) 39 - 117 (U/L)    Total Bilirubin 0.3  0.3 - 1.2 (mg/dL)    GFR calc non Af Amer 84 (*) >90 (mL/min)    GFR calc Af Amer >90  >90 (mL/min)   BLOOD GAS, ARTERIAL     Status: Abnormal   Collection Time   05/08/12  9:00 PM      Component Value Range Comment   O2 Content 2.0      Delivery systems NASAL CANNULA      pH, Arterial 7.506 (*) 7.350 - 7.400     pCO2 arterial 26.3 (*) 35.0 - 45.0 (mmHg)    pO2, Arterial 120.0 (*) 80.0 - 100.0 (mmHg)    Bicarbonate 20.6  20.0 - 24.0 (mEq/L)    TCO2 18.8  0 - 100 (mmol/L)    Acid-base deficit 1.2  0.0 - 2.0 (mmol/L)    O2 Saturation 98.8      Patient temperature 98.6      Collection site RIGHT RADIAL      Drawn by [id number]      Sample type ARTERIAL DRAW      Allens test (pass/fail) PASS  PASS    GLUCOSE, CAPILLARY     Status: Abnormal   Collection Time   05/08/12  9:20 PM      Component Value Range Comment   Glucose-Capillary 127 (*) 70 - 99 (mg/dL)   PROTIME-INR     Status: Normal   Collection Time   05/08/12 11:08 PM      Component Value Range Comment   Prothrombin Time 13.7  11.6 - 15.2 (seconds)    INR 1.03  0.00 - 1.49    APTT     Status: Normal   Collection Time   05/08/12 11:08 PM       Component Value Range Comment   aPTT 33  24 - 37 (seconds)    Ct Head Wo Contrast  05/08/2012  *RADIOLOGY REPORT*  Clinical Data: The altered mental status.  Weakness.  Shortness of breath.  Fatigue.  CT HEAD WITHOUT CONTRAST  Technique:  Contiguous axial images were obtained from the base of the skull through the vertex without contrast.  Comparison: None.  Findings: Diffuse bilateral cerebral atrophy.  Mild ventricular dilatation consistent with central atrophy.  There is a vague low attenuation change in the left frontal temporal region which appears to spare the cortex.  There is suggestion of mild sulci effacement.  Early changes of acute infarct are suggested.  Diffuse low attenuation change throughout the deep white matter consistent with underlying small vessel ischemic changes.  No evidence of acute intracranial hemorrhage.  No effacement the basilar cisterns. No abnormal extra-axial fluid collections.  No depressed skull fractures.  Visualized paranasal sinuses and mastoid air cells are not opacified.  IMPRESSION: Low attenuation change in the left frontal temporal region with mild sulci effacement suggests early changes of acute infarct in the distribution of the left middle cerebral artery.  Chronic atrophy and small vessel ischemic changes.  No evidence of acute intracranial hemorrhage.  Original Report Authenticated By: Marlon Pel, M.D.   Dg Chest Port 1 View  05/08/2012  *RADIOLOGY REPORT*  Clinical Data: Cough and shortness of breath.  PORTABLE CHEST - 1 VIEW  Comparison: PA and lateral chest 05/06/2012.  Findings: Small left effusion and basilar airspace disease have increased.  There is some subsegmental atelectasis in the right base.  No pneumothorax.  Heart size upper normal.  Lungs appear emphysematous.  IMPRESSION: Increased left effusion and basilar airspace disease compatible with pneumonia.  Original Report Authenticated By: Bernadene Bell. Maricela Curet, M.D.       Assessment: Present on Admission:  .CVA (cerebral infarction) .Community acquired pneumonia .Hyponatremia .CAD (coronary artery disease) .Hypertension .Hyperlipidemia   Plan:    Admitted for CVA  To Telemetry Bed, Neuro Checks ordered. IV Vanc and Zosyn and Nebs and O2 for Pneumonia IVFs and monitor Na+ levels CAD, HTN, stable Hyperlipidemia check fasting lips.   Other plans as per orders.    CODE STATUS:      DO NOT RESUSCITATE (DNR)     DO NOT INTUBATE (DNI)    Neliah Cuyler C 05/09/2012, 2:17 AM

## 2012-05-09 NOTE — Progress Notes (Signed)
  Echocardiogram 2D Echocardiogram has been performed.  Cathie Beams Deneen 05/09/2012, 12:26 PM

## 2012-05-10 DIAGNOSIS — E871 Hypo-osmolality and hyponatremia: Secondary | ICD-10-CM

## 2012-05-10 DIAGNOSIS — I6330 Cerebral infarction due to thrombosis of unspecified cerebral artery: Secondary | ICD-10-CM

## 2012-05-10 DIAGNOSIS — G9341 Metabolic encephalopathy: Secondary | ICD-10-CM

## 2012-05-10 DIAGNOSIS — R4789 Other speech disturbances: Secondary | ICD-10-CM

## 2012-05-10 DIAGNOSIS — I6340 Cerebral infarction due to embolism of unspecified cerebral artery: Secondary | ICD-10-CM

## 2012-05-10 DIAGNOSIS — J159 Unspecified bacterial pneumonia: Secondary | ICD-10-CM

## 2012-05-10 LAB — BASIC METABOLIC PANEL
BUN: 6 mg/dL (ref 6–23)
CO2: 21 mEq/L (ref 19–32)
Chloride: 104 mEq/L (ref 96–112)
Creatinine, Ser: 0.6 mg/dL (ref 0.50–1.10)
GFR calc Af Amer: >90 mL/min (ref >90)
Potassium: 3.4 mEq/L — ABNORMAL LOW (ref 3.5–5.1)

## 2012-05-10 LAB — MAGNESIUM: Magnesium: 2 mg/dL (ref 1.5–2.5)

## 2012-05-10 MED ORDER — MOXIFLOXACIN HCL IN NACL 400 MG/250ML IV SOLN
400.0000 mg | INTRAVENOUS | Status: DC
  Administered 2012-05-10: 400 mg via INTRAVENOUS
  Filled 2012-05-10 (×2): qty 250

## 2012-05-10 MED ORDER — ESCITALOPRAM OXALATE 5 MG PO TABS
5.0000 mg | ORAL_TABLET | Freq: Every day | ORAL | Status: DC
  Administered 2012-05-10 – 2012-05-11 (×2): 5 mg via ORAL
  Filled 2012-05-10 (×2): qty 1

## 2012-05-10 MED ORDER — FLUTICASONE-SALMETEROL 100-50 MCG/DOSE IN AEPB
1.0000 | INHALATION_SPRAY | Freq: Two times a day (BID) | RESPIRATORY_TRACT | Status: DC
  Administered 2012-05-10 – 2012-05-11 (×2): 1 via RESPIRATORY_TRACT
  Filled 2012-05-10: qty 14

## 2012-05-10 MED ORDER — HYDROCODONE-ACETAMINOPHEN 5-500 MG PO TABS: 1.0000 | ORAL_TABLET | Freq: Four times a day (QID) | ORAL | Status: DC | PRN

## 2012-05-10 MED ORDER — ALBUTEROL SULFATE (5 MG/ML) 0.5% IN NEBU
2.5000 mg | INHALATION_SOLUTION | Freq: Three times a day (TID) | RESPIRATORY_TRACT | Status: DC
  Administered 2012-05-10 – 2012-05-11 (×4): 2.5 mg via RESPIRATORY_TRACT
  Filled 2012-05-10 (×5): qty 0.5

## 2012-05-10 MED ORDER — SIMVASTATIN 20 MG PO TABS
20.0000 mg | ORAL_TABLET | Freq: Every day | ORAL | Status: DC
  Administered 2012-05-10: 20 mg via ORAL
  Filled 2012-05-10 (×2): qty 1

## 2012-05-10 MED ORDER — POTASSIUM CHLORIDE CRYS ER 20 MEQ PO TBCR
40.0000 meq | EXTENDED_RELEASE_TABLET | Freq: Once | ORAL | Status: AC
  Administered 2012-05-10: 40 meq via ORAL
  Filled 2012-05-10: qty 2

## 2012-05-10 MED ORDER — HYDROCODONE-ACETAMINOPHEN 5-325 MG PO TABS: 1.0000 | ORAL_TABLET | Freq: Four times a day (QID) | ORAL | Status: DC | PRN

## 2012-05-10 MED ORDER — TRAMADOL HCL 50 MG PO TABS
50.0000 mg | ORAL_TABLET | Freq: Four times a day (QID) | ORAL | Status: DC | PRN
  Filled 2012-05-10: qty 1

## 2012-05-10 MED ORDER — ALBUTEROL SULFATE (5 MG/ML) 0.5% IN NEBU: 2.5000 mg | INHALATION_SOLUTION | Freq: Four times a day (QID) | RESPIRATORY_TRACT | Status: DC | PRN

## 2012-05-10 NOTE — Progress Notes (Signed)
Subjective: Pt alert and conversant, asking for coffee. Denies any Hunt/o, denies focal weakness Objective: Vital signs in last 24 hours: Temp:  [98 F (36.7 Hunt)-98.6 F (37 Hunt)] 98.1 F (36.7 Hunt) (05/12 1003) Pulse Rate:  [65-81] 81  (05/12 1003) Resp:  [18-20] 18  (05/12 1003) BP: (109-134)/(63-72) 116/65 mmHg (05/12 1003) SpO2:  [93 %-100 %] 95 % (05/12 1003) Last BM Date: 05/09/12 Intake/Output from previous day: 05/11 0701 - 05/12 0700 In: -  Out: 3900 [Urine:3900] Intake/Output this shift:      General Appearance:    Alert and oriented x3, cooperative, no distress, appears stated age  Lungs:     Clear to auscultation bilaterally, respirations unlabored   Heart:    Regular rate and rhythm, S1 and S2 normal, no murmur, rub   or gallop  Abdomen:     Soft, non-tender, bowel sounds present,    no masses, no organomegaly  Extremities:   no cyanosis or edema  Neurologic:   CNII-XII intact, normal strength, no focal weakness       Weight change:   Intake/Output Summary (Last 24 hours) at 05/10/12 1012 Last data filed at 05/10/12 0700  Gross per 24 hour  Intake      0 ml  Output   2400 ml  Net  -2400 ml    Lab Results:   Basename 05/10/12 0500 05/09/12 1959  NA 136 133*  K 3.4* 2.9*  CL 104 102  CO2 21 22  GLUCOSE 77 88  BUN 6 6  CREATININE 0.60 0.62  CALCIUM 7.0* 7.1*    Basename 05/08/12 1948  WBC 7.6  HGB 11.0*  HCT 31.9*  PLT 404*  MCV 89.4   PT/INR  Basename 05/08/12 2308  LABPROT 13.7  INR 1.03   ABG  Basename 05/08/12 2100  PHART 7.506*  HCO3 20.6    Micro Results: Recent Results (from the past 240 hour(s))  CULTURE, BLOOD (ROUTINE X 2)     Status: Normal (Preliminary result)   Collection Time   05/09/12 12:30 AM      Component Value Range Status Comment   Specimen Description BLOOD RIGHT ARM   Final    Special Requests BOTTLES DRAWN AEROBIC AND ANAEROBIC 3CC   Final    Culture  Setup Time [id number]   Final    Culture     Final    Value:        BLOOD CULTURE RECEIVED NO GROWTH TO DATE CULTURE WILL BE HELD FOR 5 DAYS BEFORE ISSUING A FINAL NEGATIVE REPORT   Report Status PENDING   Incomplete   CULTURE, BLOOD (ROUTINE X 2)     Status: Normal (Preliminary result)   Collection Time   05/09/12 12:45 AM      Component Value Range Status Comment   Specimen Description BLOOD RIGHT HAND   Final    Special Requests BOTTLES DRAWN AEROBIC AND ANAEROBIC 1CC   Final    Culture  Setup Time [id number]   Final    Culture     Final    Value:        BLOOD CULTURE RECEIVED NO GROWTH TO DATE CULTURE WILL BE HELD FOR 5 DAYS BEFORE ISSUING A FINAL NEGATIVE REPORT   Report Status PENDING   Incomplete    Studies/Results: Dg Chest 2 View  05/09/2012  *RADIOLOGY REPORT*  Clinical Data: Stroke  CHEST - 2 VIEW  Comparison: 05/08/2012 and 05/06/2012.  Findings: The cardiomediastinal silhouette is stable.  Mild levoscoliosis  again noted.  No pulmonary edema.  Small left pleural effusion with left basilar atelectasis or infiltrate again noted. Stable compression deformity mid thoracic spine.  IMPRESSION: Small left pleural effusion with left basilar atelectasis or infiltrate.  No pulmonary edema.  Original Report Authenticated By: Natasha Mead, M.D.   Ct Head Wo Contrast  05/08/2012  *RADIOLOGY REPORT*  Clinical Data: The altered mental status.  Weakness.  Shortness of breath.  Fatigue.  CT HEAD WITHOUT CONTRAST  Technique:  Contiguous axial images were obtained from the base of the skull through the vertex without contrast.  Comparison: None.  Findings: Diffuse bilateral cerebral atrophy.  Mild ventricular dilatation consistent with central atrophy.  There is a vague low attenuation change in the left frontal temporal region which appears to spare the cortex.  There is suggestion of mild sulci effacement.  Early changes of acute infarct are suggested.  Diffuse low attenuation change throughout the deep white matter consistent with underlying small vessel  ischemic changes.  No evidence of acute intracranial hemorrhage.  No effacement the basilar cisterns. No abnormal extra-axial fluid collections.  No depressed skull fractures.  Visualized paranasal sinuses and mastoid air cells are not opacified.  IMPRESSION: Low attenuation change in the left frontal temporal region with mild sulci effacement suggests early changes of acute infarct in the distribution of the left middle cerebral artery.  Chronic atrophy and small vessel ischemic changes.  No evidence of acute intracranial hemorrhage.  Original Report Authenticated By: Marlon Pel, M.D.   Mr Brain Wo Contrast  05/09/2012  *RADIOLOGY REPORT*  Clinical Data:  Stroke.  Increased confusion.  Disoriented. Abnormal CT scan.  MRI HEAD WITHOUT CONTRAST MRA HEAD WITHOUT CONTRAST  Technique:  Multiplanar, multiecho pulse sequences of the brain and surrounding structures were obtained without intravenous contrast. Angiographic images of the head were obtained using MRA technique without contrast.  Comparison:  CT head without contrast 05/08/2012.  MRI HEAD  Findings:  The diffusion weighted images demonstrate no evidence for acute or subacute infarction.  There was significant motion on the prior head CT and the area in question is likely due to beam hardening artifact associated with the motion.  Atrophy and extensive white matter disease is present bilaterally. The ventricles are proportionate to the degree of atrophy.  No significant extra-axial fluid collection is present.  Flow is present in the major intracranial arteries.  The patient is status post right lens extraction.  There is scattered opacification of anterior ethmoid air cells.  A polyp or mucous retention cyst is present in the medial right maxillary sinus. Fluid is present in the mastoid air cells bilaterally, worse on the left.  IMPRESSION:  1.  No evidence for acute infarction.  Please see the above discussion. 2.  Atrophy and extensive white  matter disease.  This likely reflects the sequelae of chronic microvascular ischemia. 3.  Bilateral mastoid effusions.  No obstructing nasopharyngeal lesion is evident.  MRA HEAD  Findings: The internal carotid arteries are within normal limits from high cervical segments through the ICA termini.  There is minimal irregularity within the A1 and M1 segments bilaterally.  No significant stenosis is present.  The anterior communicating artery is patent.  The study is mildly degraded by patient motion.  There is some attenuation of distal MCA branch vessels, worse on the left.  The vertebral arteries are codominant.  The PICA origins are visualized and within normal limits.  The right superior cerebellar artery is duplicated, a normal variant.  Both posterior cerebral arteries originate from basilar tip.  There is some attenuation of distal PCA branch vessels.  IMPRESSION:  1.  Mild small vessel disease. 2.  No significant proximal stenosis, aneurysm, or branch vessel occlusion.  Original Report Authenticated By: Jamesetta Orleans. MATTERN, M.D.   Dg Chest Port 1 View  05/08/2012  *RADIOLOGY REPORT*  Clinical Data: Cough and shortness of breath.  PORTABLE CHEST - 1 VIEW  Comparison: PA and lateral chest 05/06/2012.  Findings: Small left effusion and basilar airspace disease have increased.  There is some subsegmental atelectasis in the right base.  No pneumothorax.  Heart size upper normal.  Lungs appear emphysematous.  IMPRESSION: Increased left effusion and basilar airspace disease compatible with pneumonia.  Original Report Authenticated By: Bernadene Bell. Maricela Curet, M.D.   Mr Maxine Glenn Head/brain Wo Cm  05/09/2012  *RADIOLOGY REPORT*  Clinical Data:  Stroke.  Increased confusion.  Disoriented. Abnormal CT scan.  MRI HEAD WITHOUT CONTRAST MRA HEAD WITHOUT CONTRAST  Technique:  Multiplanar, multiecho pulse sequences of the brain and surrounding structures were obtained without intravenous contrast. Angiographic images of the  head were obtained using MRA technique without contrast.  Comparison:  CT head without contrast 05/08/2012.  MRI HEAD  Findings:  The diffusion weighted images demonstrate no evidence for acute or subacute infarction.  There was significant motion on the prior head CT and the area in question is likely due to beam hardening artifact associated with the motion.  Atrophy and extensive white matter disease is present bilaterally. The ventricles are proportionate to the degree of atrophy.  No significant extra-axial fluid collection is present.  Flow is present in the major intracranial arteries.  The patient is status post right lens extraction.  There is scattered opacification of anterior ethmoid air cells.  A polyp or mucous retention cyst is present in the medial right maxillary sinus. Fluid is present in the mastoid air cells bilaterally, worse on the left.  IMPRESSION:  1.  No evidence for acute infarction.  Please see the above discussion. 2.  Atrophy and extensive white matter disease.  This likely reflects the sequelae of chronic microvascular ischemia. 3.  Bilateral mastoid effusions.  No obstructing nasopharyngeal lesion is evident.  MRA HEAD  Findings: The internal carotid arteries are within normal limits from high cervical segments through the ICA termini.  There is minimal irregularity within the A1 and M1 segments bilaterally.  No significant stenosis is present.  The anterior communicating artery is patent.  The study is mildly degraded by patient motion.  There is some attenuation of distal MCA branch vessels, worse on the left.  The vertebral arteries are codominant.  The PICA origins are visualized and within normal limits.  The right superior cerebellar artery is duplicated, a normal variant.  Both posterior cerebral arteries originate from basilar tip.  There is some attenuation of distal PCA branch vessels.  IMPRESSION:  1.  Mild small vessel disease. 2.  No significant proximal stenosis,  aneurysm, or branch vessel occlusion.  Original Report Authenticated By: Jamesetta Orleans. MATTERN, M.D.   Vascular Ultrasound  Carotid Duplex (Doppler) has been completed. Preliminary findings: Right= No significant ICA stenosis. Left= 60-79% ICA stenosis, low to mid end of scale.  Bilateral antegrade vertebral flow.     Medications: Scheduled Meds:   . albuterol  2.5 mg Nebulization TID  . aspirin  300 mg Rectal Once  . clopidogrel  75 mg Oral Q breakfast  . enoxaparin  40 mg Subcutaneous Q24H  .  piperacillin-tazobactam (ZOSYN)  IV  3.375 g Intravenous Once  . piperacillin-tazobactam (ZOSYN)  IV  3.375 g Intravenous Q8H  . potassium chloride  10 mEq Intravenous Q1 Hr x 3  . vancomycin  750 mg Intravenous Q24H  . DISCONTD: albuterol  2.5 mg Nebulization Q6H  . DISCONTD: aspirin  300 mg Rectal Daily  . DISCONTD: aspirin  325 mg Oral Daily   Continuous Infusions:   . sodium chloride    . sodium chloride 75 mL/hr at 05/09/12 0239   PRN Meds:.albuterol, HYDROmorphone (DILAUDID) injection, senna-docusate Assessment/Plan: .Community acquired pneumonia -f/u cxr with atx vs infiltrate, will change abx to avelox for CAP .Hyponatremia -Resolved with IVF .AMS -MRI neg for CVA, appreciate neuro assistance -carotid doppler with 60-79% L. ICA stenosis, continue plavix,  await echo -was likely 2/2 hyponatremia and infection/PNA -resolved, pt back to baseline mentation per family - await PT/OT eval .CAD (coronary artery disease) - Chest pain free .h/o Hypertension -controlled on no meds, follow .Hyperlipidemia -resume statin .Hypokalemia -replace k   LOS: 2 days   Ariel Hunt 05/10/2012, 10:12 AM

## 2012-05-10 NOTE — Evaluation (Signed)
Physical Therapy Evaluation Patient Details Name: Ariel Hunt MRN: [id number] DOB: 12-06-1923 Today's Date: 05/10/2012 Time: 7829-5621 PT Time Calculation (min): 14 min  PT Assessment / Plan / Recommendation Clinical Impression  78 yo s/p admitted due to PNA. Pt with abnormal NA levels, which attribuated to pt's AMS. PT's daughter states that she is at baseline at this time regarding her cognitive status. Pt with generalized weakness requiring increased assistance for safety. Will attempt independence with RW or cane in future sessions. Pt will benefit from skilled PT in the acute care setting in order to maximize functional mobility and safety prior to d/c    PT Assessment  Patient needs continued PT services    Follow Up Recommendations  Supervision/Assistance - 24 hour;Home health PT    Barriers to Discharge        lEquipment Recommendations  Other (comment) (TBD for RW)    Recommendations for Other Services     Frequency Min 3X/week    Precautions / Restrictions Precautions Precautions: Fall Restrictions Weight Bearing Restrictions: No         Mobility  Bed Mobility Bed Mobility: Supine to Sit Supine to Sit: 5: Supervision;With rails;HOB elevated Details for Bed Mobility Assistance: VC for sequencing. Slow movement although no physical assist needed Transfers Transfers: Sit to Stand;Stand to Sit Sit to Stand: 4: Min assist;With upper extremity assist;From bed Stand to Sit: 4: Min assist;With upper extremity assist;To chair/3-in-1 Details for Transfer Assistance: unsteady - most likely due to bed rest. VC for hand placement and safety. Assist for stability Ambulation/Gait Ambulation/Gait Assistance: 4: Min assist Ambulation Distance (Feet): 20 Feet Assistive device: 1 person hand held assist Ambulation/Gait Assistance Details: VC for safety. Assist for stability as pt with generalized weakness from decreased activity.  Gait Pattern: Step-to pattern;Decreased  hip/knee flexion - left;Decreased hip/knee flexion - right;Trunk flexed Gait velocity: decreased gait speed    Exercises     PT Diagnosis: Difficulty walking;Generalized weakness  PT Problem List: Decreased strength;Decreased activity tolerance;Decreased balance;Decreased mobility;Decreased knowledge of use of DME;Decreased safety awareness PT Treatment Interventions: DME instruction;Gait training;Stair training;Functional mobility training;Therapeutic activities;Therapeutic exercise;Balance training;Patient/family education   PT Goals Acute Rehab PT Goals PT Goal Formulation: With patient/family Time For Goal Achievement: 05/17/12 Potential to Achieve Goals: Good Pt will go Supine/Side to Sit: with modified independence PT Goal: Supine/Side to Sit - Progress: Goal set today Pt will go Sit to Supine/Side: with modified independence PT Goal: Sit to Supine/Side - Progress: Goal set today Pt will go Sit to Stand: with modified independence PT Goal: Sit to Stand - Progress: Goal set today Pt will go Stand to Sit: with modified independence PT Goal: Stand to Sit - Progress: Goal set today Pt will Transfer Bed to Chair/Chair to Bed: with supervision PT Transfer Goal: Bed to Chair/Chair to Bed - Progress: Goal set today Pt will Ambulate: 51 - 150 feet;with supervision;with least restrictive assistive device PT Goal: Ambulate - Progress: Goal set today Pt will Go Up / Down Stairs: 3-5 stairs;with min assist PT Goal: Up/Down Stairs - Progress: Goal set today  Visit Information  Last PT Received On: 05/10/12 Assistance Needed: +1 PT/OT Co-Evaluation/Treatment: Yes    Subjective Data      Prior Functioning  Home Living Lives With: Alone Available Help at Discharge: Available PRN/intermittently Type of Home: House Home Access: Stairs to enter [employer] of Steps: 4 Entrance Stairs-Rails: None Home Layout: Laundry or work area in basement;Two level Bathroom Shower/Tub:  Tub/shower unit;Walk-in shower;Door [employer]:  Standard Bathroom Accessibility: Yes How Accessible: Accessible via walker Home Adaptive Equipment: Straight cane Prior Function Level of Independence: Independent;Independent with assistive device(s) (used cane PRN) Able to Take Stairs?: Yes Driving: No Vocation: Retired Musician: No difficulties Dominant Hand: Right    Cognition  Overall Cognitive Status: Appears within functional limits for tasks assessed/performed Arousal/Alertness: Awake/alert Orientation Level: Appears intact for tasks assessed Behavior During Session: [facility] for tasks performed    Extremity/Trunk Assessment Right Upper Extremity Assessment RUE ROM/Strength/Tone: [facility] for tasks assessed Left Upper Extremity Assessment LUE ROM/Strength/Tone: WFL for tasks assessed Right Lower Extremity Assessment RLE ROM/Strength/Tone: Within functional levels RLE Sensation: WFL - Light Touch Left Lower Extremity Assessment LLE ROM/Strength/Tone: Within functional levels LLE Sensation: WFL - Light Touch Trunk Assessment Trunk Assessment: Normal   Balance    End of Session PT - End of Session Equipment Utilized During Treatment: Gait belt Activity Tolerance: Patient limited by fatigue Patient left: in chair;with call bell/phone within reach;with family/visitor present Nurse Communication: Mobility status   Milana Kidney 05/10/2012, 1:06 PM  05/10/2012 Milana Kidney DPT PAGER: [phone] OFFICE: [phone]

## 2012-05-10 NOTE — Evaluation (Signed)
Clinical/Bedside Swallow Evaluation Patient Details  Name: Ariel Hunt MRN: [id number] Date of Birth: Sep 26, 1923  Today's Date: 05/10/2012 Time: 0930-1000 SLP Time Calculation (min): 30 min  Past Medical History:  Past Medical History  Diagnosis Date  . Colitis   . Weight loss   . Diarrhea   . CAD (coronary artery disease)   . Hyperlipidemia   . Hypertension   . Asthma   . GERD (gastroesophageal reflux disease)    Past Surgical History:  Past Surgical History  Procedure Date  . Hernia repair   . Coronary angioplasty with stent placement    HPI:  78 y.o. female who was brought to the ED 5/11 by family.    Was noted by family at 2115 to have an acute change with slurred speech, difficulty getting her words out and confusion.  Staff was made aware and code stroke was called. The ct scan of the brain revealed an acute infarction of the Left MCA artery . Patient referred for BSE as she failed RN swallow screen.    Assessment / Plan / Recommendation Clinical Impression  Oropharyngeal swallow WFL for regular consistency and thin liquids with no s/s of aspiration noted during evaluation.  ST to follow in acute care setting for diet tolerance  and completion of Cognitive Linguistic evaluation.     Aspiration Risk  Mild    Diet Recommendation Regular;Thin liquid   Other  Recommendations Intermittent supervison Out of bed for meals and for 30 minutes following Small bites/sips  Follow Up Recommendations  None    Frequency and Duration min 2x/week  2 weeks   SLP Swallow Goals Patient will consume recommended diet without observed clinical signs of aspiration with: Modified independent assistance Patient will utilize recommended strategies during swallow to increase swallowing safety with: Modified independent assistance   Swallow Study Prior Functional Status  No history of dysphagia     General Date of Onset: 05/09/12  78 y.o. female who was brought to the ED  by family.   Has not been doing well for the past few days but seemed to be even worse today.  Was being evaluated in ED.  Was noted by family at 2115 to have an acute change with slurred speech, difficulty getting her words out and confusion.  Staff was made aware and code stroke was called. Type of Study: Bedside swallow evaluation Previous Swallow Assessment: N/A Diet Prior to this Study: NPO Temperature Spikes Noted: No Respiratory Status: Supplemental O2 delivered via (comment) History of Intubation: No Behavior/Cognition: Alert;Cooperative;Pleasant mood Oral Cavity - Dentition: Adequate natural dentition Vision: Functional for self-feeding Patient Positioning: Upright in bed Volitional Cough: Strong Volitional Swallow: Able to elicit    Oral/Motor/Sensory Function Overall Oral Motor/Sensory Function: Appears within functional limits for tasks assessed   Ice Chips Ice chips: Within functional limits   Thin Liquid Thin Liquid: Within functional limits    Nectar Thick Nectar Thick Liquid: Within functional limits   Honey Thick Honey Thick Liquid: Not tested   Puree Puree: Within functional limits   Solid Solid: Within functional limits Presentation: Self [name] MS, CCC-SLP [phone] [facility] 05/10/2012,4:20 PM

## 2012-05-10 NOTE — Progress Notes (Signed)
Patient ID: Ariel Hunt, female   DOB: [birth year], 78 y.o.   MRN: [id number] Stroke Team Progress Note  HISTORY 78y/o woman who was brought to the ED 5/10 by family. She had not been doing well for the past few days but seemed to be even worse on the day of admission. She was noted by family at 2115 on 5/10 to have an acute change with slurred speech, difficulty getting her words out and confusion. Staff was made aware and code stroke was called. Admission lab work showed a sodium of 118. PH on blood gas was 7.5. Patient diagnosed with pneumonia as well. Head CT performed as part of the work up shows an area of hypodensity in the left frontal lobe. As this was of uncertain age and unlikely to be the cause of her AMS, tPA was not given.  SUBJECTIVE Patient's daughter is at bedside. Her sodium has normalized. Patient is much more alert and interactive today. She was able to tell me about her home and family and answered all questions appropriately. MRI brain from yesterday demonstrates no acute infarct. She was unable to pass a swallow eval yesterday; however, her mental status was not optimum.  OBJECTIVE Most recent Vital Signs: Temp: 98.6 F (37 C) (05/12 0600) BP: 133/72 mmHg (05/12 0600) Pulse Rate: 72  (05/12 0600) Respiratory Rate: 18 O2 Saturation: 98%  CBG (last 3)   Basename 05/08/12 2120  GLUCAP 127*   Intake/Output from previous day: 05/11 0701 - 05/12 0700 In: -  Out: 3900 [Urine:3900]  IV Fluid Intake:      . sodium chloride    . sodium chloride 75 mL/hr at 05/09/12 0239   Medications     . albuterol  2.5 mg Nebulization TID  . aspirin  300 mg Rectal Once  . clopidogrel  75 mg Oral Q breakfast  . enoxaparin  40 mg Subcutaneous Q24H  . piperacillin-tazobactam (ZOSYN)  IV  3.375 g Intravenous Once  . piperacillin-tazobactam (ZOSYN)  IV  3.375 g Intravenous Q8H  . potassium chloride  10 mEq Intravenous Q1 Hr x 3  . vancomycin  750 mg Intravenous Q24H  . DISCONTD:  albuterol  2.5 mg Nebulization Q6H  . DISCONTD: aspirin  300 mg Rectal Daily  . DISCONTD: aspirin  325 mg Oral Daily  PRN albuterol, HYDROmorphone (DILAUDID) injection, senna-docusate  Diet:  NPO  Activity:  Bathroom privileges; Up with assistance DVT Prophylaxis:  Lovenox  Studies: CBC    Component Value Date/Time   WBC 7.6 05/08/2012 1948   RBC 3.57* 05/08/2012 1948   HGB 11.0* 05/08/2012 1948   HCT 31.9* 05/08/2012 1948   PLT 404* 05/08/2012 1948   MCV 89.4 05/08/2012 1948   MCH 30.8 05/08/2012 1948   MCHC 34.5 05/08/2012 1948   RDW 13.0 05/08/2012 1948   LYMPHSABS 1.2 05/08/2012 1948   MONOABS 0.9 05/08/2012 1948   EOSABS 0.4 05/08/2012 1948   BASOSABS 0.0 05/08/2012 1948   CMP    Component Value Date/Time   NA 136 05/10/2012 0500   K 3.4* 05/10/2012 0500   CL 104 05/10/2012 0500   CO2 21 05/10/2012 0500   GLUCOSE 77 05/10/2012 0500   BUN 6 05/10/2012 0500   CREATININE 0.60 05/10/2012 0500   CALCIUM 7.0* 05/10/2012 0500   PROT 5.9* 05/08/2012 1948   ALBUMIN 3.2* 05/08/2012 1948   AST 17 05/08/2012 1948   ALT 12 05/08/2012 1948   ALKPHOS 33* 05/08/2012 1948   BILITOT 0.3 05/08/2012 1948  GFRNONAA 80* 05/10/2012 0500   GFRAA >90 05/10/2012 0500   COAGS Lab Results  Component Value Date   INR 1.03 05/08/2012   Lipid Panel No results found for this basename: chol,  trig,  hdl,  cholhdl,  vldl,  ldlcalc   HgbA1C  No results found for this basename: HGBA1C   Urine Drug Screen  No results found for this basename: labopia,  cocainscrnur,  labbenz,  amphetmu,  thcu,  labbarb    Alcohol Level No results found for this basename: eth     Results for orders placed during the hospital encounter of 05/08/12 (from the past 24 hour(s))  NA AND K (SODIUM & POTASSIUM), RAND UR     Status: Normal   Collection Time   05/09/12 10:00 AM      Component Value Range   Sodium, Ur 14     Potassium Urine Timed 10    BASIC METABOLIC PANEL     Status: Abnormal   Collection Time   05/09/12  7:59 PM       Component Value Range   Sodium 133 (*) 135 - 145 (mEq/L)   Potassium 2.9 (*) 3.5 - 5.1 (mEq/L)   Chloride 102  96 - 112 (mEq/L)   CO2 22  19 - 32 (mEq/L)   Glucose, Bld 88  70 - 99 (mg/dL)   BUN 6  6 - 23 (mg/dL)   Creatinine, Ser 1.61  0.50 - 1.10 (mg/dL)   Calcium 7.1 (*) 8.4 - 10.5 (mg/dL)   GFR calc non Af Amer 79 (*) >90 (mL/min)   GFR calc Af Amer >90  >90 (mL/min)  BASIC METABOLIC PANEL     Status: Abnormal   Collection Time   05/10/12  5:00 AM      Component Value Range   Sodium 136  135 - 145 (mEq/L)   Potassium 3.4 (*) 3.5 - 5.1 (mEq/L)   Chloride 104  96 - 112 (mEq/L)   CO2 21  19 - 32 (mEq/L)   Glucose, Bld 77  70 - 99 (mg/dL)   BUN 6  6 - 23 (mg/dL)   Creatinine, Ser 0.96  0.50 - 1.10 (mg/dL)   Calcium 7.0 (*) 8.4 - 10.5 (mg/dL)   GFR calc non Af Amer 80 (*) >90 (mL/min)   GFR calc Af Amer >90  >90 (mL/min)  MAGNESIUM     Status: Normal   Collection Time   05/10/12  5:00 AM      Component Value Range   Magnesium 2.0  1.5 - 2.5 (mg/dL)    Dg Chest 2 View  0/45/4098  *RADIOLOGY REPORT*  Clinical Data: Stroke  CHEST - 2 VIEW  Comparison: 05/08/2012 and 05/06/2012.  Findings: The cardiomediastinal silhouette is stable.  Mild levoscoliosis again noted.  No pulmonary edema.  Small left pleural effusion with left basilar atelectasis or infiltrate again noted. Stable compression deformity mid thoracic spine.  IMPRESSION: Small left pleural effusion with left basilar atelectasis or infiltrate.  No pulmonary edema.  Original Report Authenticated By: Natasha Mead, M.D.   Ct Head Wo Contrast  05/08/2012  *RADIOLOGY REPORT*  Clinical Data: The altered mental status.  Weakness.  Shortness of breath.  Fatigue.  CT HEAD WITHOUT CONTRAST  Technique:  Contiguous axial images were obtained from the base of the skull through the vertex without contrast.  Comparison: None.  Findings: Diffuse bilateral cerebral atrophy.  Mild ventricular dilatation consistent with central atrophy.  There is  a vague low attenuation change  in the left frontal temporal region which appears to spare the cortex.  There is suggestion of mild sulci effacement.  Early changes of acute infarct are suggested.  Diffuse low attenuation change throughout the deep white matter consistent with underlying small vessel ischemic changes.  No evidence of acute intracranial hemorrhage.  No effacement the basilar cisterns. No abnormal extra-axial fluid collections.  No depressed skull fractures.  Visualized paranasal sinuses and mastoid air cells are not opacified.  IMPRESSION: Low attenuation change in the left frontal temporal region with mild sulci effacement suggests early changes of acute infarct in the distribution of the left middle cerebral artery.  Chronic atrophy and small vessel ischemic changes.  No evidence of acute intracranial hemorrhage.  Original Report Authenticated By: Marlon Pel, M.D.   Mr Brain Wo Contrast  05/09/2012  *RADIOLOGY REPORT*  Clinical Data:  Stroke.  Increased confusion.  Disoriented. Abnormal CT scan.  MRI HEAD WITHOUT CONTRAST MRA HEAD WITHOUT CONTRAST  Technique:  Multiplanar, multiecho pulse sequences of the brain and surrounding structures were obtained without intravenous contrast. Angiographic images of the head were obtained using MRA technique without contrast.  Comparison:  CT head without contrast 05/08/2012.  MRI HEAD  Findings:  The diffusion weighted images demonstrate no evidence for acute or subacute infarction.  There was significant motion on the prior head CT and the area in question is likely due to beam hardening artifact associated with the motion.  Atrophy and extensive white matter disease is present bilaterally. The ventricles are proportionate to the degree of atrophy.  No significant extra-axial fluid collection is present.  Flow is present in the major intracranial arteries.  The patient is status post right lens extraction.  There is scattered opacification of  anterior ethmoid air cells.  A polyp or mucous retention cyst is present in the medial right maxillary sinus. Fluid is present in the mastoid air cells bilaterally, worse on the left.  IMPRESSION:  1.  No evidence for acute infarction.  Please see the above discussion. 2.  Atrophy and extensive white matter disease.  This likely reflects the sequelae of chronic microvascular ischemia. 3.  Bilateral mastoid effusions.  No obstructing nasopharyngeal lesion is evident.  MRA HEAD  Findings: The internal carotid arteries are within normal limits from high cervical segments through the ICA termini.  There is minimal irregularity within the A1 and M1 segments bilaterally.  No significant stenosis is present.  The anterior communicating artery is patent.  The study is mildly degraded by patient motion.  There is some attenuation of distal MCA branch vessels, worse on the left.  The vertebral arteries are codominant.  The PICA origins are visualized and within normal limits.  The right superior cerebellar artery is duplicated, a normal variant.  Both posterior cerebral arteries originate from basilar tip.  There is some attenuation of distal PCA branch vessels.  IMPRESSION:  1.  Mild small vessel disease. 2.  No significant proximal stenosis, aneurysm, or branch vessel occlusion.  Original Report Authenticated By: Jamesetta Orleans. MATTERN, M.D.   Dg Chest Port 1 View  05/08/2012  *RADIOLOGY REPORT*  Clinical Data: Cough and shortness of breath.  PORTABLE CHEST - 1 VIEW  Comparison: PA and lateral chest 05/06/2012.  Findings: Small left effusion and basilar airspace disease have increased.  There is some subsegmental atelectasis in the right base.  No pneumothorax.  Heart size upper normal.  Lungs appear emphysematous.  IMPRESSION: Increased left effusion and basilar airspace disease compatible with pneumonia.  Original Report Authenticated By: Bernadene Bell. Maricela Curet, M.D.   Mr Maxine Glenn Head/brain Wo Cm  05/09/2012  *RADIOLOGY  REPORT*  Clinical Data:  Stroke.  Increased confusion.  Disoriented. Abnormal CT scan.  MRI HEAD WITHOUT CONTRAST MRA HEAD WITHOUT CONTRAST  Technique:  Multiplanar, multiecho pulse sequences of the brain and surrounding structures were obtained without intravenous contrast. Angiographic images of the head were obtained using MRA technique without contrast.  Comparison:  CT head without contrast 05/08/2012.  MRI HEAD  Findings:  The diffusion weighted images demonstrate no evidence for acute or subacute infarction.  There was significant motion on the prior head CT and the area in question is likely due to beam hardening artifact associated with the motion.  Atrophy and extensive white matter disease is present bilaterally. The ventricles are proportionate to the degree of atrophy.  No significant extra-axial fluid collection is present.  Flow is present in the major intracranial arteries.  The patient is status post right lens extraction.  There is scattered opacification of anterior ethmoid air cells.  A polyp or mucous retention cyst is present in the medial right maxillary sinus. Fluid is present in the mastoid air cells bilaterally, worse on the left.  IMPRESSION:  1.  No evidence for acute infarction.  Please see the above discussion. 2.  Atrophy and extensive white matter disease.  This likely reflects the sequelae of chronic microvascular ischemia. 3.  Bilateral mastoid effusions.  No obstructing nasopharyngeal lesion is evident.  MRA HEAD  Findings: The internal carotid arteries are within normal limits from high cervical segments through the ICA termini.  There is minimal irregularity within the A1 and M1 segments bilaterally.  No significant stenosis is present.  The anterior communicating artery is patent.  The study is mildly degraded by patient motion.  There is some attenuation of distal MCA branch vessels, worse on the left.  The vertebral arteries are codominant.  The PICA origins are visualized and  within normal limits.  The right superior cerebellar artery is duplicated, a normal variant.  Both posterior cerebral arteries originate from basilar tip.  There is some attenuation of distal PCA branch vessels.  IMPRESSION:  1.  Mild small vessel disease. 2.  No significant proximal stenosis, aneurysm, or branch vessel occlusion.  Original Report Authenticated By: Jamesetta Orleans. MATTERN, M.D.   2D Echocardiogram  Pending.  Carotid Doppler/TCD  No R ICA stenosis. L ICA with 60-79%. Intact verts with forward flow.  Physical Exam   GENERAL:   Well nourished, well hydrated, no acute distress.   CARDIOVASCULAR:   Regular rate and rhythm, no thrills or palpable murmurs, S1, S2, no murmur, no rubs or gallops.      Carotid arteries: No carotid bruits.   RESPIRATORY:  Upper airway noise and peripheral wheezing on left.  EXTREMITIES:  No rashes or lesions     No peripheral edema, cyanosis, or clubbing   MENTAL STATUS EXAM:    Orientation:  Alert and oriented x3. Follows all commands.     Language:  Speech is clear and language is fluent.  CRANIAL NERVES:     CN 2 (Optic):  VFF to confrontation.     CN 3,4,6 (EOM):  Pupils equal and reactive to light and near full eye movement without nystagmus.      CN 5 (Trigeminal):  Facial sensation is normal, no weakness of masticatory muscles.      CN 7 (Facial):  No facial weakness or asymmetry.  CN 8 (Auditory):  Auditory acuity grossly normal.      CN 12 (Hypoglossal):  The tongue is midline. No atrophy or fasciculations.   MOTOR:    Full and symmetric strength throughout.  REFLEXES:     Triceps:                 (R): 2+  (L): 2+      Biceps:                  (R): 2+  (L): 2+      Brachioradialis:     (R): 2+  (L): 2+      Patellar:                 (R): 1+  (L): 1+      Achilles:                 (R): absent  (L): absent      Hoffman:    (R): absent  (L): absent      Babinski:    (R): absent  (L): absent   COORDINATION:     Intact to FNF, HTS,  and RAMs.  SENSATION:     Full and intact sensation throughout.   GAIT:     Deferred.  ASSESSMENT Ms. APOLONIA ELLWOOD is a 78 y.o. female with a left frontal infarct of unknown age on CT, secondary to small vessel disease. On aspirin 325 mg orally every day for secondary stroke prevention.  Stroke risk factors:  hypertension  Hospital day # 2  TREATMENT/PLAN -Continue Plavix 75mg  daily. -No acute infarct on MRI. TTE pending. Carotid ultrasounds with moderate stenosis in the L ICA that will need outpatient follow up. -Will get repeat swallow eval today, as I suspect she will be able to pass now that her mental status is improved. -Will get therapy evals today. -Will continue to follow until stroke workup is completed.  Kipp Laurence, MD [facility] Pager: [phone] 05/10/2012 8:59 AM

## 2012-05-10 NOTE — Progress Notes (Signed)
Occupational Therapy Evaluation Patient Details Name: Ariel Hunt MRN: [id number] DOB: [birth year] Today's Date: 05/10/2012 Time: 4098-1191 OT Time Calculation (min): 20 min  OT Assessment / Plan / Recommendation Clinical Impression  78 yo s/p admitted due to PNA. Pt with abnormal NA levels, which attribuated to pt's AMS. PT's daughter states that she is at baseline at this time regarding her cognitive status.. PT will benfit from skilled OT services, secondary to deficits below,  to max indep and safety with ADL and functinal mobility for ADL to return to PLOF. Rec 24/7 S immediately after D/C, then intermittently as pt becomes stronger.. Discussed this with pt/daughter.     OT Assessment  Patient needs continued OT Services    Follow Up Recommendations  Home health OT    Barriers to Discharge None    Equipment Recommendations  3 in 1 bedside comode    Recommendations for Other Services    Frequency  Min 2X/week    Precautions / Restrictions Precautions Precautions: Fall Restrictions Weight Bearing Restrictions: No   Pertinent Vitals/Pain No pain    ADL  Eating/Feeding: Simulated;Independent Where Assessed - Eating/Feeding: Chair Grooming: Simulated;Set up Where Assessed - Grooming: Supported sitting Upper Body Bathing: Simulated;Set up Where Assessed - Upper Body Bathing: Sitting, chair;Supported Lower Body Bathing: Simulated;Set up Where Assessed - Lower Body Bathing: Sitting, chair;Sit to stand from chair;Unsupported Upper Body Dressing: Performed;Set up Where Assessed - Upper Body Dressing: Sitting, bed;Unsupported Lower Body Dressing: Simulated;Set up;Supervision/safety Where Assessed - Lower Body Dressing: Sit to stand from bed;Unsupported Toilet Transfer: Simulated;Minimal assistance;Other (comment) (bed - recliner) Toilet Transfer Method: Ambulating Toileting - Clothing Manipulation: Simulated;Supervision/safety Where Assessed - Toileting Clothing  Manipulation: Standing Toileting - Hygiene: Simulated;Supervision/safety Where Assessed - Toileting Hygiene: Sit to stand from 3-in-1 or toilet Tub/Shower Transfer: Not assessed Ambulation Related to ADLs: Min A for safety ADL Comments: Overall set up/supervision    OT Diagnosis: Generalized weakness  OT Problem List: Decreased activity tolerance OT Treatment Interventions: Self-care/ADL training;Therapeutic exercise;DME and/or AE instruction;Therapeutic activities;Patient/family education   OT Goals Acute Rehab OT Goals OT Goal Formulation: With patient Time For Goal Achievement: 05/17/12 Potential to Achieve Goals: Good ADL Goals Pt Will Perform Grooming: with modified independence;Standing at sink ADL Goal: Grooming - Progress: Goal set today Pt Will Perform Upper Body Bathing: with modified independence;Standing at sink ADL Goal: Upper Body Bathing - Progress: Goal set today Pt Will Perform Lower Body Bathing: with modified independence;Sit to stand from chair;Unsupported ADL Goal: Lower Body Bathing - Progress: Goal set today Pt Will Perform Upper Body Dressing: with modified independence;Sitting, chair ADL Goal: Upper Body Dressing - Progress: Goal set today Pt Will Perform Lower Body Dressing: with modified independence;Sit to stand from chair;Unsupported ADL Goal: Lower Body Dressing - Progress: Goal set today Pt Will Transfer to Toilet: with modified independence;with DME ADL Goal: Toilet Transfer - Progress: Goal set today Pt Will Perform Toileting - Clothing Manipulation: Independently;Standing ADL Goal: Toileting - Clothing Manipulation - Progress: Goal set today Pt Will Perform Toileting - Hygiene: Independently;Sit to stand from 3-in-1/toilet ADL Goal: Toileting - Hygiene - Progress: Goal set today Pt Will Perform Tub/Shower Transfer: with supervision;with DME;with cueing (comment type and amount) ADL Goal: Tub/Shower Transfer - Progress: Goal set today  Visit  Information  Last OT Received On: 05/10/12 PT/OT Co-Evaluation/Treatment: Yes    Subjective Data   I feel much better,.   Prior Functioning  Home Living Lives With: Alone Available Help at Discharge: Available PRN/intermittently Type of Home: House  Home Access: Stairs to enter Entrance Stairs-Number of Steps: 4 Entrance Stairs-Rails: None Home Layout: Laundry or work area in basement;Two level Bathroom Shower/Tub: Tub/shower unit;Walk-in shower;Door [employer] Toilet: Standard Bathroom Accessibility: Yes How Accessible: Accessible via walker Home Adaptive Equipment: Straight cane Prior Function Level of Independence: Independent;Independent with assistive device(s) (used cane PRN) Able to Take Stairs?: Yes Driving: No Vocation: Retired Musician: No difficulties Dominant Hand: Right    Cognition  Overall Cognitive Status: Appears within functional limits for tasks assessed/performed Arousal/Alertness: Awake/alert Orientation Level: Appears intact for tasks assessed Behavior During Session: [facility] for tasks performed    Extremity/Trunk Assessment Right Upper Extremity Assessment RUE ROM/Strength/Tone: [facility] for tasks assessed Left Upper Extremity Assessment LUE ROM/Strength/Tone: WFL for tasks assessed Trunk Assessment Trunk Assessment: Normal   Mobility Bed Mobility Bed Mobility: Supine to Sit Supine to Sit: 5: Supervision;With rails;HOB elevated Transfers Transfers: Sit to Stand;Stand to Sit Sit to Stand: 4: Min assist;With upper extremity assist;From bed Stand to Sit: 4: Min assist;With upper extremity assist;To chair/3-in-1 Details for Transfer Assistance: unsteady - most likely due to bed rest   Exercise    Balance  stand by assist  End of Session OT - End of Session Equipment Utilized During Treatment: Gait belt Activity Tolerance: Patient tolerated treatment well Patient left: with call bell/phone within reach;in chair;with family/visitor  present Nurse Communication: Mobility status   Shakedra Beam,HILLARY 05/10/2012, 10:55 AM Luisa Dago, OTR/L  [phone] 05/10/2012

## 2012-05-11 DIAGNOSIS — J159 Unspecified bacterial pneumonia: Secondary | ICD-10-CM

## 2012-05-11 DIAGNOSIS — G9341 Metabolic encephalopathy: Secondary | ICD-10-CM

## 2012-05-11 DIAGNOSIS — E871 Hypo-osmolality and hyponatremia: Secondary | ICD-10-CM

## 2012-05-11 DIAGNOSIS — I6340 Cerebral infarction due to embolism of unspecified cerebral artery: Secondary | ICD-10-CM

## 2012-05-11 DIAGNOSIS — R4789 Other speech disturbances: Secondary | ICD-10-CM

## 2012-05-11 DIAGNOSIS — I6330 Cerebral infarction due to thrombosis of unspecified cerebral artery: Secondary | ICD-10-CM

## 2012-05-11 LAB — BASIC METABOLIC PANEL
CO2: 21 mEq/L (ref 19–32)
Chloride: 108 mEq/L (ref 96–112)
Creatinine, Ser: 0.54 mg/dL (ref 0.50–1.10)
GFR calc Af Amer: >90 mL/min (ref >90)
Potassium: 3.6 mEq/L (ref 3.5–5.1)

## 2012-05-11 MED ORDER — MOXIFLOXACIN HCL 400 MG PO TABS: 400.0000 mg | ORAL_TABLET | Freq: Every day | ORAL | Status: DC

## 2012-05-11 MED ORDER — ASPIRIN 325 MG PO TABS: 325.0000 mg | ORAL_TABLET | Freq: Every day | ORAL | Status: DC

## 2012-05-11 MED ORDER — ASPIRIN 325 MG PO TABS: 325.0000 mg | ORAL_TABLET | Freq: Every day | ORAL | Status: AC

## 2012-05-11 NOTE — Progress Notes (Signed)
Physical Therapy Treatment Patient Details Name: Ariel Hunt MRN: [id number] DOB: 03-31-1923 Today's Date: 05/11/2012 Time: 4098-1191 PT Time Calculation (min): 24 min  PT Assessment / Plan / Recommendation Comments on Treatment Session  Patient progressing well. MD in during session and ruled out CVA stating confusion was more than likely from acute dehydration. Patient lives alone but will have family staying with her at discharge. Recommend patient use RW at discharge.     Follow Up Recommendations  Supervision/Assistance - 24 hour    Barriers to Discharge        Equipment Recommendations  Rolling walker with 5" wheels    Recommendations for Other Services    Frequency Min 3X/week   Plan Discharge plan remains appropriate    Precautions / Restrictions Precautions Precautions: Fall Restrictions Weight Bearing Restrictions: No   Pertinent Vitals/Pain Denied pain    Mobility  Bed Mobility Supine to Sit: 6: Modified independent (Device/Increase time) Transfers Sit to Stand: 4: Min guard;From bed;From toilet;Without upper extremity assist Stand to Sit: 4: Min guard;To toilet;To chair/3-in-1;With armrests Details for Transfer Assistance: Cues for safe position and for safe hand placement. Patient has tendency to pull up on RW Ambulation/Gait Ambulation/Gait Assistance: 4: Min assist Ambulation Distance (Feet): 80 Feet Assistive device: Rolling walker Ambulation/Gait Assistance Details: Ambulated patient in room due to episodes of diarrhea. A for RW safety. Cues for utilization of RW Gait Pattern: Step-through pattern    Exercises     PT Diagnosis:    PT Problem List:   PT Treatment Interventions:     PT Goals Acute Rehab PT Goals PT Goal: Supine/Side to Sit - Progress: Met PT Goal: Sit to Stand - Progress: Progressing toward goal PT Goal: Stand to Sit - Progress: Progressing toward goal PT Transfer Goal: Bed to Chair/Chair to Bed - Progress: Progressing toward  goal PT Goal: Ambulate - Progress: Progressing toward goal  Visit Information  Last PT Received On: 05/11/12    Subjective Data  Subjective: "I am ready to go home, I missed my hair appointment and I look crazy"   Cognition  Overall Cognitive Status: Impaired Area of Impairment: Safety/judgement Arousal/Alertness: Awake/alert Orientation Level: Appears intact for tasks assessed Behavior During Session: [facility] for tasks performed Safety/Judgement - Other Comments: Patient require cues for maximum safety.     Balance     End of Session PT - End of Session Equipment Utilized During Treatment: Gait belt Activity Tolerance: Patient tolerated treatment well Patient left: in chair;with family/visitor present Nurse Communication: Mobility status    Fredrich Birks 05/11/2012, 11:37 AM 05/11/2012 Fredrich Birks PTA [phone] pager [phone] office

## 2012-05-11 NOTE — ED Provider Notes (Signed)
Patient seen after transfer from [place] [facility] as code stroke.  Patient was being evaluated at [facility] for her generalized weakness and cough. Patient noted to have mental status changes after albuterol treatment. CT scan done at [facility] shows ischemic changes. Dr. Thad Ranger with neurology at bedside for code stroke at [facility]. NIH score 3. Patient with difficulty with word finding ability. Patient had found to have pneumonia, hyponatremia. It is thought that the ischemic change on her CT scan is subacute. She's not a TPA candidate. She is to be admitted to the hospitalist for treatment of her hyponatremia and pneumonia. Dr. Thad Ranger recommends MRI in the morning. She will follow along with the patient. Dr. Della Goo with triad will admit.  Olivia Mackie, MD 05/11/12 [phone]

## 2012-05-11 NOTE — Care Management Note (Signed)
    Page 1 of 2   05/11/2012     11:01:21 AM   CARE MANAGEMENT NOTE 05/11/2012  Patient:  Ariel Hunt, Ariel Hunt   Account Number:  [id number]  Date Initiated:  05/11/2012  Documentation initiated by:  [facility]  Subjective/Objective Assessment:   Admitted with pneumonia.     Action/Plan:   PT eval- recommending HHPT   Anticipated DC Date:  05/11/2012   Anticipated DC Plan:  HOME W HOME HEALTH SERVICES      DC Planning Services  CM consult      Choice offered to / List presented to:  C-4 Adult Children   DME arranged  WALKER - ROLLING      DME agency  [facility]     [facility] arranged  HH-1 RN  HH-2 PT  HH-4 NURSE'S AIDE      HH agency  [facility]   Status of service:  Completed, signed off Medicare Important Message given?   (If response is "NO", the following Medicare IM given date fields will be blank) Date Medicare IM given:   Date Additional Medicare IM given:    Discharge Disposition:  HOME W HOME HEALTH SERVICES  Per UR Regulation:  Reviewed for med. necessity/level of care/duration of stay  If discussed at Long Length of Stay Meetings, dates discussed:    Comments:  05/11/12 Spoke with patient and her son Ariel Hunt about d/c plans and HHC. Patient's son and daughter Ariel Hunt will be staying with the patient so that she will have 24hr supervision.  They chose [facility] from the [facility] agencies list for [facility], HHPT and aide. Patient does not have a rolling walker. Contacted Ariel Hunt at [facility] and requested HHRN, PT,aide and a rolling walker. Walker to be delivered to room prior to discharge. Ariel Hunt cell numbers for Ariel Hunt per Ariel Hunt: Ariel Hunt [phone], Ariel Hunt [phone]. Jacquelynn Cree RN, BSN , CCM

## 2012-05-11 NOTE — Progress Notes (Signed)
Stroke Team Progress Note  HISTORY 78y/o woman who was brought to the ED 05/08/2012 by family. She had not been doing well for the past few days but seemed to be even worse on the day of admission. She was noted by family at 2115 on 5/10 to have an acute change with slurred speech, difficulty getting her words out and confusion. Staff was made aware and code stroke was called. Admission lab work showed a sodium of 118. PH on blood gas was 7.5. Patient diagnosed with pneumonia as well. Head CT performed as part of the work up shows an area of hypodensity in the left frontal lobe. As this was of uncertain age and unlikely to be the cause of her AMS, tPA was not given. She was admitted for further evaluation and treatment.  SUBJECTIVE Her son is at the bedside.  Overall she feels her condition is gradually improving. Patient lives alone. Family checks on every other day. Family can provide daily followup at time of discharge. They are discussing life alert for her.  OBJECTIVE Most recent Vital Signs: Filed Vitals:   05/10/12 2102 05/10/12 2200 05/11/12 0144 05/11/12 0705  BP:  110/62 131/75 132/71  Pulse:  73 76 78  Temp:  98.1 F (36.7 C) 97.6 F (36.4 C) 98.5 F (36.9 C)  TempSrc:   Oral Oral  Resp:  18 19 18   SpO2: 99% 100% 96% 97%   CBG (last 3)   Basename 05/08/12 2120  GLUCAP 127*   Intake/Output from previous day:   IV Fluid Intake:     . sodium chloride    . sodium chloride 75 mL/hr at 05/11/12 0311   MEDICATIONS    . albuterol  2.5 mg Nebulization TID  . clopidogrel  75 mg Oral Q breakfast  . enoxaparin  40 mg Subcutaneous Q24H  . escitalopram  5 mg Oral Daily  . Fluticasone-Salmeterol  1 puff Inhalation Q12H  . moxifloxacin  400 mg Intravenous Q24H  . potassium chloride  40 mEq Oral Once  . simvastatin  20 mg Oral q1800  . DISCONTD: piperacillin-tazobactam (ZOSYN)  IV  3.375 g Intravenous Once  . DISCONTD: piperacillin-tazobactam (ZOSYN)  IV  3.375 g Intravenous Q8H    . DISCONTD: vancomycin  750 mg Intravenous Q24H   PRN:  albuterol, HYDROcodone-acetaminophen, HYDROmorphone (DILAUDID) injection, senna-docusate, traMADol, DISCONTD: HYDROcodone-acetaminophen  Diet:  Cardiac thin liquids Activity:   Bathroom privileges DVT Prophylaxis:  Lovenox 40 mg sq daily   CLINICALLY SIGNIFICANT STUDIES Basic Metabolic Panel:  Lab 05/11/12 1610 05/10/12 0500  NA 138 136  K 3.6 3.4*  CL 108 104  CO2 21 21  GLUCOSE 81 77  BUN 6 6  CREATININE 0.54 0.60  CALCIUM 7.3* 7.0*  MG -- 2.0  PHOS -- --   Liver Function Tests:  Lab 05/08/12 1948  AST 17  ALT 12  ALKPHOS 33*  BILITOT 0.3  PROT 5.9*  ALBUMIN 3.2*   CBC:  Lab 05/08/12 1948  WBC 7.6  NEUTROABS 5.1  HGB 11.0*  HCT 31.9*  MCV 89.4  PLT 404*   Coagulation:  Lab 05/08/12 2308  LABPROT 13.7  INR 1.03   Lipid Panel    Component Value Date/Time   CHOL 130 05/09/2012 0625   TRIG 78 05/09/2012 0625   HDL 39* 05/09/2012 0625   CHOLHDL 3.3 05/09/2012 0625   VLDL 16 05/09/2012 0625   LDLCALC 75 05/09/2012 0625   HgbA1C  Lab Results  Component Value Date  HGBA1C 5.9* 05/09/2012   CT of the brain  05/08/2012 Low attenuation change in the left frontal temporal region with mild sulci effacement suggests early changes of acute infarct in the distribution of the left middle cerebral artery. Chronic atrophy and small vessel ischemic changes. No evidence of acute intracranial hemorrhage.  MRI of the brain  05/09/2012   1.  No evidence for acute infarction.  Please see the above discussion. 2.  Atrophy and extensive white matter disease.  This likely reflects the sequelae of chronic microvascular ischemia. 3.  Bilateral mastoid effusions.  No obstructing nasopharyngeal lesion is evident.   MRA of the brain  05/09/2012   1.  Mild small vessel disease. 2.  No significant proximal stenosis, aneurysm, or branch vessel occlusion.    2D Echocardiogram  EF 50-55% with no source of embolus.   Carotid Doppler  No R  ICA stenosis. L ICA with 60-79%. Intact verts with forward flow.  CXR  05/09/2012  Small left pleural effusion with left basilar atelectasis or infiltrate.  No pulmonary edema.   EKG  normal sinus rhythm.   Therapy Recommendations PT -HH, OT -HH  Physical Exam    Pleasant elderly lady not in distress.Awake alert. Afebrile. Head is nontraumatic. Neck is supple without bruit. Hearing is reduced bilaterally.. Cardiac exam no murmur or gallop. Lungs are clear to auscultation. Distal pulses are well felt.  Neurological Exam ; Awake alert oriented x 2 with diminished attention, registration and recall.Does not know the President`s name. normal speech and language. Mild right lower face asymmetry. Tongue midline. No drift. Mild diminished fine finger movements on right. Orbits left over right upper extremity. Mild right grip weak.. Normal sensation . Normal coordination.  ;  ASSESSMENT Ariel Hunt is a 78 y.o. female with an old left frontal infarct on CT, MRI negative for acute infarct. Acute mental status change related to hyponatremia and resultant encephalopathy. Old infarct secondary to small vessel disease.  On aspirin 81 mg orally every day prior to admission. Now on clopidogrel 75 mg orally every day for secondary stroke prevention. Patient with resultant confusion that is improving.  -hypertension -incidental left ICA stenniosis 60-79% -community acquired pneumonia -altered mental status -CAD -hyperlipidemia  Hospital day # 3  TREATMENT/PLAN -Change back to aspirin 325 mg orally every day for secondary stroke prevention as no new stroke -agree with home health PT and OT followup -Stroke Service will sign off. Follow up with neuro as required.  Joaquin Music, ANP-BC, GNP-BC [facility] Pager: [phone] 05/11/2012 8:20 AM  Scribe for Dr. Delia Heady, Stroke Center Medical Director. He has personally reviewed chart, pertinent data, examined the patient and  developed the plan of care. Pager:  [phone]

## 2012-05-11 NOTE — Discharge Summary (Signed)
DISCHARGE SUMMARY  VENTURA LEGGITT  MR#: [id number]  DOB:October 07, 1923  Date of Admission: 05/08/2012 Date of Discharge: 05/11/2012  Attending Physician:Ariel Hunt  Patient's Ariel Hunt:Ariel Hunt,Ariel Hessie Diener, MD, MD  Consults:Treatment Team:  Md Stroke, MD  Discharge Diagnoses: Present on Admission:  .Encephalopathy, metabolic .Community acquired pneumonia .Hyponatremia .CAD (coronary artery disease) .Hypertension .Hyperlipidemia   Hospital Course: Ariel Hunt was admitted with confusion,found to have PNA/hyponatremia, sodium 118. There was concern for CVA but MRI brain did not show cva. Ariel Hunt had metabolic encephalopathy from PNA/hyponatremia(?due to dehydration). She has since improved and is close to baseline, with normalization of sodium. She will d/c home with HHS.   Medication List  As of 05/11/2012  9:08 AM   STOP taking these medications         pravastatin 80 MG tablet         TAKE these medications         ADVAIR DISKUS 100-50 MCG/DOSE Aepb   Generic drug: Fluticasone-Salmeterol   Inhale 1 puff into the lungs every 12 (twelve) hours.      aspirin 81 MG tablet   Take 81 mg by mouth daily.      budesonide 3 MG 24 hr capsule   Commonly known as: ENTOCORT EC   Take 2 capsules (6 mg total) by mouth daily.      calcium-vitamin D 250-125 MG-UNIT per tablet   Commonly known as: OSCAL WITH D   Take 1 tablet by mouth 2 (two) times daily.      escitalopram 10 MG tablet   Commonly known as: LEXAPRO   Take 5 mg by mouth daily.      HYDROcodone-acetaminophen 5-500 MG per tablet   Commonly known as: VICODIN   Take 1 tablet by mouth every 6 (six) hours as needed. For pain relief      moxifloxacin 400 MG tablet   Commonly known as: AVELOX   Take 1 tablet (400 mg total) by mouth daily.      multivitamin tablet   Take 1 tablet by mouth daily.      traMADol 50 MG tablet   Commonly known as: ULTRAM   Take 50 mg by mouth every 6 (six) hours as needed. For pain relief             Day of Discharge BP 132/71  Pulse 78  Temp(Src) 98.5 F (36.9 C) (Oral)  Resp 18  SpO2 98%  Physical Exam: Not in distress.  Results for orders placed during the hospital encounter of 05/08/12 (from the past 24 hour(s))  NA AND K (SODIUM & POTASSIUM), RAND UR     Status: Normal   Collection Time   05/10/12 10:53 AM      Component Value Range   Sodium, Ur 108     Potassium Urine Timed 27    OSMOLALITY, URINE     Status: Abnormal   Collection Time   05/10/12 10:54 AM      Component Value Range   Osmolality, Ur 349 (*) 390 - 1090 (mOsm/kg)  BASIC METABOLIC PANEL     Status: Abnormal   Collection Time   05/11/12  7:15 AM      Component Value Range   Sodium 138  135 - 145 (mEq/L)   Potassium 3.6  3.5 - 5.1 (mEq/L)   Chloride 108  96 - 112 (mEq/L)   CO2 21  19 - 32 (mEq/L)   Glucose, Bld 81  70 - 99 (mg/dL)   BUN 6  6 - 23 (mg/dL)   Creatinine, Ser 4.54  0.50 - 1.10 (mg/dL)   Calcium 7.3 (*) 8.4 - 10.5 (mg/dL)   GFR calc non Af Amer 83 (*) >90 (mL/min)   GFR calc Af Amer >90  >90 (mL/min)    Disposition: home today.   Follow-up Appts: Discharge Orders    Future Orders Please Complete By Expires   Diet general      Increase activity slowly           Time spent in discharge (includes decision making & examination of pt): 20 minutes  Signed: Shamiyah Ngu 05/11/2012, 9:08 AM

## 2012-05-11 NOTE — Evaluation (Signed)
Speech Language Pathology Evaluation Patient Details Name: Ariel Hunt MRN: [id number] DOB: Aug 05, 1923 Today's Date: 05/11/2012 Time: 6578-4696 SLP Time Calculation (min): 14 min  Problem List:  Patient Active Problem List  Diagnoses  . COLLAGENOUS COLITIS  . Hypertension  . Encephalopathy, metabolic  . Community acquired pneumonia  . CAD (coronary artery disease)  . Hyperlipidemia   Past Medical History:  Past Medical History  Diagnosis Date  . Colitis   . Weight loss   . Diarrhea   . CAD (coronary artery disease)   . Hyperlipidemia   . Hypertension   . Asthma   . GERD (gastroesophageal reflux disease)    Past Surgical History:  Past Surgical History  Procedure Date  . Hernia repair   . Coronary angioplasty with stent placement     Assessment / Plan / Recommendation Clinical Impression  Demonstrates overall functional cognitive-linguistic abilities that are likely her baseline (short term memory issues, although patient was accurate in this assessment).  No skilled SLP needs.  Agree with PT's recommendation for 24/hr assistance for safety.  No further dysphagia goals as patient is tolerating her current diet without any difficulties.    SLP Assessment  Patient does not need any further Speech Language Pathology Services    Pertinent Vitals/Pain n/a    SLP Evaluation Prior Functioning  Cognitive/Linguistic Baseline: Baseline deficits Baseline deficit details: Son reports gradual short term memory loss over the past few years. Type of Home: House Lives With: Alone Available Help at Discharge: Friend(s);Available 24 hours/day Vocation: Retired   IT consultant  Overall Cognitive Status: Appears within functional limits for tasks assessed Orientation Level: Oriented to person;Oriented to place;Disoriented to time    Comprehension  Auditory Comprehension Overall Auditory Comprehension: Appears within functional limits for tasks assessed    Expression Verbal  Expression Overall Verbal Expression: Appears within functional limits for tasks assessed Written Expression Dominant Hand: Right Written Expression: Not tested   Oral / Motor Oral Motor/Sensory Function Overall Oral Motor/Sensory Function: Appears within functional limits for tasks assessed Motor Speech Overall Motor Speech: Appears within functional limits for tasks assessed     Myra Rude, M.S.,CCC-SLP Pager [phone] 05/11/2012, 10:40 AM

## 2012-05-11 NOTE — Progress Notes (Signed)
Occupational Therapy Treatment Patient Details Name: Ariel Hunt MRN: [id number] DOB: 1923/07/20 Today's Date: 05/11/2012 Time: 7829-5621 [id number] first attempt with education) OT Time Calculation (min): 18 min  OT Assessment / Plan / Recommendation Comments on Treatment Session Pt progressing well and at adequate level for d/c home.    Follow Up Recommendations  Home health OT    Barriers to Discharge       Equipment Recommendations  Rolling walker with 5" wheels    Recommendations for Other Services    Frequency Min 2X/week   Plan Discharge plan remains appropriate    Precautions / Restrictions Precautions Precautions: Fall   Pertinent Vitals/Pain none    ADL  Upper Body Dressing: Performed;Supervision/safety Where Assessed - Upper Body Dressing: Sit to stand from chair Lower Body Dressing: Performed;Minimal assistance Where Assessed - Lower Body Dressing: Sit to stand from chair (educated on sitting with dressing due to fall risk) Toilet Transfer: Buyer, retail Method: Surveyor, minerals: Raised toilet seat with arms (or 3-in-1 over toilet) Equipment Used: Rolling walker ADL Comments: Pt and daughter present for dressing for d/c home. Pt required education on sitting for dressing due to fall risk. Pt with LOB x1 during session. Pt holding onto unsafe environmental supports during session (bedside table with wheels to don pants)      OT Goals Acute Rehab OT Goals OT Goal Formulation: With patient Time For Goal Achievement: 05/17/12 Potential to Achieve Goals: Good ADL Goals Pt Will Perform Grooming: with modified independence;Standing at sink ADL Goal: Grooming - Progress: Progressing toward goals Pt Will Perform Upper Body Bathing: with modified independence;Standing at sink ADL Goal: Upper Body Bathing - Progress: Progressing toward goals Pt Will Perform Lower Body Bathing: with modified independence;Sit to  stand from chair;Unsupported ADL Goal: Lower Body Bathing - Progress: Progressing toward goals Pt Will Perform Upper Body Dressing: with modified independence;Sitting, chair ADL Goal: Upper Body Dressing - Progress: Progressing toward goals Pt Will Perform Lower Body Dressing: with modified independence;Sit to stand from chair;Unsupported ADL Goal: Lower Body Dressing - Progress: Met Pt Will Transfer to Toilet: with modified independence;with DME ADL Goal: Toilet Transfer - Progress: Progressing toward goals Pt Will Perform Toileting - Clothing Manipulation: Independently;Standing ADL Goal: Toileting - Clothing Manipulation - Progress: Progressing toward goals Pt Will Perform Toileting - Hygiene: Independently;Sit to stand from 3-in-1/toilet ADL Goal: Toileting - Hygiene - Progress: Goal set today  Visit Information  Last OT Received On: 05/11/12 Assistance Needed: +1    Subjective Data      Prior Functioning  Home Living Lives With: Alone Available Help at Discharge: Friend(s);Available 24 hours/day Type of Home: House Prior Function Vocation: Retired Dominant Hand: Right    Cognition  Overall Cognitive Status: Impaired Area of Impairment: Safety/judgement Arousal/Alertness: Awake/alert Orientation Level: Appears intact for tasks assessed Behavior During Session: [facility] for tasks performed Safety/Judgement: Decreased awareness of need for assistance Safety/Judgement - Other Comments: Patient require cues for maximum safety.     Mobility Bed Mobility Supine to Sit: 6: Modified independent (Device/Increase time) Transfers Transfers: Sit to Stand;Stand to Sit Sit to Stand: 5: Supervision;From chair/3-in-1;With upper extremity assist Stand to Sit: 5: Supervision;With upper extremity assist;To chair/3-in-1 Details for Transfer Assistance: Cues for safe position and for safe hand placement. Patient has tendency to pull up on RW   Exercises    Balance Balance Balance Assessed:  Yes Static Standing Balance Static Standing - Balance Support: During functional activity;Bilateral upper extremity supported  End of Session  OT - End of Session Activity Tolerance: Patient tolerated treatment well Patient left: in chair;with call bell/phone within reach;with family/visitor present (daughter) Nurse Communication: Mobility status   Lucile Shutters 05/11/2012, 1:25 PM Pager: [phone]

## 2012-05-15 LAB — CULTURE, BLOOD (ROUTINE X 2)
Culture  Setup Time: 201305111125
Culture: NO GROWTH

## 2012-08-10 ENCOUNTER — Other Ambulatory Visit: Payer: Self-pay | Admitting: Gastroenterology

## 2012-09-28 ENCOUNTER — Ambulatory Visit: Payer: Medicare Other | Admitting: Gastroenterology

## 2012-10-12 ENCOUNTER — Other Ambulatory Visit: Payer: Self-pay | Admitting: Gastroenterology

## 2012-10-28 ENCOUNTER — Ambulatory Visit (INDEPENDENT_AMBULATORY_CARE_PROVIDER_SITE_OTHER): Payer: Medicare Other | Admitting: Gastroenterology

## 2012-10-28 ENCOUNTER — Encounter: Payer: Self-pay | Admitting: Gastroenterology

## 2012-10-28 ENCOUNTER — Other Ambulatory Visit (INDEPENDENT_AMBULATORY_CARE_PROVIDER_SITE_OTHER): Payer: Medicare Other

## 2012-10-28 VITALS — BP 120/70 | HR 70 | Ht 59.5 in | Wt 91.0 lb

## 2012-10-28 DIAGNOSIS — K52831 Collagenous colitis: Secondary | ICD-10-CM

## 2012-10-28 DIAGNOSIS — L299 Pruritus, unspecified: Secondary | ICD-10-CM

## 2012-10-28 DIAGNOSIS — K5289 Other specified noninfective gastroenteritis and colitis: Secondary | ICD-10-CM

## 2012-10-28 LAB — COMPREHENSIVE METABOLIC PANEL
ALT: 16 U/L (ref 0–35)
AST: 24 U/L (ref 0–37)
Creatinine, Ser: 0.9 mg/dL (ref 0.4–1.2)
Total Bilirubin: 0.4 mg/dL (ref 0.3–1.2)

## 2012-10-28 LAB — CBC WITH DIFFERENTIAL/PLATELET
Basophils Absolute: 0.1 10*3/uL (ref 0.0–0.1)
Eosinophils Absolute: 1.5 10*3/uL — ABNORMAL HIGH (ref 0.0–0.7)
Hemoglobin: 10 g/dL — ABNORMAL LOW (ref 12.0–15.0)
Lymphocytes Relative: 18.3 % (ref 12.0–46.0)
Lymphs Abs: 1.6 10*3/uL (ref 0.7–4.0)
MCHC: 32.4 g/dL (ref 30.0–36.0)
Neutro Abs: 4.9 10*3/uL (ref 1.4–7.7)
Platelets: 329 10*3/uL (ref 150.0–400.0)
RDW: 14.6 % (ref 11.5–14.6)

## 2012-10-28 MED ORDER — BUDESONIDE 3 MG PO CP24: 9.0000 mg | ORAL_CAPSULE | Freq: Every day | ORAL | Status: DC

## 2012-10-28 NOTE — Patient Instructions (Addendum)
Continue taking entocort daily, try 1 pill alternating with 2 pills daily. New script written. I don't think the itching is related to entocort. You will have labs checked today in the basement lab.  Please head down after you check out with the front desk  (cbc, cmet).  Will call son with the result of these. Keep feet elevated when you can.

## 2012-10-28 NOTE — Progress Notes (Signed)
Review of gastrointestinal problems:  1. Collagenous colitis. Presented June 2009 with perfuse diarrhea. Underwent colonoscopy early July 2009 essentially normal examination but random colon biopsies showed collagenous colitis. Responded fairly quickly to budesonide 3 pills a day. Also suffered some more than typical scope trauma during colonoscopy and was admitted with prophylactic antibiotics and observation. She was discharged without problems 48 hours later. She switched to low-dose prednisone do to prohibitive cost of Entocort. May, 2010 she has found that 5 mg a day of prednisone does not hold her symptoms however 10 mg of prednisone a day keeps her diarrhea completely controlled. February, 2011: had tapered down prednisone to 2.5 milligrams every other day and started to flare. Increased prednisone to 15 mg a day with dramatic, complete response. March, 2012 was taking prednisone 10 mg every day, began to taper.  Feb 2013  she was maintaining remission with Entocort one to 2 pills daily. 2. intermittent small bowel obstruction: Likely adhesive, presented July 2012 with small bowel obstruction by plain films and CT scan. Resolved with NG tube decompression, conservative measures.   HPI: This is a   very pleasant 78 year old woman who is here with her son  Had had swelling in lower extremities,   Also itchy rash periodically.  3-4 months of this.  She was given prescription cream by PCP, uses this and it helps but not completely.  She is set to meet with Dr. Yetta Barre, dermatologists.    Was in hospital 4-5 months ago, penumonia then vomiting diarrheal illness.    Bloodwork in past 3 months.  appt to be made with cardiologist.  Bowel have very good. BM every 2-3 days usually but sometimes looser, mushy stools.   Past Medical History  Diagnosis Date  . Colitis   . Weight loss   . Diarrhea   . CAD (coronary artery disease)   . Hyperlipidemia   . Hypertension   . Asthma   . GERD  (gastroesophageal reflux disease)     Past Surgical History  Procedure Date  . Hernia repair     inguinal  . Coronary angioplasty with stent placement   . Bowel adhesions     several    Current Outpatient Prescriptions  Medication Sig Dispense Refill  . aspirin 325 MG tablet Take 1 tablet (325 mg total) by mouth daily.  30 tablet  0  . budesonide (ENTOCORT EC) 3 MG 24 hr capsule TAKE 2 CAPSULES (6 MG TOTAL) BY MOUTH DAILY.  60 capsule  1  . calcium-vitamin D (OSCAL WITH D) 250-125 MG-UNIT per tablet Take 1 tablet by mouth 2 (two) times daily.        . Fluticasone-Salmeterol (ADVAIR DISKUS) 100-50 MCG/DOSE AEPB Inhale 1 puff into the lungs every 12 (twelve) hours.        Marland Kitchen HYDROcodone-acetaminophen (VICODIN) 5-500 MG per tablet Take 1 tablet by mouth every 6 (six) hours as needed. For pain relief      . Multiple Vitamin (MULTIVITAMIN) tablet Take 1 tablet by mouth daily.        Marland Kitchen PRESCRIPTION MEDICATION Losartin , ? Mg, 1/2 twice a day      . traMADol (ULTRAM) 50 MG tablet Take 50 mg by mouth every 6 (six) hours as needed. For pain relief        Allergies as of 10/28/2012  . (No Known Allergies)    Family History  Problem Relation Age of Onset  . Colon cancer Father   . Breast cancer Sister   .  Breast cancer Maternal Aunt     History   Social History  . Marital Status: Widowed    Spouse Name: N/A    Number of Children: 3  . Years of Education: N/A   Occupational History  . Not on file.   Social History Main Topics  . Smoking status: Former Smoker    Quit date: 12/30/1990  . Smokeless tobacco: Never Used  . Alcohol Use: No  . Drug Use: No  . Sexually Active: Not on file   Other Topics Concern  . Not on file   Social History Narrative  . No narrative on file      Physical Exam: BP 120/70  Pulse 70  Ht 4' 11.5" (1.511 m)  Wt 91 lb (41.277 kg)  BMI 18.07 kg/m2 Constitutional: generally well-appearing Psychiatric: alert and oriented x3 Abdomen: soft,  nontender, nondistended, no obvious ascites, no peritoneal signs, normal bowel sounds Bilateral lower extremity, feet swelling    Assessment and plan: 78 y.o. female with pruritus, swelling in her feet, bowels under good control on Entocort 2 pills daily  I don't think the Anticort is causing her itching. It may however be contributing to some of her edema, feet swelling. I recommended she try taking one pill alternating with 2 pills every day. This will expose her to a bit of steroids and may lead to slightly less edema. Frankly she is otherwise quite cachectic which is her usual appearance. I am reluctant to recommend any diuretics for her feet only swelling. She has been having pruritus. Her sclera are not icteric been going to set her up with blood tests including CBC, complete metabolic profile to look for potential hepatic causes of itching.

## 2013-03-12 ENCOUNTER — Encounter (HOSPITAL_BASED_OUTPATIENT_CLINIC_OR_DEPARTMENT_OTHER): Payer: Medicare Other | Attending: General Surgery

## 2013-03-12 DIAGNOSIS — I10 Essential (primary) hypertension: Secondary | ICD-10-CM | POA: Insufficient documentation

## 2013-03-12 DIAGNOSIS — L97809 Non-pressure chronic ulcer of other part of unspecified lower leg with unspecified severity: Secondary | ICD-10-CM | POA: Insufficient documentation

## 2013-03-12 DIAGNOSIS — I872 Venous insufficiency (chronic) (peripheral): Secondary | ICD-10-CM | POA: Insufficient documentation

## 2013-03-13 NOTE — Progress Notes (Signed)
[facility]  NAMEBRINDLEY, MADARANG NO.:  [id number]  MEDICAL RECORD NO.:  [id number]      DATE OF BIRTH:  1923/03/28  PHYSICIAN:  Ardath Sax, M.D.           VISIT DATE:                                  OFFICE VISIT   Ariel Hunt is an 79 year old lady, who comes here for the 1st time, with venous stasis ulcers of both of her legs.  These ulcers are multiple and vary anywhere from 1 cm to 2 cm in diameter.  She has even superficial blisters of edematous skin.  She is on 60 mg of Lasix a day.  She is also on K-Lor, losartan, tramadol, and Diovan.  She has good pulses and the feet are warm, but they obviously are bothered with venous stasis. She has a history of aortic insufficiency and hypertension, but at the present time, those are not giving her much of a problem.  She was examined today and found to have a blood pressure of 123/56, pulse 79, respirations 19.  They could not get a temperature.  She only weighed 95 pounds, but according to the son, she eats very well and does take vitamin pills.  So after evaluating her, I put her as a venous stasis ulcers, which probably were precipitated by some minor trauma to the legs, but I am going to treat her with Unna boots and Santyl, and we will see her back in a week.  DIAGNOSES:  Venous stasis ulcers, bilateral legs and history of hypertension.     Ardath Sax, M.D.     PP/MEDQ  D:  03/12/2013  T:  03/13/2013  Job:  [id number]

## 2013-04-02 ENCOUNTER — Encounter (HOSPITAL_BASED_OUTPATIENT_CLINIC_OR_DEPARTMENT_OTHER): Payer: Medicare Other | Attending: General Surgery

## 2013-04-02 DIAGNOSIS — L97909 Non-pressure chronic ulcer of unspecified part of unspecified lower leg with unspecified severity: Secondary | ICD-10-CM | POA: Insufficient documentation

## 2013-05-07 ENCOUNTER — Encounter (HOSPITAL_BASED_OUTPATIENT_CLINIC_OR_DEPARTMENT_OTHER): Payer: Medicare Other | Attending: General Surgery

## 2013-05-07 DIAGNOSIS — L97909 Non-pressure chronic ulcer of unspecified part of unspecified lower leg with unspecified severity: Secondary | ICD-10-CM | POA: Insufficient documentation

## 2013-05-07 DIAGNOSIS — I87319 Chronic venous hypertension (idiopathic) with ulcer of unspecified lower extremity: Secondary | ICD-10-CM | POA: Insufficient documentation

## 2013-05-10 IMAGING — CR DG ABDOMEN ACUTE W/ 1V CHEST
3 series · 3 of 3 positions shown · non-contrast
Comparison: 06/07/2010.

CLINICAL DATA: Follow-up.  Abdominal pain.  Irritable bowel
syndrome.  Weakness.  Confusion.

ACUTE ABDOMEN SERIES (ABDOMEN 2 VIEW & CHEST 1 VIEW)

[w chest pa]
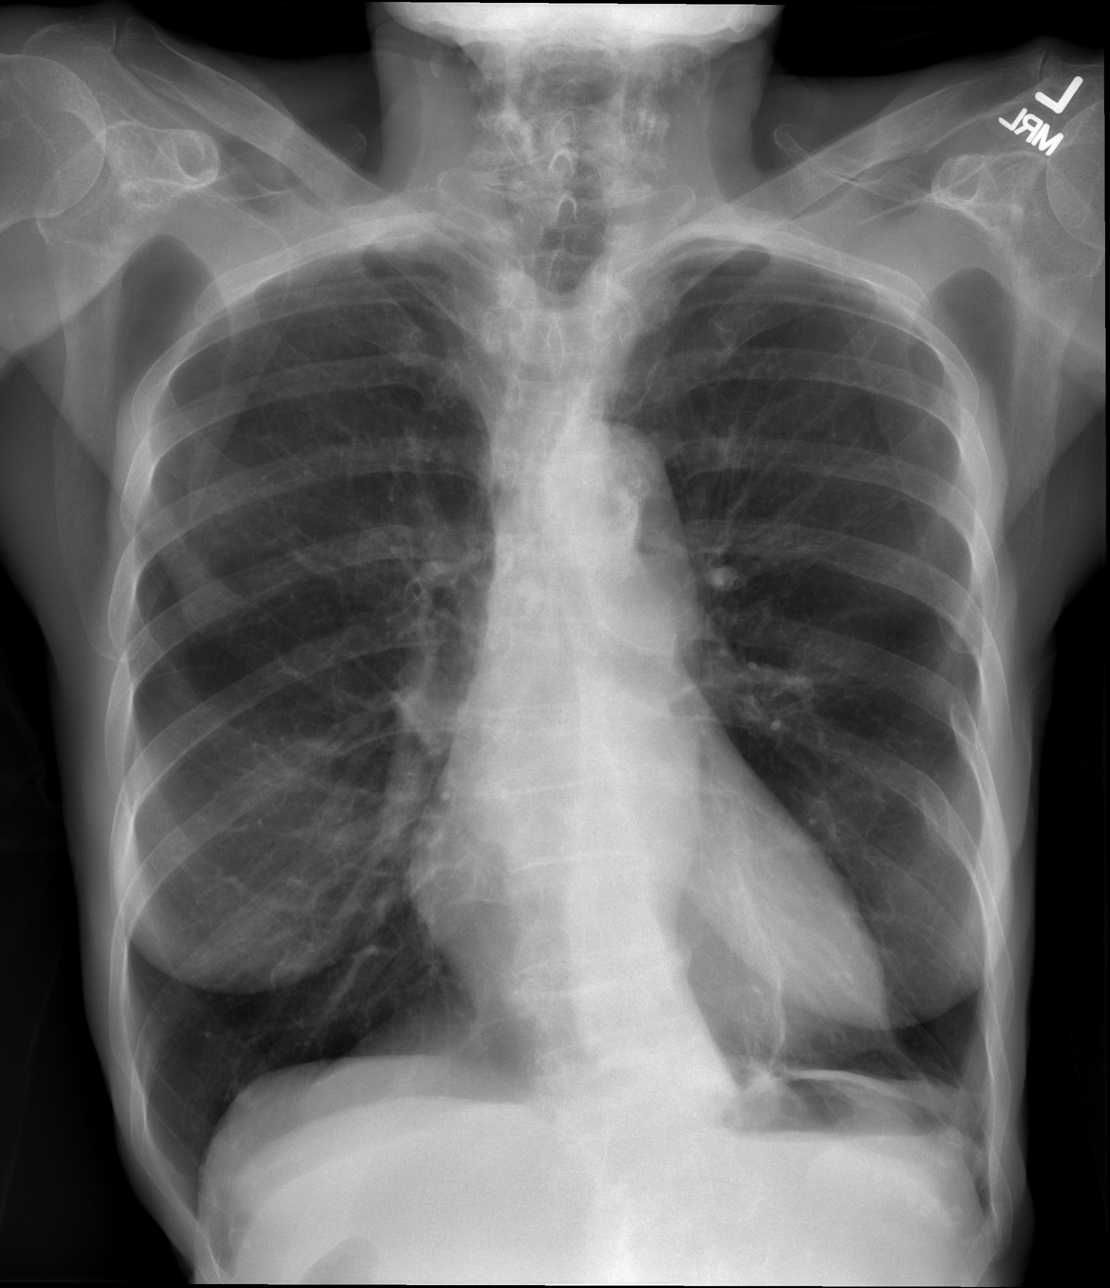

[w abdomen upright *]
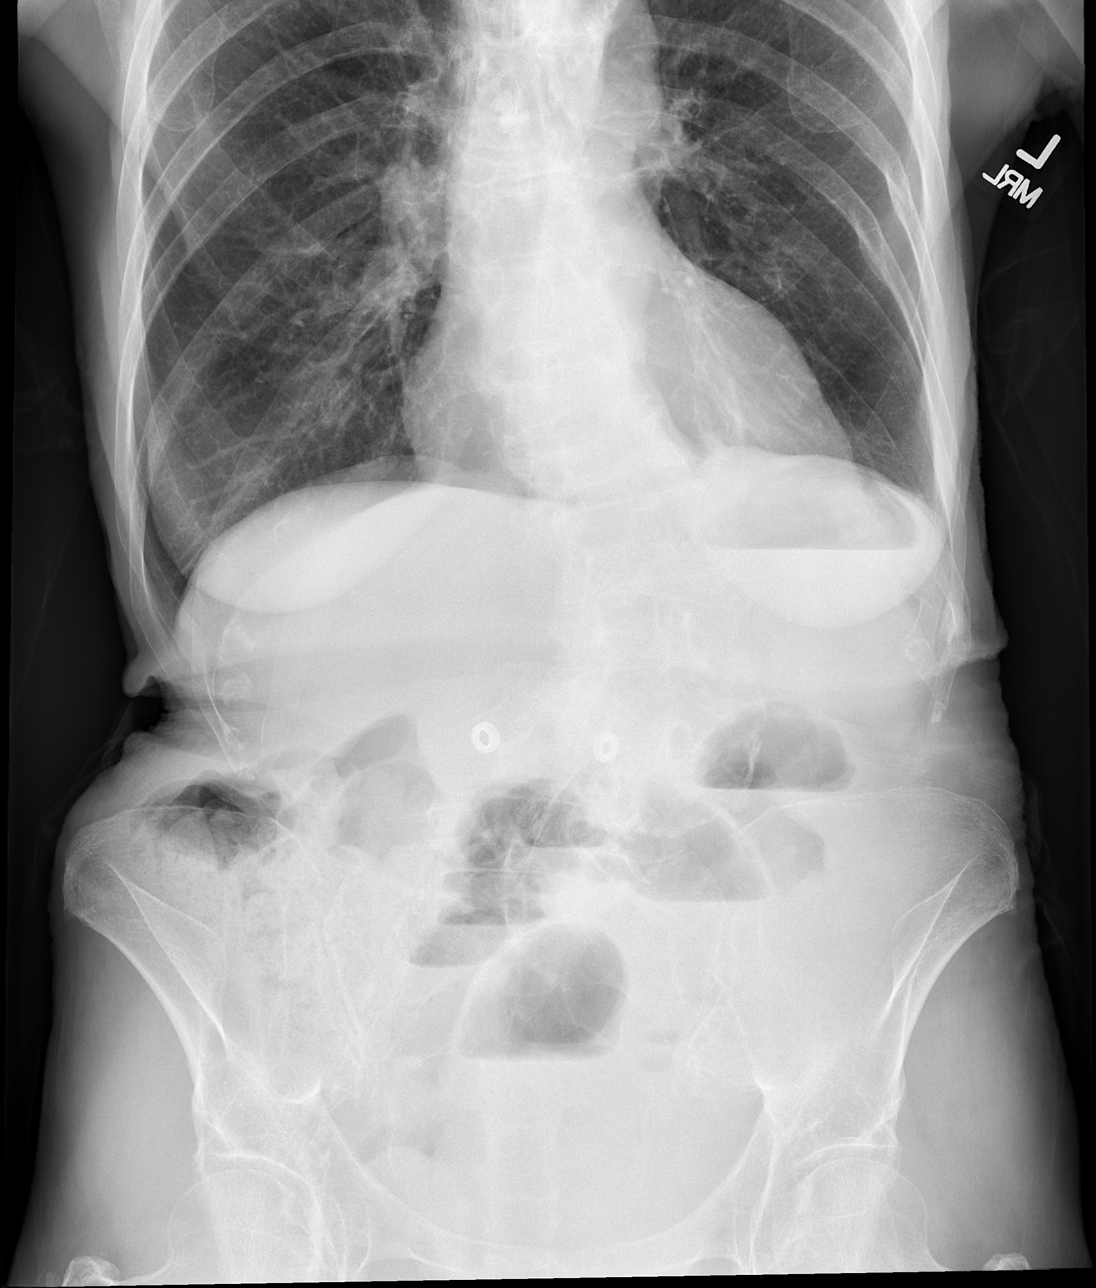

[t abdomen supine]
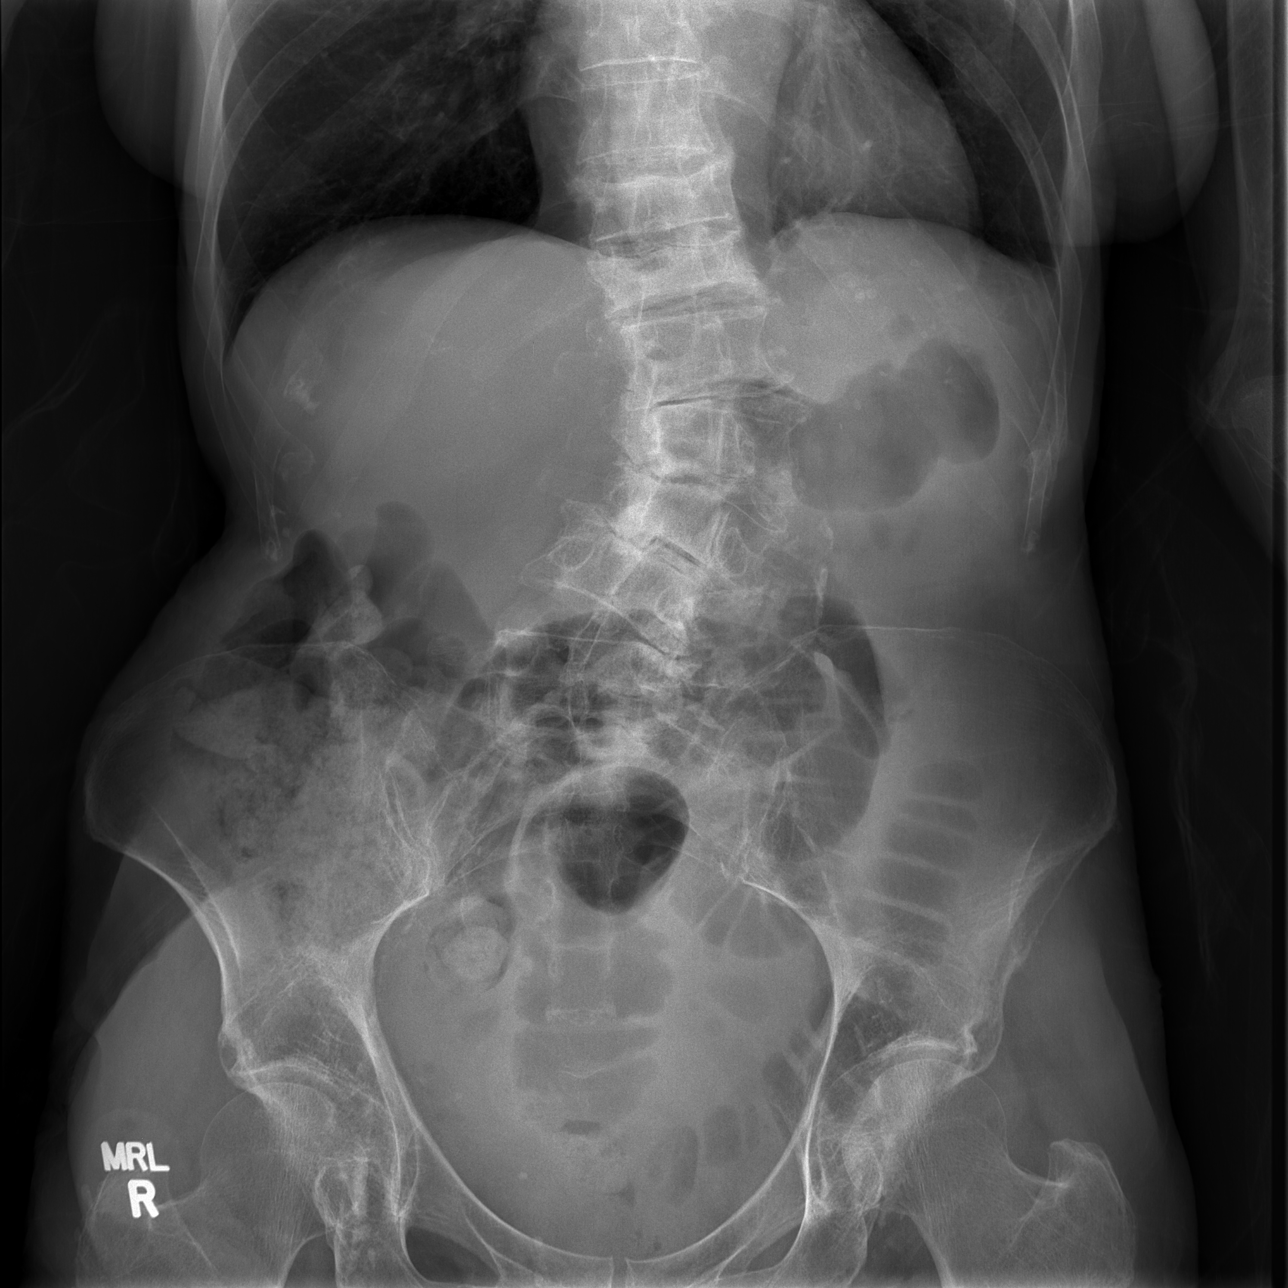

[3 of 3 positions shown; findings below may reference images not displayed]

FINDINGS: The lungs are hyperinflated compatible with emphysematous
changes.  Linear scarring is present in the retrocardiac region.  S-
shaped thoracolumbar scoliosis.  Lower thoracic compression
fracture.  There are multiple small bowel air fluid levels on the
flat and upright abdominal views.  There is no free air underneath
the hemidiaphragms.  Dilation of small bowel is present measuring
up to 53 mm.

The appearance is compatible with small bowel obstruction.  No
rectal gas is identified.  Stool is present in the region of the
cecum.  Severe lumbar degenerative changes.
IMPRESSION: Recurrent small bowel obstruction.

## 2013-06-04 ENCOUNTER — Encounter (HOSPITAL_BASED_OUTPATIENT_CLINIC_OR_DEPARTMENT_OTHER): Payer: Medicare Other | Attending: General Surgery

## 2013-06-04 DIAGNOSIS — L97909 Non-pressure chronic ulcer of unspecified part of unspecified lower leg with unspecified severity: Secondary | ICD-10-CM | POA: Insufficient documentation

## 2013-06-04 DIAGNOSIS — I872 Venous insufficiency (chronic) (peripheral): Secondary | ICD-10-CM | POA: Insufficient documentation

## 2013-06-04 DIAGNOSIS — I87319 Chronic venous hypertension (idiopathic) with ulcer of unspecified lower extremity: Secondary | ICD-10-CM | POA: Insufficient documentation

## 2013-07-09 ENCOUNTER — Encounter (HOSPITAL_BASED_OUTPATIENT_CLINIC_OR_DEPARTMENT_OTHER): Payer: Medicare Other | Attending: General Surgery

## 2013-07-09 DIAGNOSIS — I872 Venous insufficiency (chronic) (peripheral): Secondary | ICD-10-CM | POA: Insufficient documentation

## 2013-07-09 DIAGNOSIS — L97809 Non-pressure chronic ulcer of other part of unspecified lower leg with unspecified severity: Secondary | ICD-10-CM | POA: Insufficient documentation

## 2013-07-09 DIAGNOSIS — I87309 Chronic venous hypertension (idiopathic) without complications of unspecified lower extremity: Secondary | ICD-10-CM | POA: Insufficient documentation

## 2013-07-17 ENCOUNTER — Encounter (HOSPITAL_COMMUNITY): Payer: Self-pay

## 2013-07-17 ENCOUNTER — Inpatient Hospital Stay (HOSPITAL_COMMUNITY)
Admission: EM | Admit: 2013-07-17 | Discharge: 2013-08-02 | DRG: 329 | Disposition: A | Discharge status: Skilled Nursing Facility | Payer: Medicare Other | Attending: Internal Medicine | Admitting: Internal Medicine

## 2013-07-17 ENCOUNTER — Emergency Department (HOSPITAL_COMMUNITY): Payer: Medicare Other

## 2013-07-17 DIAGNOSIS — R011 Cardiac murmur, unspecified: Secondary | ICD-10-CM | POA: Diagnosis present

## 2013-07-17 DIAGNOSIS — Z66 Do not resuscitate: Secondary | ICD-10-CM

## 2013-07-17 DIAGNOSIS — I4729 Other ventricular tachycardia: Secondary | ICD-10-CM | POA: Diagnosis present

## 2013-07-17 DIAGNOSIS — Z9861 Coronary angioplasty status: Secondary | ICD-10-CM

## 2013-07-17 DIAGNOSIS — I509 Heart failure, unspecified: Secondary | ICD-10-CM | POA: Diagnosis present

## 2013-07-17 DIAGNOSIS — K565 Intestinal adhesions [bands], unspecified as to partial versus complete obstruction: Principal | ICD-10-CM | POA: Diagnosis present

## 2013-07-17 DIAGNOSIS — Z681 Body mass index (BMI) 19 or less, adult: Secondary | ICD-10-CM

## 2013-07-17 DIAGNOSIS — D649 Anemia, unspecified: Secondary | ICD-10-CM

## 2013-07-17 DIAGNOSIS — T80212A Local infection due to central venous catheter, initial encounter: Secondary | ICD-10-CM | POA: Diagnosis present

## 2013-07-17 DIAGNOSIS — K560 Paralytic ileus: Secondary | ICD-10-CM | POA: Diagnosis present

## 2013-07-17 DIAGNOSIS — I2489 Other forms of acute ischemic heart disease: Secondary | ICD-10-CM | POA: Diagnosis present

## 2013-07-17 DIAGNOSIS — I472 Ventricular tachycardia, unspecified: Secondary | ICD-10-CM

## 2013-07-17 DIAGNOSIS — R188 Other ascites: Secondary | ICD-10-CM | POA: Diagnosis present

## 2013-07-17 DIAGNOSIS — I959 Hypotension, unspecified: Secondary | ICD-10-CM | POA: Diagnosis present

## 2013-07-17 DIAGNOSIS — I499 Cardiac arrhythmia, unspecified: Secondary | ICD-10-CM

## 2013-07-17 DIAGNOSIS — K5289 Other specified noninfective gastroenteritis and colitis: Secondary | ICD-10-CM | POA: Diagnosis present

## 2013-07-17 DIAGNOSIS — A4901 Methicillin susceptible Staphylococcus aureus infection, unspecified site: Secondary | ICD-10-CM | POA: Diagnosis present

## 2013-07-17 DIAGNOSIS — Z79899 Other long term (current) drug therapy: Secondary | ICD-10-CM

## 2013-07-17 DIAGNOSIS — Y849 Medical procedure, unspecified as the cause of abnormal reaction of the patient, or of later complication, without mention of misadventure at the time of the procedure: Secondary | ICD-10-CM | POA: Diagnosis present

## 2013-07-17 DIAGNOSIS — G8929 Other chronic pain: Secondary | ICD-10-CM | POA: Diagnosis present

## 2013-07-17 DIAGNOSIS — M47814 Spondylosis without myelopathy or radiculopathy, thoracic region: Secondary | ICD-10-CM | POA: Diagnosis present

## 2013-07-17 DIAGNOSIS — I359 Nonrheumatic aortic valve disorder, unspecified: Secondary | ICD-10-CM | POA: Diagnosis present

## 2013-07-17 DIAGNOSIS — K219 Gastro-esophageal reflux disease without esophagitis: Secondary | ICD-10-CM | POA: Diagnosis present

## 2013-07-17 DIAGNOSIS — I5021 Acute systolic (congestive) heart failure: Secondary | ICD-10-CM

## 2013-07-17 DIAGNOSIS — I4891 Unspecified atrial fibrillation: Secondary | ICD-10-CM

## 2013-07-17 DIAGNOSIS — J189 Pneumonia, unspecified organism: Secondary | ICD-10-CM

## 2013-07-17 DIAGNOSIS — E876 Hypokalemia: Secondary | ICD-10-CM

## 2013-07-17 DIAGNOSIS — B958 Unspecified staphylococcus as the cause of diseases classified elsewhere: Secondary | ICD-10-CM

## 2013-07-17 DIAGNOSIS — I059 Rheumatic mitral valve disease, unspecified: Secondary | ICD-10-CM | POA: Diagnosis present

## 2013-07-17 DIAGNOSIS — Z7982 Long term (current) use of aspirin: Secondary | ICD-10-CM

## 2013-07-17 DIAGNOSIS — I498 Other specified cardiac arrhythmias: Secondary | ICD-10-CM | POA: Diagnosis present

## 2013-07-17 DIAGNOSIS — I252 Old myocardial infarction: Secondary | ICD-10-CM

## 2013-07-17 DIAGNOSIS — Z01811 Encounter for preprocedural respiratory examination: Secondary | ICD-10-CM

## 2013-07-17 DIAGNOSIS — E785 Hyperlipidemia, unspecified: Secondary | ICD-10-CM | POA: Diagnosis present

## 2013-07-17 DIAGNOSIS — M549 Dorsalgia, unspecified: Secondary | ICD-10-CM | POA: Diagnosis not present

## 2013-07-17 DIAGNOSIS — E875 Hyperkalemia: Secondary | ICD-10-CM

## 2013-07-17 DIAGNOSIS — K56609 Unspecified intestinal obstruction, unspecified as to partial versus complete obstruction: Secondary | ICD-10-CM | POA: Diagnosis present

## 2013-07-17 DIAGNOSIS — E8881 Metabolic syndrome: Secondary | ICD-10-CM | POA: Diagnosis present

## 2013-07-17 DIAGNOSIS — F411 Generalized anxiety disorder: Secondary | ICD-10-CM | POA: Diagnosis present

## 2013-07-17 DIAGNOSIS — M4120 Other idiopathic scoliosis, site unspecified: Secondary | ICD-10-CM | POA: Diagnosis present

## 2013-07-17 DIAGNOSIS — Z87891 Personal history of nicotine dependence: Secondary | ICD-10-CM

## 2013-07-17 DIAGNOSIS — I10 Essential (primary) hypertension: Secondary | ICD-10-CM

## 2013-07-17 DIAGNOSIS — J45909 Unspecified asthma, uncomplicated: Secondary | ICD-10-CM | POA: Diagnosis present

## 2013-07-17 DIAGNOSIS — IMO0002 Reserved for concepts with insufficient information to code with codable children: Secondary | ICD-10-CM | POA: Diagnosis present

## 2013-07-17 DIAGNOSIS — I350 Nonrheumatic aortic (valve) stenosis: Secondary | ICD-10-CM

## 2013-07-17 DIAGNOSIS — E43 Unspecified severe protein-calorie malnutrition: Secondary | ICD-10-CM | POA: Diagnosis present

## 2013-07-17 DIAGNOSIS — X58XXXA Exposure to other specified factors, initial encounter: Secondary | ICD-10-CM | POA: Diagnosis present

## 2013-07-17 DIAGNOSIS — I714 Abdominal aortic aneurysm, without rupture, unspecified: Secondary | ICD-10-CM | POA: Diagnosis present

## 2013-07-17 DIAGNOSIS — I214 Non-ST elevation (NSTEMI) myocardial infarction: Secondary | ICD-10-CM

## 2013-07-17 DIAGNOSIS — I2510 Atherosclerotic heart disease of native coronary artery without angina pectoris: Secondary | ICD-10-CM

## 2013-07-17 DIAGNOSIS — R64 Cachexia: Secondary | ICD-10-CM | POA: Diagnosis present

## 2013-07-17 DIAGNOSIS — M359 Systemic involvement of connective tissue, unspecified: Secondary | ICD-10-CM | POA: Diagnosis present

## 2013-07-17 DIAGNOSIS — I248 Other forms of acute ischemic heart disease: Secondary | ICD-10-CM | POA: Diagnosis present

## 2013-07-17 DIAGNOSIS — D638 Anemia in other chronic diseases classified elsewhere: Secondary | ICD-10-CM | POA: Diagnosis present

## 2013-07-17 DIAGNOSIS — R7881 Bacteremia: Secondary | ICD-10-CM

## 2013-07-17 HISTORY — DX: Unspecified atrial fibrillation: I48.91

## 2013-07-17 LAB — URINALYSIS, ROUTINE W REFLEX MICROSCOPIC
Glucose, UA: NEGATIVE mg/dL
Leukocytes, UA: NEGATIVE
Protein, ur: NEGATIVE mg/dL

## 2013-07-17 LAB — CBC WITH DIFFERENTIAL/PLATELET
Basophils Relative: 0 % (ref 0–1)
Eosinophils Absolute: 0 10*3/uL (ref 0.0–0.7)
HCT: 24.1 % — ABNORMAL LOW (ref 36.0–46.0)
Hemoglobin: 7.6 g/dL — ABNORMAL LOW (ref 12.0–15.0)
MCH: 23.8 pg — ABNORMAL LOW (ref 26.0–34.0)
MCHC: 31.5 g/dL (ref 30.0–36.0)
MCV: 75.5 fL — ABNORMAL LOW (ref 78.0–100.0)
Monocytes Absolute: 0.4 10*3/uL (ref 0.1–1.0)
Monocytes Relative: 5 % (ref 3–12)

## 2013-07-17 LAB — COMPREHENSIVE METABOLIC PANEL
Albumin: 3.3 g/dL — ABNORMAL LOW (ref 3.5–5.2)
BUN: 29 mg/dL — ABNORMAL HIGH (ref 6–23)
Chloride: 96 mEq/L (ref 96–112)
Creatinine, Ser: 1.11 mg/dL — ABNORMAL HIGH (ref 0.50–1.10)
Total Bilirubin: 0.2 mg/dL — ABNORMAL LOW (ref 0.3–1.2)
Total Protein: 6.5 g/dL (ref 6.0–8.3)

## 2013-07-17 LAB — ABO/RH: ABO/RH(D): A POS

## 2013-07-17 MED ORDER — MORPHINE SULFATE 4 MG/ML IJ SOLN
4.0000 mg | Freq: Once | INTRAMUSCULAR | Status: AC
  Administered 2013-07-17: 4 mg via INTRAVENOUS
  Filled 2013-07-17: qty 1

## 2013-07-17 MED ORDER — ASPIRIN EC 81 MG PO TBEC
81.0000 mg | DELAYED_RELEASE_TABLET | Freq: Every day | ORAL | Status: DC
  Administered 2013-07-18: 81 mg via ORAL
  Filled 2013-07-17 (×2): qty 1

## 2013-07-17 MED ORDER — TRIAMCINOLONE ACETONIDE 0.1 % EX CREA
1.0000 "application " | TOPICAL_CREAM | CUTANEOUS | Status: DC
  Administered 2013-07-21 – 2013-07-28 (×2): 1 via TOPICAL
  Filled 2013-07-17: qty 15

## 2013-07-17 MED ORDER — MORPHINE SULFATE 2 MG/ML IJ SOLN
2.0000 mg | INTRAMUSCULAR | Status: DC | PRN
  Administered 2013-07-18 – 2013-07-20 (×4): 2 mg via INTRAVENOUS
  Administered 2013-07-24 – 2013-07-25 (×3): 1 mg via INTRAVENOUS
  Filled 2013-07-17 (×9): qty 1

## 2013-07-17 MED ORDER — ONDANSETRON HCL 4 MG/2ML IJ SOLN
4.0000 mg | Freq: Once | INTRAMUSCULAR | Status: AC
  Administered 2013-07-17: 4 mg via INTRAVENOUS
  Filled 2013-07-17: qty 2

## 2013-07-17 MED ORDER — IRBESARTAN 150 MG PO TABS
150.0000 mg | ORAL_TABLET | Freq: Every day | ORAL | Status: DC
  Filled 2013-07-17: qty 1

## 2013-07-17 MED ORDER — HYDROCODONE-ACETAMINOPHEN 5-325 MG PO TABS
1.0000 | ORAL_TABLET | Freq: Four times a day (QID) | ORAL | Status: DC | PRN
  Administered 2013-07-18: 1 via ORAL
  Filled 2013-07-17: qty 1

## 2013-07-17 MED ORDER — POTASSIUM CHLORIDE CRYS ER 10 MEQ PO TBCR
10.0000 meq | EXTENDED_RELEASE_TABLET | Freq: Every day | ORAL | Status: DC
  Administered 2013-07-18: 10 meq via ORAL
  Filled 2013-07-17 (×2): qty 1

## 2013-07-17 MED ORDER — DEXTROSE-NACL 5-0.9 % IV SOLN
INTRAVENOUS | Status: DC
  Administered 2013-07-18 – 2013-07-19 (×2): via INTRAVENOUS
  Administered 2013-07-20: 50 mL via INTRAVENOUS
  Administered 2013-07-20 – 2013-07-21 (×2): via INTRAVENOUS

## 2013-07-17 MED ORDER — IOHEXOL 300 MG/ML  SOLN
50.0000 mL | Freq: Once | INTRAMUSCULAR | Status: AC | PRN
  Administered 2013-07-17: 50 mL via ORAL

## 2013-07-17 MED ORDER — ENOXAPARIN SODIUM 40 MG/0.4ML ~~LOC~~ SOLN: 40.0000 mg | SUBCUTANEOUS | Status: DC

## 2013-07-17 MED ORDER — MOMETASONE FURO-FORMOTEROL FUM 100-5 MCG/ACT IN AERO
2.0000 | INHALATION_SPRAY | Freq: Two times a day (BID) | RESPIRATORY_TRACT | Status: DC
  Administered 2013-07-18 – 2013-08-02 (×30): 2 via RESPIRATORY_TRACT
  Filled 2013-07-17 (×2): qty 8.8

## 2013-07-17 MED ORDER — ENOXAPARIN SODIUM 30 MG/0.3ML ~~LOC~~ SOLN
30.0000 mg | Freq: Every day | SUBCUTANEOUS | Status: DC
  Administered 2013-07-18 (×2): 30 mg via SUBCUTANEOUS
  Filled 2013-07-17 (×3): qty 0.3

## 2013-07-17 MED ORDER — BIOTENE DRY MOUTH MT LIQD
15.0000 mL | Freq: Two times a day (BID) | OROMUCOSAL | Status: DC
  Administered 2013-07-17 – 2013-07-24 (×14): 15 mL via OROMUCOSAL

## 2013-07-17 MED ORDER — SODIUM CHLORIDE 0.9 % IJ SOLN
3.0000 mL | Freq: Two times a day (BID) | INTRAMUSCULAR | Status: DC
  Administered 2013-07-18 – 2013-07-30 (×12): 3 mL via INTRAVENOUS

## 2013-07-17 MED ORDER — DIPHENHYDRAMINE HCL 25 MG PO CAPS: 25.0000 mg | ORAL_CAPSULE | Freq: Four times a day (QID) | ORAL | Status: DC | PRN

## 2013-07-17 MED ORDER — SODIUM CHLORIDE 0.9 % IV BOLUS (SEPSIS)
1000.0000 mL | Freq: Once | INTRAVENOUS | Status: AC
  Administered 2013-07-17: 1000 mL via INTRAVENOUS

## 2013-07-17 NOTE — ED Notes (Signed)
Patient transported to CT 

## 2013-07-17 NOTE — ED Notes (Signed)
Bed:WA02<BR> Expected date:<BR> Expected time:<BR> Means of arrival:<BR> Comments:<BR>

## 2013-07-17 NOTE — H&P (Signed)
[facility] History and Physical  Ariel Hunt ZOX:[id number] DOB: May 30, 1923    PCP:   Daisy Floro, MD   Chief Complaint: Abdominal pain.  HPI: Ariel Hunt is an 79 y.o. female with hx of mild to moderate aortic stenosis, last ECHO about a year ago, hx of CAD with cardiac stent, prior SBO felt to be because of adhesions, resolved with conservative treatments several times, hx of HTN, GERD, asthma, anemia of unclear etiology, presents to the ER with increase abdominal pain and distention for 2 days.  Her last BM was yesterday and it was normal.  She denied hemorrhoidal bleed, bloody diarrhea, epistasis, or any other obvious source of bleeding.  Her last Hb was about a year ago (May 13) and it was 11 grams/DL, then in Oct 13, it dropped to about 10grams, and today it was 7.6 grams per DL, with MCV 75 and normal Cr.  She has an abdominal CT which showed SBO with transitional point in the small bowel, quarry luminal narrowing.  She has normal WBC and the rest of her serology was unremarkable.  Her last colonoscopy was in 2009, where she was dx with collagenous colitis.  She is on very low dose of entecort.  She also has compressive Fx and has been taking taking pain medication regularly.  [name] consulted surgery and hospitalist was asked to admit her for SBO and anemia.  Rewiew of Systems:  Constitutional: Negative for malaise, fever and chills. No significant weight loss or weight gain Eyes: Negative for eye pain, redness and discharge, diplopia, visual changes, or flashes of light. ENMT: Negative for ear pain, hoarseness, nasal congestion, sinus pressure and sore throat. No headaches; tinnitus, drooling, or problem swallowing. Cardiovascular: Negative for chest pain, palpitations, diaphoresis, dyspnea and peripheral edema. ; No orthopnea, PND Respiratory: Negative for cough, hemoptysis, wheezing and stridor. No pleuritic chestpain. Gastrointestinal: Negative for nausea, vomiting,  diarrhea, constipation, melena, blood in stool, hematemesis, jaundice and rectal bleeding.    Genitourinary: Negative for frequency, dysuria, incontinence,flank pain and hematuria; Musculoskeletal: Negative for neck pain. Negative for swelling and trauma.;  Skin: . Negative for pruritus, rash, abrasions, bruising and skin lesion.; ulcerations Neuro: Negative for headache, lightheadedness and neck stiffness. Negative for weakness, altered level of consciousness , altered mental status, extremity weakness, burning feet, involuntary movement, seizure and syncope.  Psych: negative for anxiety, depression, insomnia, tearfulness, panic attacks, hallucinations, paranoia, suicidal or homicidal ideation    Past Medical History  Diagnosis Date  . Colitis   . Weight loss   . Diarrhea   . CAD (coronary artery disease)   . Hyperlipidemia   . Hypertension   . Asthma   . GERD (gastroesophageal reflux disease)     Past Surgical History  Procedure Laterality Date  . Hernia repair      inguinal  . Coronary angioplasty with stent placement    . Bowel adhesions      several    Medications:  HOME MEDS: Prior to Admission medications   Medication Sig Start Date End Date Taking? Authorizing Provider  aspirin EC 81 MG tablet Take 81 mg by mouth daily.   Yes Historical Provider, MD  budesonide (ENTOCORT EC) 3 MG 24 hr capsule Take 6 mg by mouth daily with lunch.   Yes Historical Provider, MD  diphenhydrAMINE (BENADRYL) 25 mg capsule Take 25 mg by mouth every 6 (six) hours as needed for itching.   Yes Historical Provider, MD  Fluticasone-Salmeterol (ADVAIR DISKUS) 100-50 MCG/DOSE AEPB Inhale  1 puff into the lungs every 12 (twelve) hours.     Yes Historical Provider, MD  furosemide (LASIX) 20 MG tablet Take 20 mg by mouth every morning.   Yes Historical Provider, MD  HYDROcodone-acetaminophen (NORCO/VICODIN) 5-325 MG per tablet Take 1 tablet by mouth every 6 (six) hours as needed for pain.   Yes  Historical Provider, MD  HYDROcodone-acetaminophen (VICODIN) 5-500 MG per tablet Take 1 tablet by mouth every 6 (six) hours as needed. For pain relief   Yes Historical Provider, MD  Multiple Vitamin (MULTIVITAMIN) tablet Take 1 tablet by mouth daily.     Yes Historical Provider, MD  potassium chloride (K-DUR,KLOR-CON) 10 MEQ tablet Take 10 mEq by mouth daily.   Yes Historical Provider, MD  traMADol (ULTRAM) 50 MG tablet Take 50 mg by mouth every 6 (six) hours as needed. For pain relief   Yes Historical Provider, MD  triamcinolone cream (KENALOG) 0.1 % Apply 1 application topically once a week. Apply to back for itching   Yes Historical Provider, MD  valsartan (DIOVAN) 160 MG tablet Take 80 mg by mouth 2 (two) times daily.   Yes Historical Provider, MD     Allergies:  No Known Allergies  Social History:   reports that she quit smoking about 22 years ago. She has never used smokeless tobacco. She reports that she does not drink alcohol or use illicit drugs.  Family History: Family History  Problem Relation Age of Onset  . Colon cancer Father   . Breast cancer Sister   . Breast cancer Maternal Aunt      Physical Exam: Filed Vitals:   07/17/13 1321 07/17/13 1630 07/17/13 1930 07/17/13 2031  BP: 150/75 132/68 116/52 119/56  Pulse: 79 89 87 71  Temp: 98.3 F (36.8 C)   98.3 F (36.8 C)  TempSrc: Oral   Oral  Resp: 18   16  SpO2: 99% 91% 100% 100%   Blood pressure 119/56, pulse 71, temperature 98.3 F (36.8 C), temperature source Oral, resp. rate 16, SpO2 100.00%.  GEN:  Pleasant  patient lying in the stretcher in no acute distress; cooperative with exam. PSYCH:  alert and oriented x4; does not appear anxious or depressed; affect is appropriate. HEENT: Mucous membranes pink and anicteric; PERRLA; EOM intact; no cervical lymphadenopathy nor thyromegaly or carotid bruit; no JVD; There were no stridor. Neck is very supple. Breasts:: Not examined CHEST WALL: No tenderness CHEST:  Normal respiration, clear to auscultation bilaterally.  HEART: Regular rate and rhythm.  There is a crescendo and decrescendo murmur on her left sternal border with clearly audible S2, and ended at mid to late systole. BACK: No kyphosis or scoliosis; no CVA tenderness ABDOMEN: soft and tender; no masses, no organomegaly, decreased bowel sounds; no pannus; no intertriginous candida. There is no rebound and no distention. Rectal Exam: Not done EXTREMITIES: No bone or joint deformity; age-appropriate arthropathy of the hands and knees; no edema; no ulcerations.  There is no calf tenderness. Genitalia: not examined PULSES: 2+ and symmetric SKIN: Normal hydration no rash or ulceration CNS: Cranial nerves 2-12 grossly intact no focal lateralizing neurologic deficit.  Speech is fluent; uvula elevated with phonation, facial symmetry and tongue midline. DTR are normal bilaterally, cerebella exam is intact, barbinski is negative and strengths are equaled bilaterally.  No sensory loss.   Labs on Admission:  Basic Metabolic Panel:  Recent Labs Lab 07/17/13 1400  NA 137  K 3.7  CL 96  CO2 27  GLUCOSE 129*  BUN 29*  CREATININE 1.11*  CALCIUM 10.0   Liver Function Tests:  Recent Labs Lab 07/17/13 1400  AST 24  ALT 15  ALKPHOS 37*  BILITOT 0.2*  PROT 6.5  ALBUMIN 3.3*   No results found for this basename: LIPASE, AMYLASE,  in the last 168 hours No results found for this basename: AMMONIA,  in the last 168 hours CBC:  Recent Labs Lab 07/17/13 1400  WBC 8.5  NEUTROABS 7.6  HGB 7.6*  HCT 24.1*  MCV 75.5*  PLT 431*   Cardiac Enzymes: No results found for this basename: CKTOTAL, CKMB, CKMBINDEX, TROPONINI,  in the last 168 hours  CBG: No results found for this basename: GLUCAP,  in the last 168 hours   Radiological Exams on Admission: Ct Abdomen Pelvis Wo Contrast  07/17/2013   *RADIOLOGY REPORT*  Clinical Data:  Vomiting  CT ABDOMEN AND PELVIS WITHOUT CONTRAST  Technique:   Multidetector CT imaging of the abdomen and pelvis was performed following the standard protocol without intravenous contrast.  Comparison: 07/11/2011  Findings:  Scar versus atelectasis is noted in the lung bases.  There is no pleural effusion identified.  There is no focal liver abnormality identified.  There are multiple calcified granulomas identified within the spleen.  Gallbladder is unremarkable.  No significant biliary dilatation.  Normal appearance of the pancreas.  The adrenal glands are both within normal limits.    The left kidney appears normal.  The right kidney is normal.  Urinary bladder is unremarkable.  Normal appearance of the uterus for patient's age.  There is calcified atherosclerotic disease affecting the abdominal aorta.  Aneurysmal dilatation of the infrarenal abdominal aorta is noted which measures up to 3.1 cm.  The stomach appears normal.  There is abnormal small bowel dilatation. The small bowel loops measure up to 3.82 cm.  The distal small bowel loops appear collapsed.  Transition point is in the difficult to identify due to lack of adequate oral contrast material.  There is moderate stool identified within the colon.  The distal colon and rectum appear to be decreased in caliber.  Review of the visualized osseous structures is significant for a marked scoliosis deformity.  There is multilevel degenerative disc disease identified throughout the lumbar spine.  Compression deformities are identified at T11 and T12 which are age indeterminate.  IMPRESSION:  1.  Examination is positive for a small bowel obstruction. Transition point is likely within the central small bowel mesentery. 2.  No evidence for bowel perforation. 3.  Scoliosis and multilevel spondylosis.  There are compression fractures at T11 and T12.   Original Report Authenticated By: Signa Kell, M.D.    Assessment/Plan Present on Admission:  . Small bowel obstruction . Hypertension . Hyperlipidemia . COLLAGENOUS  COLITIS . CAD (coronary artery disease) . Heart murmur, systolic . DNR (do not resuscitate)  PLAN:  Will admit her for SBO and anemia.  Surgery has seen her and recommended conservative tx at this time.  WIll continue with NGT, NPO, IVF and analgesics.  Her anemia is progressive and concerning with this SBO, as she could have a small bowel tumor such as a GIST (gastrointestinal stromal cell tumor).  She has progressive microcytic anemia over the course of last year.  I will transfuse her 2 units of PRBCs.  For her murmur, will obtain another ECHO to assess her aortic valve area and gradient.  Her cardiac status is very stable at this time.  With respect to her colitis, will hold  her steroid for now.  She is not having any sign or symptoms of adrenal insufficiency.  I confirmed tonight with her family that she is a DNR.  We'll honor her wishes.  Thank you for allowing me to partake in the care of this nice patient.    Other plans as per orders.  Code Status: DNR  Houston Siren, MD. [facility] Pager [phone] 7pm to 7am.  07/17/2013, 8:45 PM

## 2013-07-17 NOTE — Consult Note (Signed)
Re:   Ariel Hunt DOB:   02-Sep-1923 MRN:   [id number]  General Surgery Consult  ASSESSMENT AND PLAN: 1.  SBO  History of intermittent SBO (Started with abdominal wall hernia repair around 2001 - Ariel Hunt, Enterolysis and appendectomy by Dr. Orson Hunt 06/22/2005, SBO 06/2011 and 04/2012 that resolved without surgery)  Plan:  IVF, NGT, follow PE and x-rays  Patient would be high risk for any surgery.  Discussed initial plans thoroughly with patient and children.  2.  Chronic back pain  Seen by Ariel [facility]  On Vicodin and Tramadol 3.  Compression fractures of the back - T11 and T12 on CT  Long standing scoliosis and spondylosis 4.  CAD  History of coronary angioplasty with stent placement - 07/24/2001   Sees Dr. Chales Hunt 5.  Hypertension 6.  Hyperlipidemia 7.  Carotid doppler - 04/2012 - incidental left ICA stenniosis 60-79% 8.  History of collagenous colitis - on colonoscopy 2009 (hospitalized post colonoscopy)  Seen by Ariel Hunt - 01/2012  On Entocort 9.  AAA - 3.1 cm 10.  Anemia   Hgb - 7.6 - 07/17/2013  Unclear etiology.  Would transfuse to over Hgb of 9 with possible surgery on the horizon. 11.  LE wounds with LE edema.  Seen by Ariel Hunt at the [facility] weekly for the last 2 months.   Chief Complaint  Patient presents with  . Nausea   REFERRING PHYSICIAN:  Dr. Laruth Hunt, Ariel Hunt  HISTORY OF PRESENT ILLNESS: Ariel Hunt is a 79 y.o. (DOB: Jun 26, 1923)  whtie  female whose primary care physician is Ariel Floro, MD and comes to the [facility] today for nausea and vomiting.  [name] lives by herself.  Her children, Ariel Hunt and Ariel Hunt, are at the bedside.  They check on the patient almost every day.  She started feeling bad Tuesday, 07/13/2013.  Thursday, 07/15/2013, she said she was sickly. She had a bowel movement yesterday.  Today, she started vomiting and was brought to the [facility].  She had an ovary removed about 1964.  Unsure  which ovary.  She developed an abdominal wall hernia which was repaired around 2001 by Ariel Hunt.  It is unclear to me if she presented with an obstruction at that time.  But she did present with a bowel obstruction which required a enterolysis and appendectomy by Dr. Orson Hunt on 06/22/2005.  She has had two SBO (06/2011 and 04/2012) since then that resolved without surgery.  She was diagnosed with collagenous colitis by Ariel Hunt on a colonoscopy in 2009 and has been on oral steroids since then.  She denies any liver or pancreatitic disease.  She suffers from chronic pain in her back. She has not noticed any increase weakness or SOB over the last weeks.   Past Medical History  Diagnosis Date  . Colitis   . Weight loss   . Diarrhea   . CAD (coronary artery disease)   . Hyperlipidemia   . Hypertension   . Asthma   . GERD (gastroesophageal reflux disease)      Past Surgical History  Procedure Laterality Date  . Hernia repair      inguinal  . Coronary angioplasty with stent placement    . Bowel adhesions      several    No current facility-administered medications for this encounter.   Current Outpatient Prescriptions  Medication Sig Dispense Refill  . aspirin EC 81 MG  tablet Take 81 mg by mouth daily.      . budesonide (ENTOCORT EC) 3 MG 24 hr capsule Take 6 mg by mouth daily with lunch.      . diphenhydrAMINE (BENADRYL) 25 mg capsule Take 25 mg by mouth every 6 (six) hours as needed for itching.      . Fluticasone-Salmeterol (ADVAIR DISKUS) 100-50 MCG/DOSE AEPB Inhale 1 puff into the lungs every 12 (twelve) hours.        . furosemide (LASIX) 20 MG tablet Take 20 mg by mouth every morning.      Marland Kitchen HYDROcodone-acetaminophen (NORCO/VICODIN) 5-325 MG per tablet Take 1 tablet by mouth every 6 (six) hours as needed for pain.      Marland Kitchen HYDROcodone-acetaminophen (VICODIN) 5-500 MG per tablet Take 1 tablet by mouth every 6 (six) hours as needed. For pain relief      . Multiple Vitamin  (MULTIVITAMIN) tablet Take 1 tablet by mouth daily.        . potassium chloride (K-DUR,KLOR-CON) 10 MEQ tablet Take 10 mEq by mouth daily.      . traMADol (ULTRAM) 50 MG tablet Take 50 mg by mouth every 6 (six) hours as needed. For pain relief      . triamcinolone cream (KENALOG) 0.1 % Apply 1 application topically once a week. Apply to back for itching      . valsartan (DIOVAN) 160 MG tablet Take 80 mg by mouth 2 (two) times daily.         No Known Allergies  REVIEW OF SYSTEMS: Skin:  No history of rash.  No history of abnormal moles. Infection:  No history of hepatitis or HIV.  No history of MRSA. Neurologic:  History of metabolic encephalopath 04/2012 when she had the pneumonia.  Atrophy/white matter disease on brain MRI 05/09/2012 Cardiac:  Hypertension.  CAD - history of coronary angioplasty with stent placement - 07/24/2001 (Dr. Lorna Few) Pulmonary:  Community acquired pneumonia requiring hospitalization 04/2012.  COPD.  Quit smoking many years ago.  Endocrine:  No diabetes. No thyroid disease. Gastrointestinal:  No history of stomach disease.  No history of liver disease.  No history of gall bladder disease.  No history of pancreas disease.  History of collagenous colitis - on colonoscopy 2009 - seen by Ariel Hunt Urologic:  No history of kidney stones.  No history of bladder infections. Musculoskeletal:  Compression fractures of T11 andT12 Hematologic:  No bleeding disorder.  No history of anemia.  Not anticoagulated. Psycho-social:  The patient is oriented.   The patient has no obvious psychologic or social impairment to understanding our conversation and plan.  SOCIAL and FAMILY HISTORY: Widowed. Lives by self. Her children, Ariel Hunt and Ariel Hunt, are at the bedside.  PHYSICAL EXAM: BP 132/68  Pulse 89  Temp(Src) 98.3 F (36.8 C) (Oral)  Resp 18  SpO2 91%  General: Thin older WF who is alert.  She has a little trouble hearing. HEENT: Normal. Pupils equal.  Has NGT in  place. Neck: Supple. No mass.  No thyroid mass. Lymph Nodes:  No supraclavicular or cervical nodes. Lungs: Raspy cough.  Her children say this is chronic. Heart:  Occasional irregular rhythm.  3/6 systolic murmur. Abdomen: Soft. No mass. Midline infraumbilical incision.  Right lower abdominal incision.  No obvious hernia.  Decreased bowel sounds.  Distended, but not tender. She is thin. Extremities:  Thin.  Wounds on both LE dressed. Neurologic:  Grossly intact to motor and sensory function. Psychiatric:  Behavior is  normal.   DATA REVIEWED: CT, labs, and Epic notes.  Ovidio Kin, MD,  [facility] [address]  [address]   [place], [place]    [address] Phone:  [phone] FAX:  [phone]

## 2013-07-17 NOTE — Progress Notes (Signed)
Rx Brief Lovenox note  Wt= 41 kg, CrCl~40 Adjusted Lovenox in pt <45 kg  Ariel Hunt 07/17/2013 8:56 PM

## 2013-07-17 NOTE — ED Provider Notes (Signed)
Complains of vomiting onset today, time as patient has been nauseated for the past 5 days. She also complains of chronic left-sided back pain.Marland Kitchen spoke with Dr. Ezzard Standing who will consult on the case. Requests admission by medical service for bowel obstruction. Results for orders placed during the hospital encounter of 07/17/13  CBC WITH DIFFERENTIAL      Result Value Range   WBC 8.5  4.0 - 10.5 K/uL   RBC 3.19 (*) 3.87 - 5.11 MIL/uL   Hemoglobin 7.6 (*) 12.0 - 15.0 g/dL   HCT 16.1 (*) 09.6 - 04.5 %   MCV 75.5 (*) 78.0 - 100.0 fL   MCH 23.8 (*) 26.0 - 34.0 pg   MCHC 31.5  30.0 - 36.0 g/dL   RDW 40.9 (*) 81.1 - 91.4 %   Platelets 431 (*) 150 - 400 K/uL   Neutrophils Relative % 89 (*) 43 - 77 %   Neutro Abs 7.6  1.7 - 7.7 K/uL   Lymphocytes Relative 6 (*) 12 - 46 %   Lymphs Abs 0.5 (*) 0.7 - 4.0 K/uL   Monocytes Relative 5  3 - 12 %   Monocytes Absolute 0.4  0.1 - 1.0 K/uL   Eosinophils Relative 0  0 - 5 %   Eosinophils Absolute 0.0  0.0 - 0.7 K/uL   Basophils Relative 0  0 - 1 %   Basophils Absolute 0.0  0.0 - 0.1 K/uL  COMPREHENSIVE METABOLIC PANEL      Result Value Range   Sodium 137  135 - 145 mEq/L   Potassium 3.7  3.5 - 5.1 mEq/L   Chloride 96  96 - 112 mEq/L   CO2 27  19 - 32 mEq/L   Glucose, Bld 129 (*) 70 - 99 mg/dL   BUN 29 (*) 6 - 23 mg/dL   Creatinine, Ser 7.82 (*) 0.50 - 1.10 mg/dL   Calcium 95.6  8.4 - 21.3 mg/dL   Total Protein 6.5  6.0 - 8.3 g/dL   Albumin 3.3 (*) 3.5 - 5.2 g/dL   AST 24  0 - 37 U/L   ALT 15  0 - 35 U/L   Alkaline Phosphatase 37 (*) 39 - 117 U/L   Total Bilirubin 0.2 (*) 0.3 - 1.2 mg/dL   GFR calc non Af Amer 43 (*) >90 mL/min   GFR calc Af Amer 50 (*) >90 mL/min  URINALYSIS, ROUTINE W REFLEX MICROSCOPIC      Result Value Range   Color, Urine YELLOW  YELLOW   APPearance CLEAR  CLEAR   Specific Gravity, Urine 1.022  1.005 - 1.030   pH 5.5  5.0 - 8.0   Glucose, UA NEGATIVE  NEGATIVE mg/dL   Hgb urine dipstick NEGATIVE  NEGATIVE   Bilirubin  Urine NEGATIVE  NEGATIVE   Ketones, ur NEGATIVE  NEGATIVE mg/dL   Protein, ur NEGATIVE  NEGATIVE mg/dL   Urobilinogen, UA 0.2  0.0 - 1.0 mg/dL   Nitrite NEGATIVE  NEGATIVE   Leukocytes, UA NEGATIVE  NEGATIVE   Ct Abdomen Pelvis Wo Contrast  07/17/2013   *RADIOLOGY REPORT*  Clinical Data:  Vomiting  CT ABDOMEN AND PELVIS WITHOUT CONTRAST  Technique:  Multidetector CT imaging of the abdomen and pelvis was performed following the standard protocol without intravenous contrast.  Comparison: 07/11/2011  Findings:  Scar versus atelectasis is noted in the lung bases.  There is no pleural effusion identified.  There is no focal liver abnormality identified.  There are multiple calcified granulomas identified  within the spleen.  Gallbladder is unremarkable.  No significant biliary dilatation.  Normal appearance of the pancreas.  The adrenal glands are both within normal limits.    The left kidney appears normal.  The right kidney is normal.  Urinary bladder is unremarkable.  Normal appearance of the uterus for patient's age.  There is calcified atherosclerotic disease affecting the abdominal aorta.  Aneurysmal dilatation of the infrarenal abdominal aorta is noted which measures up to 3.1 cm.  The stomach appears normal.  There is abnormal small bowel dilatation. The small bowel loops measure up to 3.82 cm.  The distal small bowel loops appear collapsed.  Transition point is in the difficult to identify due to lack of adequate oral contrast material.  There is moderate stool identified within the colon.  The distal colon and rectum appear to be decreased in caliber.  Review of the visualized osseous structures is significant for a marked scoliosis deformity.  There is multilevel degenerative disc disease identified throughout the lumbar spine.  Compression deformities are identified at T11 and T12 which are age indeterminate.  IMPRESSION:  1.  Examination is positive for a small bowel obstruction. Transition point is  likely within the central small bowel mesentery. 2.  No evidence for bowel perforation. 3.  Scoliosis and multilevel spondylosis.  There are compression fractures at T11 and T12.   Original Report Authenticated By: Signa Kell, M.D.    6:30 PM pain improved after treatment with intravenous morphine. NG tube inserted by nurse. Spoke with Dr.Le will arrange for admission Dx#1 small bowel obstruction #2 anemia  Doug Sou, MD 07/17/13 2956

## 2013-07-17 NOTE — ED Provider Notes (Signed)
History    CSN: [id number] Arrival date & time 07/17/13  1245  First MD Initiated Contact with Patient 07/17/13 1349     Chief Complaint  Patient presents with  . Nausea   (Consider location/radiation/quality/duration/timing/severity/associated sxs/prior Treatment) Patient is a 79 y.o. female presenting with vomiting. The history is provided by the patient (pt complains of vomiting for two days).  Emesis Severity:  Moderate Timing:  Constant Quality:  Undigested food Able to tolerate:  Liquids Progression:  Unchanged Associated symptoms: no abdominal pain, no diarrhea and no headaches    Past Medical History  Diagnosis Date  . Colitis   . Weight loss   . Diarrhea   . CAD (coronary artery disease)   . Hyperlipidemia   . Hypertension   . Asthma   . GERD (gastroesophageal reflux disease)    Past Surgical History  Procedure Laterality Date  . Hernia repair      inguinal  . Coronary angioplasty with stent placement    . Bowel adhesions      several   Family History  Problem Relation Age of Onset  . Colon cancer Father   . Breast cancer Sister   . Breast cancer Maternal Aunt    History  Substance Use Topics  . Smoking status: Former Smoker    Quit date: 12/30/1990  . Smokeless tobacco: Never Used  . Alcohol Use: No   OB History   Grav Para Term Preterm Abortions TAB SAB Ect Mult Living                 Review of Systems  Constitutional: Negative for appetite change and fatigue.  HENT: Negative for congestion, sinus pressure and ear discharge.   Eyes: Negative for discharge.  Respiratory: Negative for cough.   Cardiovascular: Negative for chest pain.  Gastrointestinal: Positive for nausea and vomiting. Negative for abdominal pain and diarrhea.  Genitourinary: Negative for frequency and hematuria.  Musculoskeletal: Negative for back pain.  Skin: Negative for rash.  Neurological: Negative for seizures and headaches.  Psychiatric/Behavioral: Negative for  hallucinations.    Allergies  Review of patient's allergies indicates no known allergies.  Home Medications   Current Outpatient Rx  Name  Route  Sig  Dispense  Refill  . aspirin EC 81 MG tablet   Oral   Take 81 mg by mouth daily.         . budesonide (ENTOCORT EC) 3 MG 24 hr capsule   Oral   Take 6 mg by mouth daily with lunch.         . diphenhydrAMINE (BENADRYL) 25 mg capsule   Oral   Take 25 mg by mouth every 6 (six) hours as needed for itching.         . Fluticasone-Salmeterol (ADVAIR DISKUS) 100-50 MCG/DOSE AEPB   Inhalation   Inhale 1 puff into the lungs every 12 (twelve) hours.           . furosemide (LASIX) 20 MG tablet   Oral   Take 20 mg by mouth every morning.         Marland Kitchen HYDROcodone-acetaminophen (NORCO/VICODIN) 5-325 MG per tablet   Oral   Take 1 tablet by mouth every 6 (six) hours as needed for pain.         Marland Kitchen HYDROcodone-acetaminophen (VICODIN) 5-500 MG per tablet   Oral   Take 1 tablet by mouth every 6 (six) hours as needed. For pain relief         .  Multiple Vitamin (MULTIVITAMIN) tablet   Oral   Take 1 tablet by mouth daily.           . potassium chloride (K-DUR,KLOR-CON) 10 MEQ tablet   Oral   Take 10 mEq by mouth daily.         . traMADol (ULTRAM) 50 MG tablet   Oral   Take 50 mg by mouth every 6 (six) hours as needed. For pain relief         . triamcinolone cream (KENALOG) 0.1 %   Topical   Apply 1 application topically once a week. Apply to back for itching         . valsartan (DIOVAN) 160 MG tablet   Oral   Take 80 mg by mouth 2 (two) times daily.          BP 132/68  Pulse 89  Temp(Src) 98.3 F (36.8 C) (Oral)  Resp 18  SpO2 91% Physical Exam  Constitutional: She is oriented to person, place, and time. She appears well-developed.  HENT:  Head: Normocephalic.  Mucous membranes dry  Eyes: Conjunctivae and EOM are normal. No scleral icterus.  Neck: Neck supple. No thyromegaly present.  Cardiovascular:  Normal rate and regular rhythm.  Exam reveals no gallop and no friction rub.   No murmur heard. Pulmonary/Chest: No stridor. She has no wheezes. She has no rales. She exhibits no tenderness.  Abdominal: She exhibits no distension. There is no tenderness. There is no rebound.  Musculoskeletal: Normal range of motion. She exhibits no edema.  Lymphadenopathy:    She has no cervical adenopathy.  Neurological: She is oriented to person, place, and time. Coordination normal.  Skin: No rash noted. No erythema.  Psychiatric: She has a normal mood and affect. Her behavior is normal.    ED Course  Procedures (including critical care time) Labs Reviewed  CBC WITH DIFFERENTIAL - Abnormal; Notable for the following:    RBC 3.19 (*)    Hemoglobin 7.6 (*)    HCT 24.1 (*)    MCV 75.5 (*)    MCH 23.8 (*)    RDW 16.8 (*)    Platelets 431 (*)    Neutrophils Relative % 89 (*)    Lymphocytes Relative 6 (*)    Lymphs Abs 0.5 (*)    All other components within normal limits  COMPREHENSIVE METABOLIC PANEL - Abnormal; Notable for the following:    Glucose, Bld 129 (*)    BUN 29 (*)    Creatinine, Ser 1.11 (*)    Albumin 3.3 (*)    Alkaline Phosphatase 37 (*)    Total Bilirubin 0.2 (*)    GFR calc non Af Amer 43 (*)    GFR calc Af Amer 50 (*)    All other components within normal limits  URINALYSIS, ROUTINE W REFLEX MICROSCOPIC   No results found. No diagnosis found.  MDM    Benny Lennert, MD 07/17/13 [phone]

## 2013-07-17 NOTE — ED Notes (Signed)
She is here with her family.  She states she has been nauseated and occasionally vomited, since this Monday.  She is quite pale, and in no distress.  Her family tell us she has a history of "colitis".

## 2013-07-18 DIAGNOSIS — I2510 Atherosclerotic heart disease of native coronary artery without angina pectoris: Secondary | ICD-10-CM

## 2013-07-18 DIAGNOSIS — D649 Anemia, unspecified: Secondary | ICD-10-CM

## 2013-07-18 DIAGNOSIS — I10 Essential (primary) hypertension: Secondary | ICD-10-CM

## 2013-07-18 DIAGNOSIS — K56609 Unspecified intestinal obstruction, unspecified as to partial versus complete obstruction: Secondary | ICD-10-CM

## 2013-07-18 LAB — HEMOGLOBIN AND HEMATOCRIT, BLOOD: HCT: 29.8 % — ABNORMAL LOW (ref 36.0–46.0)

## 2013-07-18 LAB — FERRITIN: Ferritin: 13 ng/mL (ref 10–291)

## 2013-07-18 LAB — IRON AND TIBC
Iron: 65 ug/dL (ref 42–135)
Saturation Ratios: 17 % — ABNORMAL LOW (ref 20–55)
UIBC: 309 ug/dL (ref 125–400)

## 2013-07-18 LAB — RETICULOCYTES: Retic Count, Absolute: 57.6 10*3/uL (ref 19.0–186.0)

## 2013-07-18 MED ORDER — ONDANSETRON HCL 4 MG/2ML IJ SOLN
4.0000 mg | Freq: Four times a day (QID) | INTRAMUSCULAR | Status: DC | PRN
  Administered 2013-07-18 – 2013-07-19 (×2): 4 mg via INTRAVENOUS
  Filled 2013-07-18 (×2): qty 2

## 2013-07-18 MED ORDER — HYDRALAZINE HCL 20 MG/ML IJ SOLN: 10.0000 mg | Freq: Four times a day (QID) | INTRAMUSCULAR | Status: DC | PRN

## 2013-07-18 MED ORDER — HYDROCORTISONE SOD SUCCINATE 100 MG IJ SOLR
25.0000 mg | Freq: Two times a day (BID) | INTRAMUSCULAR | Status: DC
  Administered 2013-07-18 – 2013-07-19 (×3): 25 mg via INTRAVENOUS
  Administered 2013-07-20: 08:00:00 via INTRAVENOUS
  Administered 2013-07-20 – 2013-07-28 (×16): 25 mg via INTRAVENOUS
  Filled 2013-07-18: qty 2
  Filled 2013-07-18 (×8): qty 0.5
  Filled 2013-07-18: qty 2
  Filled 2013-07-18 (×4): qty 0.5
  Filled 2013-07-18: qty 2
  Filled 2013-07-18 (×4): qty 0.5
  Filled 2013-07-18: qty 2
  Filled 2013-07-18 (×6): qty 0.5

## 2013-07-18 NOTE — Progress Notes (Signed)
Small bowel obstruction  Subjective:  Pt feels a bit better today.  No nausea with NG in place   Relevant history: Ariel Hunt is a 79 y.o. (DOB: [birth year]) whtie female whose primary care physician is Daisy Floro, MD and comes to the [facility] ER Sat for nausea and vomiting.  [name] lives by herself. Her children, Ariel Hunt and Ariel Hunt, are at the bedside. They check on the patient almost every day.  She started feeling bad Tuesday, 07/13/2013. Thursday, 07/15/2013, she said she was sickly. She had a bowel movement on Fri. Sat, she started vomiting and was brought to the [facility] ER.  She had an ovary removed about 1964. Unsure which ovary. She developed an abdominal wall hernia which was repaired around 2001 by Dr. Luan Pulling. It is unclear to me if she presented with an obstruction at that time. But she did present with a bowel obstruction which required a enterolysis and appendectomy by Dr. Orson Slick on 06/22/2005. She has had two SBO (06/2011 and 04/2012) since then that resolved without surgery.  She was diagnosed with collagenous colitis by Dr. Kennedy Bucker on a colonoscopy in 2009 and has been on oral steroids since then. She denies any liver or pancreatitic disease. She suffers from chronic pain in her back. She was also found to be anemic upon presentation to the ED.   Objective: Vital signs in last 24 hours: Temp:  [98.1 F (36.7 C)-98.7 F (37.1 C)] 98.7 F (37.1 C) (07/20 0430) Pulse Rate:  [71-92] 90 (07/20 0430) Resp:  [16-18] 16 (07/20 0430) BP: (110-150)/(50-95) 146/88 mmHg (07/20 0430) SpO2:  [91 %-100 %] 92 % (07/20 0906) Weight:  [83 lb 1.8 oz (37.7 kg)] 83 lb 1.8 oz (37.7 kg) (07/20 0140) Last BM Date: 07/17/13  Intake/Output from previous day: 07/19 0701 - 07/20 0700 In: 407.2 [I.V.:382.2; Blood:25] Out: 900 [Urine:600; Emesis/NG output:300] Intake/Output this shift:    General appearance: alert and cooperative GI: distended, non-tender NG with bilious output.  Lab Results:   Results for orders placed during the hospital encounter of 07/17/13 (from the past 24 hour(s))  CBC WITH DIFFERENTIAL     Status: Abnormal   Collection Time    07/17/13  2:00 PM      Result Value Range   WBC 8.5  4.0 - 10.5 K/uL   RBC 3.19 (*) 3.87 - 5.11 MIL/uL   Hemoglobin 7.6 (*) 12.0 - 15.0 g/dL   HCT 40.9 (*) 81.1 - 91.4 %   MCV 75.5 (*) 78.0 - 100.0 fL   MCH 23.8 (*) 26.0 - 34.0 pg   MCHC 31.5  30.0 - 36.0 g/dL   RDW 78.2 (*) 95.6 - 21.3 %   Platelets 431 (*) 150 - 400 K/uL   Neutrophils Relative % 89 (*) 43 - 77 %   Neutro Abs 7.6  1.7 - 7.7 K/uL   Lymphocytes Relative 6 (*) 12 - 46 %   Lymphs Abs 0.5 (*) 0.7 - 4.0 K/uL   Monocytes Relative 5  3 - 12 %   Monocytes Absolute 0.4  0.1 - 1.0 K/uL   Eosinophils Relative 0  0 - 5 %   Eosinophils Absolute 0.0  0.0 - 0.7 K/uL   Basophils Relative 0  0 - 1 %   Basophils Absolute 0.0  0.0 - 0.1 K/uL  COMPREHENSIVE METABOLIC PANEL     Status: Abnormal   Collection Time    07/17/13  2:00 PM      Result Value  Range   Sodium 137  135 - 145 mEq/L   Potassium 3.7  3.5 - 5.1 mEq/L   Chloride 96  96 - 112 mEq/L   CO2 27  19 - 32 mEq/L   Glucose, Bld 129 (*) 70 - 99 mg/dL   BUN 29 (*) 6 - 23 mg/dL   Creatinine, Ser 4.09 (*) 0.50 - 1.10 mg/dL   Calcium 81.1  8.4 - 91.4 mg/dL   Total Protein 6.5  6.0 - 8.3 g/dL   Albumin 3.3 (*) 3.5 - 5.2 g/dL   AST 24  0 - 37 U/L   ALT 15  0 - 35 U/L   Alkaline Phosphatase 37 (*) 39 - 117 U/L   Total Bilirubin 0.2 (*) 0.3 - 1.2 mg/dL   GFR calc non Af Amer 43 (*) >90 mL/min   GFR calc Af Amer 50 (*) >90 mL/min  URINALYSIS, ROUTINE W REFLEX MICROSCOPIC     Status: None   Collection Time    07/17/13  3:33 PM      Result Value Range   Color, Urine YELLOW  YELLOW   APPearance CLEAR  CLEAR   Specific Gravity, Urine 1.022  1.005 - 1.030   pH 5.5  5.0 - 8.0   Glucose, UA NEGATIVE  NEGATIVE mg/dL   Hgb urine dipstick NEGATIVE  NEGATIVE   Bilirubin Urine NEGATIVE  NEGATIVE   Ketones, ur NEGATIVE   NEGATIVE mg/dL   Protein, ur NEGATIVE  NEGATIVE mg/dL   Urobilinogen, UA 0.2  0.0 - 1.0 mg/dL   Nitrite NEGATIVE  NEGATIVE   Leukocytes, UA NEGATIVE  NEGATIVE  TYPE AND SCREEN     Status: None   Collection Time    07/17/13  7:59 PM      Result Value Range   ABO/RH(D) A POS     Antibody Screen NEG     Sample Expiration 07/20/2013     Unit Number [id number]     Blood Component Type RED CELLS,LR     Unit division 00     Status of Unit ISSUED     Transfusion Status OK TO TRANSFUSE     Crossmatch Result Compatible     Unit Number [id number]     Blood Component Type RED CELLS,LR     Unit division 00     Status of Unit ISSUED     Transfusion Status OK TO TRANSFUSE     Crossmatch Result Compatible    ABO/RH     Status: None   Collection Time    07/17/13  7:59 PM      Result Value Range   ABO/RH(D) A POS    PREPARE RBC (CROSSMATCH)     Status: None   Collection Time    07/17/13  8:00 PM      Result Value Range   Order Confirmation ORDER PROCESSED BY BLOOD BANK    HEMOGLOBIN AND HEMATOCRIT, BLOOD     Status: Abnormal   Collection Time    07/18/13  7:03 AM      Result Value Range   Hemoglobin 9.6 (*) 12.0 - 15.0 g/dL   HCT 32.4 (*) 40.1 - 02.7 %     Studies/Results Radiology: CT ABD IMPRESSION 1. Examination is positive for a small bowel obstruction. Transition point is likely within the central small bowel mesentery.  2. No evidence for bowel perforation.  3. Scoliosis and multilevel spondylosis. There are compression fractures at T11 and T12.     MEDS, Scheduled .  antiseptic oral rinse  15 mL Mouth Rinse BID  . enoxaparin (LOVENOX) injection  30 mg Subcutaneous QHS  . mometasone-formoterol  2 puff Inhalation BID  . sodium chloride  3 mL Intravenous Q12H  . [START ON 07/21/2013] triamcinolone cream  1 application Topical Weekly     Assessment: Small bowel obstruction Anemia  Plan: Continue NG Ambulate and OOB as tolerated Will follow for now, would like to  avoid surgery if possible     LOS: 1 day    Vanita Panda, MD [facility], [name] [phone]   07/18/2013 10:23 AM

## 2013-07-18 NOTE — Progress Notes (Signed)
  Echocardiogram 2D Echocardiogram has been performed.  Nestor Ramp M 07/18/2013, 11:53 AM

## 2013-07-18 NOTE — Progress Notes (Signed)
[facility] PROGRESS NOTE  Ariel Hunt ZOX:[id number] DOB: 19-Jun-1923 DOA: 07/17/2013 PCP: Daisy Floro, MD  Assessment/Plan: Principal Problem:   Small bowel obstruction Active Problems:   COLLAGENOUS COLITIS   Hypertension   CAD (coronary artery disease)   Hyperlipidemia   Anemia   Heart murmur, systolic   DNR (do not resuscitate)    1. SBO:  Patient presented with 2 days of progressive abdominal distention/pain, culminating in obstipation on 07/17/13. CT abdomen/Pelvis confirmed recurrent SBO. NG-T suction, NPO, iv fluids and analgesics. Dr Ovidio Kin provided surgical consultation, and has recommended watchful waiting and conservative management for now. Surgery might prove necessary, but would be high risk.  2. Anemia: Patient has a history of chronic anemia. Her last HB in 04/2012 was 11.0, dropped to about 10.0 in 09/2012, and on 07/17/13, it was 7.6, MCV 75. Etiology is unclear. Anemia panel ordered. Transfused 2 units of PRBCs, with satisfactory bump in HB to 9.6. Will check FOBT.  3. Aortic stenosis: Patient has known moderate aortic stenosis, per 2D Echocardiogram about a year ago. Repeat 2D Echocardiogram for assessment of aortic valve area/gradient is pending. No clinical CHF, chest pain or SOB at this time. 4. History of colitis:  Patient has known history of Collagenous colitis, on Entecort. This appears clinically stable at this time.  4. CAD: Known history of CAD, S/P PCI/Stent. Stable/Asymptomatic at this time.  5. HTN: Reasonably controlled.  6. GERD: Asymptomatic. 7. LE wounds: Patient has chronic LE wounds, possibly associated with venous insufficiency. Regularly follows up at the wound care clinic.  8. Protein-Calorie malnutrition: Clinically, patient appears severely malnourished. Will need nutritionist input.    Code Status: DNR. Family Communication:  Disposition Plan: To be determined.   Brief narrative: 79 y.o. female with hx of mild to  moderate aortic stenosis, last ECHO about a year ago, hx of CAD with cardiac stent, prior SBO felt to be secondary to adhesions, resolved with conservative treatments several times, hx of HTN, dyslipidemia, GERD, asthma, anemia of unclear etiology, presenting to the ER with increased abdominal pain and distention for 2 days. Her last BM was on 07/16/13 and it was normal. She denied hemorrhoidal bleed, bloody diarrhea, epistasis, or any other obvious source of bleeding. Her last Hb was about a year ago (May 2013) and it was 11 grams/DL, then in Oct 2013, it dropped to about 10 grams, and on 07/17/13, it was 7.6 grams per DL, with MCV 75 and normal Cr. CT Abdomen/Pelvis showed SBO with transitional point in the small bowel, query luminal narrowing. She has normal WBC and the rest of her chemistries were unremarkable. Her last colonoscopy was in 2009, when she was dx with collagenous colitis. She is on very low dose of Entecort. She also has compressive Fx and has been taking taking pain medication regularly. [name] consulted surgery and hospitalist was asked to admit her for SBO and anemia.   Consultants:  Dr Ovidio Kin, surgeon.   Procedures:  CT Abdomen/Pelvis.   Antibiotics:  N/A.   HPI/Subjective: No new issues. C/O Thirst.   Objective: Vital signs in last 24 hours: Temp:  [98.1 F (36.7 C)-98.7 F (37.1 C)] 98.7 F (37.1 C) (07/20 0430) Pulse Rate:  [71-92] 90 (07/20 0430) Resp:  [16-18] 16 (07/20 0430) BP: (110-150)/(50-95) 146/88 mmHg (07/20 0430) SpO2:  [91 %-100 %] 97 % (07/20 0430) Weight:  [37.7 kg (83 lb 1.8 oz)] 37.7 kg (83 lb 1.8 oz) (07/20 0140) Weight change:  Last BM Date:  07/17/13 (small amt)  Intake/Output from previous day: 07/19 0701 - 07/20 0700 In: 407.2 [I.V.:382.2; Blood:25] Out: 900 [Urine:600; Emesis/NG output:300]     Physical Exam: General: Comfortable, alert, communicative, fully oriented, not short of breath at rest. NG-T is in situ, and draining.  Looks undernourished.  HEENT:  Mild clinical pallor, no jaundice, no conjunctival injection or discharge. Hydration is fair.  NECK:  Supple, JVP not seen, no carotid bruits, no palpable lymphadenopathy, no palpable goiter. CHEST:  Clinically clear to auscultation, no wheezes, no crackles. HEART:  Sounds 1 and 2 heard, normal, regular, no murmurs. ABDOMEN:  Moderately distended, soft, non-tender, no palpable organomegaly, no palpable masses, very scanty bowel sounds. GENITALIA:  Not examined. LOWER EXTREMITIES:  No pitting edema, palpable peripheral pulses. Has dressings on LLE.  MUSCULOSKELETAL SYSTEM:  Generalized osteoarthritic changes and dorsal kyphosis, otherwise, normal. CENTRAL NERVOUS SYSTEM:  No focal neurologic deficit on gross examination.  Lab Results:  Recent Labs  07/17/13 1400 07/18/13 0703  WBC 8.5  --   HGB 7.6* 9.6*  HCT 24.1* 29.8*  PLT 431*  --     Recent Labs  07/17/13 1400  NA 137  K 3.7  CL 96  CO2 27  GLUCOSE 129*  BUN 29*  CREATININE 1.11*  CALCIUM 10.0   No results found for this or any previous visit (from the past 240 hour(s)).   Studies/Results: Ct Abdomen Pelvis Wo Contrast  07/17/2013   *RADIOLOGY REPORT*  Clinical Data:  Vomiting  CT ABDOMEN AND PELVIS WITHOUT CONTRAST  Technique:  Multidetector CT imaging of the abdomen and pelvis was performed following the standard protocol without intravenous contrast.  Comparison: 07/11/2011  Findings:  Scar versus atelectasis is noted in the lung bases.  There is no pleural effusion identified.  There is no focal liver abnormality identified.  There are multiple calcified granulomas identified within the spleen.  Gallbladder is unremarkable.  No significant biliary dilatation.  Normal appearance of the pancreas.  The adrenal glands are both within normal limits.    The left kidney appears normal.  The right kidney is normal.  Urinary bladder is unremarkable.  Normal appearance of the uterus for patient's  age.  There is calcified atherosclerotic disease affecting the abdominal aorta.  Aneurysmal dilatation of the infrarenal abdominal aorta is noted which measures up to 3.1 cm.  The stomach appears normal.  There is abnormal small bowel dilatation. The small bowel loops measure up to 3.82 cm.  The distal small bowel loops appear collapsed.  Transition point is in the difficult to identify due to lack of adequate oral contrast material.  There is moderate stool identified within the colon.  The distal colon and rectum appear to be decreased in caliber.  Review of the visualized osseous structures is significant for a marked scoliosis deformity.  There is multilevel degenerative disc disease identified throughout the lumbar spine.  Compression deformities are identified at T11 and T12 which are age indeterminate.  IMPRESSION:  1.  Examination is positive for a small bowel obstruction. Transition point is likely within the central small bowel mesentery. 2.  No evidence for bowel perforation. 3.  Scoliosis and multilevel spondylosis.  There are compression fractures at T11 and T12.   Original Report Authenticated By: Signa Kell, M.D.    Medications: Scheduled Meds: . antiseptic oral rinse  15 mL Mouth Rinse BID  . aspirin EC  81 mg Oral Daily  . enoxaparin (LOVENOX) injection  30 mg Subcutaneous QHS  . irbesartan  150 mg Oral Daily  . mometasone-formoterol  2 puff Inhalation BID  . potassium chloride  10 mEq Oral Daily  . sodium chloride  3 mL Intravenous Q12H  . [START ON 07/21/2013] triamcinolone cream  1 application Topical Weekly   Continuous Infusions: . dextrose 5 % and 0.9% NaCl 50 mL/hr at 07/18/13 0525   PRN Meds:.diphenhydrAMINE, HYDROcodone-acetaminophen, morphine injection    LOS: 1 day   Beverly Suriano,CHRISTOPHER  [facility] Pager [phone]. If 8PM-8AM, please contact night-coverage at [email or web address], password [facility] 07/18/2013, 7:50 AM  LOS: 1 day

## 2013-07-19 ENCOUNTER — Inpatient Hospital Stay (HOSPITAL_COMMUNITY): Payer: Medicare Other

## 2013-07-19 DIAGNOSIS — I350 Nonrheumatic aortic (valve) stenosis: Secondary | ICD-10-CM | POA: Diagnosis present

## 2013-07-19 DIAGNOSIS — I359 Nonrheumatic aortic valve disorder, unspecified: Secondary | ICD-10-CM

## 2013-07-19 DIAGNOSIS — I499 Cardiac arrhythmia, unspecified: Secondary | ICD-10-CM | POA: Diagnosis present

## 2013-07-19 LAB — TYPE AND SCREEN
Antibody Screen: NEGATIVE
Unit division: 0
Unit division: 0

## 2013-07-19 LAB — TROPONIN I
Troponin I: 0.43 ng/mL (ref <0.30)
Troponin I: 0.43 ng/mL (ref <0.30)
Troponin I: 0.34 ng/mL (ref <0.30)
Troponin I: 0.4 ng/mL (ref <0.30)

## 2013-07-19 LAB — CBC
HCT: 31.5 % — ABNORMAL LOW (ref 36.0–46.0)
Hemoglobin: 10.1 g/dL — ABNORMAL LOW (ref 12.0–15.0)
MCH: 25.6 pg — ABNORMAL LOW (ref 26.0–34.0)
MCHC: 32.1 g/dL (ref 30.0–36.0)
MCV: 79.7 fL (ref 78.0–100.0)
RDW: 16.8 % — ABNORMAL HIGH (ref 11.5–15.5)

## 2013-07-19 LAB — COMPREHENSIVE METABOLIC PANEL
Albumin: 2.7 g/dL — ABNORMAL LOW (ref 3.5–5.2)
BUN: 15 mg/dL (ref 6–23)
Calcium: 8.8 mg/dL (ref 8.4–10.5)
Creatinine, Ser: 0.78 mg/dL (ref 0.50–1.10)
GFR calc Af Amer: 85 mL/min — ABNORMAL LOW (ref >90)
Glucose, Bld: 162 mg/dL — ABNORMAL HIGH (ref 70–99)
Potassium: 3.5 mEq/L (ref 3.5–5.1)
Total Protein: 5.6 g/dL — ABNORMAL LOW (ref 6.0–8.3)

## 2013-07-19 LAB — MAGNESIUM: Magnesium: 2.3 mg/dL (ref 1.5–2.5)

## 2013-07-19 LAB — HEPARIN LEVEL (UNFRACTIONATED): Heparin Unfractionated: <0.1 IU/mL — ABNORMAL LOW (ref 0.30–0.70)

## 2013-07-19 MED ORDER — AMIODARONE IV BOLUS ONLY 150 MG/100ML
150.0000 mg | Freq: Once | INTRAVENOUS | Status: AC
  Administered 2013-07-19: 150 mg via INTRAVENOUS

## 2013-07-19 MED ORDER — AMIODARONE HCL IN DEXTROSE 360-4.14 MG/200ML-% IV SOLN
60.0000 mg/h | INTRAVENOUS | Status: AC
  Administered 2013-07-19: 60 mg/h via INTRAVENOUS

## 2013-07-19 MED ORDER — METOPROLOL TARTRATE 1 MG/ML IV SOLN
2.5000 mg | Freq: Four times a day (QID) | INTRAVENOUS | Status: DC
  Administered 2013-07-19 (×2): 2.5 mg via INTRAVENOUS
  Administered 2013-07-19: 2 mg via INTRAVENOUS
  Administered 2013-07-20 – 2013-07-30 (×25): 2.5 mg via INTRAVENOUS
  Filled 2013-07-19 (×48): qty 5

## 2013-07-19 MED ORDER — SODIUM CHLORIDE 0.9 % IV SOLN: Freq: Once | INTRAVENOUS | Status: DC

## 2013-07-19 MED ORDER — HEPARIN (PORCINE) IN NACL 100-0.45 UNIT/ML-% IJ SOLN
450.0000 [IU]/h | INTRAMUSCULAR | Status: DC
  Administered 2013-07-19: 450 [IU]/h via INTRAVENOUS
  Filled 2013-07-19: qty 250

## 2013-07-19 MED ORDER — ASPIRIN 300 MG RE SUPP
300.0000 mg | Freq: Every day | RECTAL | Status: DC
  Administered 2013-07-19 – 2013-07-29 (×10): 300 mg via RECTAL
  Filled 2013-07-19 (×12): qty 1

## 2013-07-19 MED ORDER — HEPARIN (PORCINE) IN NACL 100-0.45 UNIT/ML-% IJ SOLN
600.0000 [IU]/h | INTRAMUSCULAR | Status: DC
  Filled 2013-07-19: qty 250

## 2013-07-19 MED ORDER — POTASSIUM CHLORIDE 10 MEQ/100ML IV SOLN
10.0000 meq | INTRAVENOUS | Status: AC
  Administered 2013-07-19 (×2): 10 meq via INTRAVENOUS
  Filled 2013-07-19 (×2): qty 100

## 2013-07-19 MED ORDER — DIPHENHYDRAMINE HCL 50 MG/ML IJ SOLN
12.5000 mg | Freq: Four times a day (QID) | INTRAMUSCULAR | Status: DC | PRN
  Administered 2013-07-19 – 2013-07-24 (×5): 12.5 mg via INTRAVENOUS
  Filled 2013-07-19 (×4): qty 1

## 2013-07-19 MED ORDER — AMIODARONE HCL IN DEXTROSE 360-4.14 MG/200ML-% IV SOLN
30.0000 mg/h | INTRAVENOUS | Status: DC
  Administered 2013-07-19: 59.94 mg/h via INTRAVENOUS
  Administered 2013-07-19 – 2013-07-30 (×20): 30 mg/h via INTRAVENOUS
  Filled 2013-07-19 (×26): qty 200

## 2013-07-19 MED ORDER — SODIUM CHLORIDE 0.9 % IV BOLUS (SEPSIS)
500.0000 mL | Freq: Once | INTRAVENOUS | Status: AC
  Administered 2013-07-19: 500 mL via INTRAVENOUS

## 2013-07-19 MED ORDER — DIPHENHYDRAMINE HCL 50 MG/ML IJ SOLN
INTRAMUSCULAR | Status: AC
  Administered 2013-07-19: 12.5 mg
  Filled 2013-07-19: qty 1

## 2013-07-19 MED ORDER — PHENYLEPHRINE HCL 10 MG/ML IJ SOLN
30.0000 ug/min | INTRAVENOUS | Status: DC
  Filled 2013-07-19 (×2): qty 1

## 2013-07-19 MED ORDER — SODIUM CHLORIDE 0.9 % IV BOLUS (SEPSIS)
250.0000 mL | Freq: Once | INTRAVENOUS | Status: AC
  Administered 2013-07-19: 250 mL via INTRAVENOUS

## 2013-07-19 NOTE — Plan of Care (Signed)
Problem: Phase I Progression Outcomes Goal: OOB as tolerated unless otherwise ordered Outcome: Not Applicable Date Met:  07/19/13 Bedrest today due to Ventricular Tachycardia experienced this am.

## 2013-07-19 NOTE — Progress Notes (Signed)
Patient ID: Ariel Hunt, female   DOB: [birth year], 79 y.o.   MRN: [id number] Small bowel obstruction  Subjective:  Pt feels a bit better today. Denies abd pain, small amount of vomiting last night but none since then, belching but no flatus/bm, very dry mouth   Objective: Vital signs in last 24 hours: Temp:  [97.2 F (36.2 C)-98 F (36.7 C)] 97.7 F (36.5 C) (07/21 0800) Pulse Rate:  [72-87] 87 (07/21 0300) Resp:  [18-20] 18 (07/21 0315) BP: (70-128)/(35-75) 70/35 mmHg (07/21 0315) SpO2:  [92 %-100 %] 100 % (07/21 0315) Last BM Date: 07/17/13  Intake/Output from previous day: 07/20 0701 - 07/21 0700 In: 1050 [I.V.:1050] Out: 2500 [Urine:1100; Emesis/NG output:1400] Intake/Output this shift:    General appearance: alert and cooperative GI: distended, non-tender, faint bowel sounds NG with bilious output 1400cc/24hrs  Lab Results:  Results for orders placed during the hospital encounter of 07/17/13 (from the past 24 hour(s))  VITAMIN B12     Status: None   Collection Time    07/18/13 10:38 AM      Result Value Range   Vitamin B-12 851  211 - 911 pg/mL  FOLATE     Status: None   Collection Time    07/18/13 10:38 AM      Result Value Range   Folate >20.0    IRON AND TIBC     Status: Abnormal   Collection Time    07/18/13 10:38 AM      Result Value Range   Iron 65  42 - 135 ug/dL   TIBC 782  956 - 213 ug/dL   Saturation Ratios 17 (*) 20 - 55 %   UIBC 309  125 - 400 ug/dL  FERRITIN     Status: None   Collection Time    07/18/13 10:38 AM      Result Value Range   Ferritin 13  10 - 291 ng/mL  RETICULOCYTES     Status: Abnormal   Collection Time    07/18/13 10:38 AM      Result Value Range   Retic Ct Pct 1.6  0.4 - 3.1 %   RBC. 3.60 (*) 3.87 - 5.11 MIL/uL   Retic Count, Manual 57.6  19.0 - 186.0 K/uL  CBC     Status: Abnormal   Collection Time    07/19/13  3:25 AM      Result Value Range   WBC 5.0  4.0 - 10.5 K/uL   RBC 3.95  3.87 - 5.11 MIL/uL   Hemoglobin  10.1 (*) 12.0 - 15.0 g/dL   HCT 08.6 (*) 57.8 - 46.9 %   MCV 79.7  78.0 - 100.0 fL   MCH 25.6 (*) 26.0 - 34.0 pg   MCHC 32.1  30.0 - 36.0 g/dL   RDW 62.9 (*) 52.8 - 41.3 %   Platelets 351  150 - 400 K/uL  COMPREHENSIVE METABOLIC PANEL     Status: Abnormal   Collection Time    07/19/13  3:25 AM      Result Value Range   Sodium 140  135 - 145 mEq/L   Potassium 3.5  3.5 - 5.1 mEq/L   Chloride 103  96 - 112 mEq/L   CO2 29  19 - 32 mEq/L   Glucose, Bld 162 (*) 70 - 99 mg/dL   BUN 15  6 - 23 mg/dL   Creatinine, Ser 2.44  0.50 - 1.10 mg/dL   Calcium 8.8  8.4 - 01.0  mg/dL   Total Protein 5.6 (*) 6.0 - 8.3 g/dL   Albumin 2.7 (*) 3.5 - 5.2 g/dL   AST 14  0 - 37 U/L   ALT 9  0 - 35 U/L   Alkaline Phosphatase 31 (*) 39 - 117 U/L   Total Bilirubin 0.3  0.3 - 1.2 mg/dL   GFR calc non Af Amer 73 (*) >90 mL/min   GFR calc Af Amer 85 (*) >90 mL/min  TROPONIN I     Status: Abnormal   Collection Time    07/19/13  3:25 AM      Result Value Range   Troponin I 0.43 (*) <0.30 ng/mL  MAGNESIUM     Status: None   Collection Time    07/19/13  3:25 AM      Result Value Range   Magnesium 2.3  1.5 - 2.5 mg/dL     MEDS, Scheduled . sodium chloride   Intravenous Once  . antiseptic oral rinse  15 mL Mouth Rinse BID  . aspirin  300 mg Rectal Daily  . hydrocortisone sod succinate (SOLU-CORTEF) inj  25 mg Intravenous Q12H  . metoprolol  2.5 mg Intravenous Q6H  . mometasone-formoterol  2 puff Inhalation BID  . sodium chloride  250 mL Intravenous Once  . sodium chloride  3 mL Intravenous Q12H  . [START ON 07/21/2013] triamcinolone cream  1 application Topical Weekly     Assessment: Small bowel obstruction Anemia  Plan: Film this am shows SB distention with thickening, ?edema Continue NG, but will allow some ice chips Ambulate and OOB as tolerated Will follow for now, would like to avoid surgery if possible     LOS: 2 days   Octavia Mottola, [facility],  [name] [phone]   07/19/2013 8:05 AM

## 2013-07-19 NOTE — Consult Note (Addendum)
Admit date: 07/17/2013 Referring Physician  Dr. Lavera Guise Primary Physician  Dr. Tenny Craw Primary Cardiologist  Dr. Anne Fu Reason for Consultation  Wide complex tachycardia  HPI: Ariel Hunt is an 79 y.o. female with hx of moderate aortic stenosis, last ECHO about a year ago, hx of CAD with cardiac stent in setting of NSTEMI and VTach, prior SBO felt to be because of adhesions, resolved with conservative treatments several times, hx of HTN, GERD, asthma, anemia of unclear etiology, presented to the ER with increase abdominal pain and distention for 2 days. She denied hemorrhoidal bleed, bloody diarrhea, epistasis, or any other obvious source of bleeding. She has an abdominal CT which showed SBO. She has normal WBC and the rest of her serology was unremarkable. Cardiology is now asked to consult due to new onset afib which started about an hour ago.  The nurse stated that she went initially into afib with RVR but then started having wide complex tachycardia at 179bpm with hypotension.  She was given Amiodarone 150mg  IV and Lopressor 2/5mg  IV and wide complex tachycardia stopped.  Currently she is in afib at 117bpm.  Initial troponin is elevated at 0.43.  Patient is DNR.    PMH:   Past Medical History  Diagnosis Date  . Colitis   . Weight loss   . Diarrhea    Ventricular tachycardia and afib with RVR with syncope in setting of ischemia    EF 45-50% by cath 2002, EF normal by echo 06/2013   . CAD (coronary artery disease) s/p PCI of OM with nonobstructive ASCAD elsewhere   . Hyperlipidemia   . Hypertension   . Asthma   . GERD (gastroesophageal reflux disease)      PSH:   Past Surgical History  Procedure Laterality Date  . Hernia repair      inguinal  . Coronary angioplasty with stent placement    . Bowel adhesions      several    Allergies:  Review of patient's allergies indicates no known allergies. Prior to Admit Meds:   Prescriptions prior to admission  Medication Sig Dispense Refill  .  aspirin EC 81 MG tablet Take 81 mg by mouth daily.      . budesonide (ENTOCORT EC) 3 MG 24 hr capsule Take 6 mg by mouth daily with lunch.      . diphenhydrAMINE (BENADRYL) 25 mg capsule Take 25 mg by mouth every 6 (six) hours as needed for itching.      . Fluticasone-Salmeterol (ADVAIR DISKUS) 100-50 MCG/DOSE AEPB Inhale 1 puff into the lungs every 12 (twelve) hours.        . furosemide (LASIX) 20 MG tablet Take 20 mg by mouth every morning.      Marland Kitchen HYDROcodone-acetaminophen (NORCO/VICODIN) 5-325 MG per tablet Take 1 tablet by mouth every 6 (six) hours as needed for pain.      Marland Kitchen HYDROcodone-acetaminophen (VICODIN) 5-500 MG per tablet Take 1 tablet by mouth every 6 (six) hours as needed. For pain relief      . Multiple Vitamin (MULTIVITAMIN) tablet Take 1 tablet by mouth daily.        . potassium chloride (K-DUR,KLOR-CON) 10 MEQ tablet Take 10 mEq by mouth daily.      . traMADol (ULTRAM) 50 MG tablet Take 50 mg by mouth every 6 (six) hours as needed. For pain relief      . triamcinolone cream (KENALOG) 0.1 % Apply 1 application topically once a week. Apply to back for itching      .  valsartan (DIOVAN) 160 MG tablet Take 80 mg by mouth 2 (two) times daily.       Fam HX:    Family History  Problem Relation Age of Onset  . Colon cancer Father   . Breast cancer Sister   . Breast cancer Maternal Aunt    Social HX:    History   Social History  . Marital Status: Widowed    Spouse Name: N/A    Number of Children: 3  . Years of Education: N/A   Occupational History  . Not on file.   Social History Main Topics  . Smoking status: Former Smoker    Types: Cigarettes    Quit date: 12/30/1990  . Smokeless tobacco: Never Used  . Alcohol Use: No  . Drug Use: No  . Sexually Active: No   Other Topics Concern  . Not on file   Social History Narrative  . No narrative on file     ROS:  All 11 ROS were addressed and are negative except what is stated in the HPI  Physical Exam: Blood  pressure 118/74, pulse 87, temperature 97.2 F (36.2 C), temperature source Oral, resp. rate 18, height 5\' 4"  (1.626 m), weight 37.7 kg (83 lb 1.8 oz), SpO2 100.00%.    General: Well developed, well nourished, in no acute distress Head: Eyes PERRLA, No xanthomas.   Normal cephalic and atramatic  Lungs:   Crackles at bases bilaterally Heart:   Irregularly irregular S1 S2 Pulses are 2+ & equal.            No carotid bruit. No JVD.  No abdominal bruits. No femoral bruits. Abdomen: Bowel sounds are positive, abdomen soft and non-tender without masses  Extremities:   No clubbing, cyanosis or edema.  DP +1 Neuro: Alert and oriented X 3. Psych:  Good affect, responds appropriately    Labs:   Lab Results  Component Value Date   WBC 5.0 07/19/2013   HGB 10.1* 07/19/2013   HCT 31.5* 07/19/2013   MCV 79.7 07/19/2013   PLT 351 07/19/2013    Recent Labs Lab 07/19/13 0325  NA 140  K 3.5  CL 103  CO2 29  BUN 15  CREATININE 0.78  CALCIUM 8.8  PROT 5.6*  BILITOT 0.3  ALKPHOS 31*  ALT 9  AST 14  GLUCOSE 162*   No results found for this basename: PTT   Lab Results  Component Value Date   INR 1.03 05/08/2012   Lab Results  Component Value Date   CKTOTAL 47 07/11/2011   CKMB 3.4 07/11/2011   TROPONINI <0.30 07/11/2011     Lab Results  Component Value Date   CHOL 130 05/09/2012   Lab Results  Component Value Date   HDL 39* 05/09/2012   Lab Results  Component Value Date   LDLCALC 75 05/09/2012   Lab Results  Component Value Date   TRIG 78 05/09/2012   Lab Results  Component Value Date   CHOLHDL 3.3 05/09/2012   No results found for this basename: LDLDIRECT      Radiology:  Ct Abdomen Pelvis Wo Contrast  07/17/2013   *RADIOLOGY REPORT*  Clinical Data:  Vomiting  CT ABDOMEN AND PELVIS WITHOUT CONTRAST  Technique:  Multidetector CT imaging of the abdomen and pelvis was performed following the standard protocol without intravenous contrast.  Comparison: 07/11/2011  Findings:   Scar versus atelectasis is noted in the lung bases.  There is no pleural effusion identified.  There is no  focal liver abnormality identified.  There are multiple calcified granulomas identified within the spleen.  Gallbladder is unremarkable.  No significant biliary dilatation.  Normal appearance of the pancreas.  The adrenal glands are both within normal limits.    The left kidney appears normal.  The right kidney is normal.  Urinary bladder is unremarkable.  Normal appearance of the uterus for patient's age.  There is calcified atherosclerotic disease affecting the abdominal aorta.  Aneurysmal dilatation of the infrarenal abdominal aorta is noted which measures up to 3.1 cm.  The stomach appears normal.  There is abnormal small bowel dilatation. The small bowel loops measure up to 3.82 cm.  The distal small bowel loops appear collapsed.  Transition point is in the difficult to identify due to lack of adequate oral contrast material.  There is moderate stool identified within the colon.  The distal colon and rectum appear to be decreased in caliber.  Review of the visualized osseous structures is significant for a marked scoliosis deformity.  There is multilevel degenerative disc disease identified throughout the lumbar spine.  Compression deformities are identified at T11 and T12 which are age indeterminate.  IMPRESSION:  1.  Examination is positive for a small bowel obstruction. Transition point is likely within the central small bowel mesentery. 2.  No evidence for bowel perforation. 3.  Scoliosis and multilevel spondylosis.  There are compression fractures at T11 and T12.   Original Report Authenticated By: Signa Kell, M.D.    EKG:  #1 wide complex tachycardia at 179bpm  #2 afib with RVR with marked ST depression in the lateral precordial leads  ASSESSMENT:  1.  Ventricular tachycardia - resolved on IV Amiodarone.  Her LVF is normal on recent echo.   2.  Atrial fibrillation with RVR now HR in 90's  on IV Amio 3.  Hypokalemia which may be contributing to afib 4.  CAD s/p remote NSTEMI with PCI of OM 2002 with nonobstructive disease elsewhere.   5.  SBO - medical management at this time due to unstable cardiac arrhythmias 6. HTN 7.  Moderate AS by recent echo with normal LVF  PLAN:   1.  Continue IV Amio gtt 2.  Replete potassium 3.  Discussed with Hospitalist - They are ok with starting IV Heparin at this time for NSTEMI 4.  IV Lopressor as BP tolerates 5.  Check BNP and chest xray to eval lung findings 6.  ASA per rectum  Quintella Reichert, MD  07/19/2013  4:42 AM

## 2013-07-19 NOTE — Progress Notes (Signed)
Subjective: Interval History: I was paged by nursing and rapid response for new afib and wide complex tachycardia on telemetry. Patient was initially not symptomatic but as she kept going into the wide complex tachycardia she felt sob and flushed. HR during these periods 180-200. Pt also hypotensive during these periods with systolic BP 60-70's. 1L bolus was given without any change in status. Labs drawn (CBC, CMP, mag and troponin) Dr. Lavera Guise notified and Cardiology consulted. Pt was transferred to ICU and family notified.  Objective: Vital signs in last 24 hours: Temp:  [97.2 F (36.2 C)-98.7 F (37.1 C)] 97.2 F (36.2 C) (07/21 0300) Pulse Rate:  [72-90] 87 (07/21 0300) Resp:  [16-20] 18 (07/21 0300) BP: (118-146)/(71-88) 118/74 mmHg (07/21 0300) SpO2:  [92 %-100 %] 100 % (07/21 0300)  Intake/Output from previous day: 07/20 0701 - 07/21 0700 In: 600 [I.V.:600] Out: 1700 [Urine:1100] Intake/Output this shift: Total I/O In: 0  Out: 1100 [Urine:500; Emesis/NG output:600]  General appearance: alert, mild distress, pale. NG tube in place, connected to wall suction. Lungs: clear to auscultation bilaterally Heart: irregularly irregular rhythm, murmur left sternal border Abdomen: soft, non-tender to palpation  Results for orders placed during the hospital encounter of 07/17/13 (from the past 24 hour(s))  HEMOGLOBIN AND HEMATOCRIT, BLOOD     Status: Abnormal   Collection Time    07/18/13  7:03 AM      Result Value Range   Hemoglobin 9.6 (*) 12.0 - 15.0 g/dL   HCT 16.1 (*) 09.6 - 04.5 %  TSH     Status: None   Collection Time    07/18/13  7:03 AM      Result Value Range   TSH 0.592  0.350 - 4.500 uIU/mL  VITAMIN B12     Status: None   Collection Time    07/18/13 10:38 AM      Result Value Range   Vitamin B-12 851  211 - 911 pg/mL  FOLATE     Status: None   Collection Time    07/18/13 10:38 AM      Result Value Range   Folate >20.0    IRON AND TIBC     Status: Abnormal    Collection Time    07/18/13 10:38 AM      Result Value Range   Iron 65  42 - 135 ug/dL   TIBC 409  811 - 914 ug/dL   Saturation Ratios 17 (*) 20 - 55 %   UIBC 309  125 - 400 ug/dL  FERRITIN     Status: None   Collection Time    07/18/13 10:38 AM      Result Value Range   Ferritin 13  10 - 291 ng/mL  RETICULOCYTES     Status: Abnormal   Collection Time    07/18/13 10:38 AM      Result Value Range   Retic Ct Pct 1.6  0.4 - 3.1 %   RBC. 3.60 (*) 3.87 - 5.11 MIL/uL   Retic Count, Manual 57.6  19.0 - 186.0 K/uL  CBC     Status: Abnormal   Collection Time    07/19/13  3:25 AM      Result Value Range   WBC 5.0  4.0 - 10.5 K/uL   RBC 3.95  3.87 - 5.11 MIL/uL   Hemoglobin 10.1 (*) 12.0 - 15.0 g/dL   HCT 78.2 (*) 95.6 - 21.3 %   MCV 79.7  78.0 - 100.0 fL   MCH  25.6 (*) 26.0 - 34.0 pg   MCHC 32.1  30.0 - 36.0 g/dL   RDW 16.1 (*) 09.6 - 04.5 %   Platelets 351  150 - 400 K/uL    Studies/Results: Ct Abdomen Pelvis Wo Contrast  07/17/2013   *RADIOLOGY REPORT*  Clinical Data:  Vomiting  CT ABDOMEN AND PELVIS WITHOUT CONTRAST  Technique:  Multidetector CT imaging of the abdomen and pelvis was performed following the standard protocol without intravenous contrast.  Comparison: 07/11/2011  Findings:  Scar versus atelectasis is noted in the lung bases.  There is no pleural effusion identified.  There is no focal liver abnormality identified.  There are multiple calcified granulomas identified within the spleen.  Gallbladder is unremarkable.  No significant biliary dilatation.  Normal appearance of the pancreas.  The adrenal glands are both within normal limits.    The left kidney appears normal.  The right kidney is normal.  Urinary bladder is unremarkable.  Normal appearance of the uterus for patient's age.  There is calcified atherosclerotic disease affecting the abdominal aorta.  Aneurysmal dilatation of the infrarenal abdominal aorta is noted which measures up to 3.1 cm.  The stomach appears  normal.  There is abnormal small bowel dilatation. The small bowel loops measure up to 3.82 cm.  The distal small bowel loops appear collapsed.  Transition point is in the difficult to identify due to lack of adequate oral contrast material.  There is moderate stool identified within the colon.  The distal colon and rectum appear to be decreased in caliber.  Review of the visualized osseous structures is significant for a marked scoliosis deformity.  There is multilevel degenerative disc disease identified throughout the lumbar spine.  Compression deformities are identified at T11 and T12 which are age indeterminate.  IMPRESSION:  1.  Examination is positive for a small bowel obstruction. Transition point is likely within the central small bowel mesentery. 2.  No evidence for bowel perforation. 3.  Scoliosis and multilevel spondylosis.  There are compression fractures at T11 and T12.   Original Report Authenticated By: Signa Kell, M.D.    Scheduled Meds: . antiseptic oral rinse  15 mL Mouth Rinse BID  . enoxaparin (LOVENOX) injection  30 mg Subcutaneous QHS  . hydrocortisone sod succinate (SOLU-CORTEF) inj  25 mg Intravenous Q12H  . mometasone-formoterol  2 puff Inhalation BID  . sodium chloride  3 mL Intravenous Q12H  . [START ON 07/21/2013] triamcinolone cream  1 application Topical Weekly   Continuous Infusions: . dextrose 5 % and 0.9% NaCl 50 mL/hr at 07/19/13 0118   PRN Meds:hydrALAZINE, morphine injection, ondansetron (ZOFRAN) IV  Assessment/Plan:  New onset Afib with RVR, ACS - Troponins trending, initial 0.43. Lytes normal. Cardiology consulted. Amiodarone and metoprolol given, HR now 90-low 100s. Heparin drip.   SBO - repeat abdominal and CXR. High surgical risk. Continue NG to intermittent wall suction.   LOS: 2 days   REIDLER, RANDALL M, PA-C

## 2013-07-19 NOTE — Progress Notes (Addendum)
Dtr [name] notified updating her of her mother's current condition.  Message left for Son.

## 2013-07-19 NOTE — Progress Notes (Signed)
ANTICOAGULATION CONSULT NOTE - Initial Consult  Pharmacy Consult for Heparin Indication: NSTEMI  No Known Allergies  Patient Measurements: Height: 5\' 4"  (162.6 cm) Weight: 83 lb 1.8 oz (37.7 kg) IBW/kg (Calculated) : 54.7  Vital Signs: Temp: 97.2 F (36.2 C) (07/21 0300) Temp src: Oral (07/21 0300) BP: 118/74 mmHg (07/21 0300) Pulse Rate: 87 (07/21 0300)  Labs:  Recent Labs  07/17/13 1400 07/18/13 0703 07/19/13 0325  HGB 7.6* 9.6* 10.1*  HCT 24.1* 29.8* 31.5*  PLT 431*  --  351  CREATININE 1.11*  --  0.78  TROPONINI  --   --  0.43*    Estimated Creatinine Clearance: 29.5 ml/min (by C-G formula based on Cr of 0.78).   Medical History: Past Medical History  Diagnosis Date  . Colitis   . Weight loss   . Diarrhea   . CAD (coronary artery disease)   . Hyperlipidemia   . Hypertension   . Asthma   . GERD (gastroesophageal reflux disease)     Medications:  Scheduled:  . amiodarone  150 mg Intravenous Once  . antiseptic oral rinse  15 mL Mouth Rinse BID  . hydrocortisone sod succinate (SOLU-CORTEF) inj  25 mg Intravenous Q12H  . metoprolol  2.5 mg Intravenous Q6H  . mometasone-formoterol  2 puff Inhalation BID  . potassium chloride  10 mEq Intravenous Q1 Hr x 2  . sodium chloride  3 mL Intravenous Q12H  . [START ON 07/21/2013] triamcinolone cream  1 application Topical Weekly   Infusions:  . amiodarone (NEXTERONE PREMIX) 360 mg/200 mL dextrose    . amiodarone (NEXTERONE PREMIX) 360 mg/200 mL dextrose    . dextrose 5 % and 0.9% NaCl 50 mL/hr at 07/19/13 0118    Assessment:  79 year old female admitted 7/19 with small bowel obstruction  On 7/19 Lovenox 30mg  sq q24h was initiated for DVT prophylaxis  Troponin this AM = 0.43; patient found to have new AFib with RVR and transferred to ICU  IV heparin to begin with no bolus.  As patient's CrCl < 30 ml/min and Body weight = 37.7kg, patient's prior regimen of Lovenox 30mg  sq q24h would be considered close to  treatment therefore, we will have to delay start of heparin therapy until Lovenox dose wears off.  Last Lovenox 30mg  dose given on 7/20 @ 21:25  Goal of Therapy:  Heparin level 0.3-0.7 units/ml Monitor platelets by anticoagulation protocol: Yes   Plan:   No IV heparin bolus  Due to Lovenox dose given @ 21:25 on 7/20, will begin IV heparin drip @ 12:00 today at a rate of 450 units/hr  Check heparin level 8 hrs after IV heparin started  Check daily heparin level & CBC while on heparin  Verne Cove, Joselyn Glassman, PharmD 07/19/2013,5:13 AM

## 2013-07-19 NOTE — Progress Notes (Signed)
Rx Brief Heparin note  Assessement:   HL=<0.10 units/ml after 450 units/hr @ Css.  No bleeding/IV interuptions per RN.  Plan:  Increase heparin drip to 600 units/hr  Recheck HL @ 0800.  Lorenza Evangelist 07/19/2013 11:07 PM

## 2013-07-19 NOTE — Progress Notes (Signed)
[facility] PROGRESS NOTE  Ariel Hunt ZOX:[id number] DOB: [birth year] DOA: 07/17/2013 PCP: Daisy Floro, MD  Assessment/Plan: Principal Problem:   Small bowel obstruction Active Problems:   COLLAGENOUS COLITIS   Hypertension   CAD (coronary artery disease)   Hyperlipidemia   Anemia   Heart murmur, systolic   DNR (do not resuscitate)    1. SBO:  Patient presented with 2 days of progressive abdominal distention/pain, culminating in obstipation on 07/17/13. CT abdomen/Pelvis confirmed recurrent SBO. NG-T suction, NPO, iv fluids and analgesics. Dr Ovidio Kin provided surgical consultation, and has recommended watchful waiting and conservative management for now. Surgery might prove necessary, but would be high risk.  2. Anemia: Patient has a history of chronic anemia. Her last HB in 04/2012 was 11.0, dropped to about 10.0 in 09/2012, and on 07/17/13, it was 7.6, MCV 75. Etiology is unclear. Anemia panel ordered. Transfused 2 units of PRBCs, with satisfactory bump in HB to 9.6. FOBT is pending.  3. Aortic stenosis: Patient has known history of moderate aortic stenosis, per 2D Echocardiogram about a year ago. Repeat 2D Echocardiogram of 07/18/13, showed normal LV cavity size, normal systolic function, and no regional wall motion abnormalities. There was aortic valve severe stenosis. Valve area: 0.8cm^2(VTI). Valve area: 0.79cm^2 (Vmax), moderate mitral regurgitation, moderately dilated LA and RA, as well as PA peak pressure: 45mm Hg (S). 4. Arrhythmia/Hypotension: Overnight, patient developed tachyarrhythmias, with V. Tach, SVT and A,fib with RVR. Dr Armanda Magic provided cardiology consultation, patient was managed with Amiodarone bolus, iv L opressor and Amiodarone infusion. Troponin I bumped to 0.43. This AM, patient was initially in fast A. Fib, but has just reverted to SR, with HR in the 80s. Systolic BP 70-83. Have bolused 250 ml NS, and after discussion with family, have commenced  trial of NeoSynephrine. Family is is accepting of the fact that if this fails, no further escalation will be attempted.  5. History of colitis:  Patient has known history of Collagenous colitis, on Entocort. This appears clinically stable at this time. On stress doses of steroids. 6. CAD: Known history of CAD, S/P PCI/Stent. As described above, now has bump in CEs.  7. HTN: Hitherto reasonably controlled. Now hypotensive. See discussion in #4 above.  8. GERD: Asymptomatic. 9. LE wounds: Patient has chronic LE wounds, possibly associated with venous insufficiency. Regularly follows up at the wound care clinic. WOC consulted.  10. Protein-Calorie malnutrition: Clinically, patient appears severely malnourished. Will need nutritionist input.    Code Status: DNR. Family Communication:  Disposition Plan: To be determined.   Brief narrative: 79 y.o. female with hx of mild to moderate aortic stenosis, last ECHO about a year ago, hx of CAD with cardiac stent, prior SBO felt to be secondary to adhesions, resolved with conservative treatments several times, hx of HTN, dyslipidemia, GERD, asthma, anemia of unclear etiology, presenting to the ER with increased abdominal pain and distention for 2 days. Her last BM was on 07/16/13 and it was normal. She denied hemorrhoidal bleed, bloody diarrhea, epistasis, or any other obvious source of bleeding. Her last Hb was about a year ago (May 2013) and it was 11 grams/DL, then in Oct 2013, it dropped to about 10 grams, and on 07/17/13, it was 7.6 grams per DL, with MCV 75 and normal Cr. CT Abdomen/Pelvis showed SBO with transitional point in the small bowel, query luminal narrowing. She has normal WBC and the rest of her chemistries were unremarkable. Her last colonoscopy was in 2009, when she  was dx with collagenous colitis. She is on very low dose of Entecort. She also has compressive Fx and has been taking taking pain medication regularly. [name] consulted surgery and  hospitalist was asked to admit her for SBO and anemia.   Consultants:  Dr Ovidio Kin, surgeon.   Dr Armanda Magic, cardiologist.  Procedures:  CT Abdomen/Pelvis.   Antibiotics:  N/A.   HPI/Subjective: Events of last night noted. Has tachyarrhythmia/hypotension. No SOB, no chest pain.   Objective: Vital signs in last 24 hours: Temp:  [97.2 F (36.2 C)-98 F (36.7 C)] 97.2 F (36.2 C) (07/21 0300) Pulse Rate:  [72-87] 87 (07/21 0300) Resp:  [18-20] 18 (07/21 0315) BP: (70-128)/(35-75) 70/35 mmHg (07/21 0315) SpO2:  [92 %-100 %] 100 % (07/21 0315) Weight change:  Last BM Date: 07/17/13  Intake/Output from previous day: 07/20 0701 - 07/21 0700 In: 1050 [I.V.:1050] Out: 2500 [Urine:1100; Emesis/NG output:1400]     Physical Exam: General: Alert, communicative, not short of breath at rest. NG-T is in situ, and draining. Looks undernourished. Anxious.  HEENT:  Mild clinical pallor, no jaundice, no conjunctival injection or discharge. Hydration is fair.  NECK:  Supple, JVP not seen, no carotid bruits, no palpable lymphadenopathy, no palpable goiter. CHEST:  Few coarse crackles in both bases. HEART:  Sounds 1 and 2 heard, normal, regular, no murmurs. ABDOMEN:  Moderately distended, soft, non-tender, no palpable organomegaly, no palpable masses, very scanty bowel sounds. GENITALIA:  Not examined. LOWER EXTREMITIES:  No pitting edema, palpable peripheral pulses. Has dressings on LLE.  MUSCULOSKELETAL SYSTEM:  Generalized osteoarthritic changes and dorsal kyphosis, otherwise, normal. CENTRAL NERVOUS SYSTEM:  No focal neurologic deficit on gross examination.  Lab Results:  Recent Labs  07/17/13 1400 07/18/13 0703 07/19/13 0325  WBC 8.5  --  5.0  HGB 7.6* 9.6* 10.1*  HCT 24.1* 29.8* 31.5*  PLT 431*  --  351    Recent Labs  07/17/13 1400 07/19/13 0325  NA 137 140  K 3.7 3.5  CL 96 103  CO2 27 29  GLUCOSE 129* 162*  BUN 29* 15  CREATININE 1.11* 0.78   CALCIUM 10.0 8.8   No results found for this or any previous visit (from the past 240 hour(s)).   Studies/Results: Ct Abdomen Pelvis Wo Contrast  07/17/2013   *RADIOLOGY REPORT*  Clinical Data:  Vomiting  CT ABDOMEN AND PELVIS WITHOUT CONTRAST  Technique:  Multidetector CT imaging of the abdomen and pelvis was performed following the standard protocol without intravenous contrast.  Comparison: 07/11/2011  Findings:  Scar versus atelectasis is noted in the lung bases.  There is no pleural effusion identified.  There is no focal liver abnormality identified.  There are multiple calcified granulomas identified within the spleen.  Gallbladder is unremarkable.  No significant biliary dilatation.  Normal appearance of the pancreas.  The adrenal glands are both within normal limits.    The left kidney appears normal.  The right kidney is normal.  Urinary bladder is unremarkable.  Normal appearance of the uterus for patient's age.  There is calcified atherosclerotic disease affecting the abdominal aorta.  Aneurysmal dilatation of the infrarenal abdominal aorta is noted which measures up to 3.1 cm.  The stomach appears normal.  There is abnormal small bowel dilatation. The small bowel loops measure up to 3.82 cm.  The distal small bowel loops appear collapsed.  Transition point is in the difficult to identify due to lack of adequate oral contrast material.  There is moderate stool identified  within the colon.  The distal colon and rectum appear to be decreased in caliber.  Review of the visualized osseous structures is significant for a marked scoliosis deformity.  There is multilevel degenerative disc disease identified throughout the lumbar spine.  Compression deformities are identified at T11 and T12 which are age indeterminate.  IMPRESSION:  1.  Examination is positive for a small bowel obstruction. Transition point is likely within the central small bowel mesentery. 2.  No evidence for bowel perforation. 3.   Scoliosis and multilevel spondylosis.  There are compression fractures at T11 and T12.   Original Report Authenticated By: Signa Kell, M.D.   Dg Chest Port 1 View  07/19/2013   *RADIOLOGY REPORT*  Clinical Data: Congestion  PORTABLE CHEST - 1 VIEW  Comparison: Prior radiograph from 09/01/2012  Findings: Enteric tube courses into the abdomen with nonvisualization of the tip and side hole.  Heart size is at the upper limits of normal, stable as compared to prior exam.  The hila are prominent bilaterally.  The lungs are mildly hypoinflated.  There is streaky and linear opacities within both lung bases, most consistent with atelectasis. A small left pleural effusion is present.  There is no overt pulmonary edema.  No pneumothorax.  Irregular biapical pleural thickening is noted.  Diffuse osteopenia is present.  No acute osseous abnormalities. Remote this healed left-sided rib fractures are again noted.  IMPRESSION:  Bibasilar atelectasis, left greater than right, with small left pleural effusion.  No overt pulmonary edema or infectious infiltrate identified.   Original Report Authenticated By: Rise Mu, M.D.   Dg Abd Portable 1v  07/19/2013   *RADIOLOGY REPORT*  Clinical Data: Small bowel obstruction.  Increased pain.  PORTABLE ABDOMEN - 1 VIEW  Comparison: CT abdomen and pelvis 07/17/2013.  Abdomen 07/14/2011.  Findings: Gas filled moderately distended mid abdominal small bowel loops are present with suggestion of fold thickening which could represent edema or infiltrative process.  Paucity of gas in the colon.  Enteric tube with tip in the left upper quadrant consistent with location in the mid stomach.  Sclerosis and degenerative changes in the lumbar spine with vertebral compression deformities noted.  This is stable.  Degenerative changes in the hips.  IMPRESSION: Mid abdominal small bowel distension consistent with obstruction with fold thickening consistent with edema, mural hemorrhage, or  inflammatory process.   Original Report Authenticated By: Burman Nieves, M.D.    Medications: Scheduled Meds: . sodium chloride   Intravenous Once  . antiseptic oral rinse  15 mL Mouth Rinse BID  . aspirin  300 mg Rectal Daily  . hydrocortisone sod succinate (SOLU-CORTEF) inj  25 mg Intravenous Q12H  . metoprolol  2.5 mg Intravenous Q6H  . mometasone-formoterol  2 puff Inhalation BID  . potassium chloride  10 mEq Intravenous Q1 Hr x 2  . sodium chloride  250 mL Intravenous Once  . sodium chloride  3 mL Intravenous Q12H  . [START ON 07/21/2013] triamcinolone cream  1 application Topical Weekly   Continuous Infusions: . amiodarone (NEXTERONE PREMIX) 360 mg/200 mL dextrose 60 mg/hr (07/19/13 0430)  . amiodarone (NEXTERONE PREMIX) 360 mg/200 mL dextrose    . dextrose 5 % and 0.9% NaCl 50 mL/hr at 07/19/13 0118  . heparin    . phenylephrine (NEO-SYNEPHRINE) Adult infusion     PRN Meds:.hydrALAZINE, morphine injection, ondansetron (ZOFRAN) IV    LOS: 2 days   Sorin Frimpong,CHRISTOPHER  [facility] Pager [phone]. If 8PM-8AM, please contact night-coverage at [email or web address], password [email or web address]  07/19/2013, 7:28 AM  LOS: 2 days

## 2013-07-19 NOTE — Significant Event (Signed)
Rapid Response Event Note  Overview: Time Called: 0305 Arrival Time: 0310 Event Type: Cardiac  Initial Focused Assessment: Pt alert and very pale. Admitted with bowel obstruction having Afib, SVT/VTACH --Bp labile with rate and rhythm changes. Pt remained alert throughout the episode and was transferred to ICU.   Interventions:EKG, NS BOLUS,LABS-- RECEIVED AMIODARONE UPON ADM. TO ICU PER DR DETERDING. DR T.TURNER ARRIVED AS PATIENT WAS ADMITTED TO 1234. THE FAMILY ARRIVED AS SHE TRANSFERRED   Event Summary: Name of Physician Notified: R.REIDLER PA at 0315  Name of Consulting Physician Notified: DR. Mayford Knife at    Outcome: Transferred (Comment)  Event End Time: 1610  Bayard Hugger

## 2013-07-19 NOTE — Plan of Care (Signed)
Problem: Consults Goal: Ventricular Arrhythmia Patient Education (See Patient Education module for education specifics.) Outcome: Progressing All family members updated on pain of care for Dysrhythmia.  Problem: Phase I Progression Outcomes Goal: Arrhythmia controlled or corrected Outcome: Progressing Pt converted to Sinus Rhythm w PAC's around 0800. Goal: Hemodynamically stable Outcome: Progressing Vitals stable since conversion to SR.  Continued close monitoring. Goal: Voiding-avoid urinary catheter unless indicated Outcome: Not Applicable Date Met:  07/19/13 Foley placed because pt unable to void, no UOP for 10 hrs.

## 2013-07-19 NOTE — Progress Notes (Signed)
Late Note: 07/19/13 @ 02:52 SVT reported from central monitoring, long run & sustaining. Found pt peacefully sleeping, awakened without symptoms or c/o of chest discomfort, or any difficulty breathing. Color pale, but no change from previous assessment. Skin warm & dry. Vitals done an EKG performed x 3 with first showing atrial flutter with RVR, then we captured 2 more EKG's showing SVT. PAC notified from [place] [facility] & Rapid Response called. Lab in to do am labs early, Daughter & son notified. Pt condition worsening with each SVT episode, becoming more SOB with each episode & having flushing of face, BP remains low. 1 liter of NS bolus also given, planned transport to ICU rm #[id number]-Dr Turner with [facility] in to see pt when moved to ICU, Family present. ICU accepting care of pt from now on.

## 2013-07-19 NOTE — Consult Note (Signed)
WOC consult Note Reason for Consult: evaluation of LE wounds. Pt followed by the wound care center for LE wounds. She had a area of trauma to the LLE -medially that she hit on the brick porch at her house several months ago. This has been followed by Dr. Jimmey Ralph.  She also has bilateral LE edema managed with compression stockings.  Her family will bring those in for use while in the hospital.  Wound type: currently she has no open wounds, she has a small pc of collagen on the medial aspect of her RLE but it is intact and I will leave in place. She has an intact bulla of the left dorsal foot that seems to be resolving.  Periwound: she does have some senile purpura on the LLE, and hemosiderin staining on the RLE.  Dressing procedure/placement/frequency: Protect the bulla on the left dorsal foot with foam, wrap with kerlix just to protect the leg for now. When the family brings the compression stockings from home, ok for them to place with soft protective stockings under the garments.   Discussed with bedside nurse.   Re consult if needed, will not follow at this time. Thanks  Wilmar Prabhakar [employer], CWOCN [phone])

## 2013-07-20 DIAGNOSIS — I472 Ventricular tachycardia: Secondary | ICD-10-CM | POA: Diagnosis not present

## 2013-07-20 DIAGNOSIS — I214 Non-ST elevation (NSTEMI) myocardial infarction: Secondary | ICD-10-CM | POA: Diagnosis not present

## 2013-07-20 LAB — COMPREHENSIVE METABOLIC PANEL
ALT: 16 U/L (ref 0–35)
AST: 18 U/L (ref 0–37)
Albumin: 2.7 g/dL — ABNORMAL LOW (ref 3.5–5.2)
CO2: 28 mEq/L (ref 19–32)
Calcium: 8.6 mg/dL (ref 8.4–10.5)
Chloride: 106 mEq/L (ref 96–112)
Creatinine, Ser: 0.84 mg/dL (ref 0.50–1.10)
Sodium: 142 mEq/L (ref 135–145)
Total Bilirubin: 0.1 mg/dL — ABNORMAL LOW (ref 0.3–1.2)

## 2013-07-20 LAB — CBC
MCV: 80.2 fL (ref 78.0–100.0)
Platelets: 353 10*3/uL (ref 150–400)
RBC: 3.93 MIL/uL (ref 3.87–5.11)
RDW: 17.5 % — ABNORMAL HIGH (ref 11.5–15.5)
WBC: 3.6 10*3/uL — ABNORMAL LOW (ref 4.0–10.5)

## 2013-07-20 LAB — HEPARIN LEVEL (UNFRACTIONATED)
Heparin Unfractionated: 0.21 IU/mL — ABNORMAL LOW (ref 0.30–0.70)
Heparin Unfractionated: 0.36 IU/mL (ref 0.30–0.70)

## 2013-07-20 MED ORDER — HEPARIN (PORCINE) IN NACL 100-0.45 UNIT/ML-% IJ SOLN
750.0000 [IU]/h | INTRAMUSCULAR | Status: DC
  Administered 2013-07-20 – 2013-07-22 (×3): 750 [IU]/h via INTRAVENOUS
  Filled 2013-07-20 (×3): qty 250

## 2013-07-20 MED ORDER — POTASSIUM CHLORIDE 10 MEQ/100ML IV SOLN
10.0000 meq | INTRAVENOUS | Status: AC
  Administered 2013-07-20 (×2): 10 meq via INTRAVENOUS
  Filled 2013-07-20: qty 200

## 2013-07-20 MED ORDER — BISACODYL 10 MG RE SUPP
10.0000 mg | Freq: Every day | RECTAL | Status: DC | PRN
  Administered 2013-07-20 – 2013-07-22 (×2): 10 mg via RECTAL
  Filled 2013-07-20 (×2): qty 1

## 2013-07-20 NOTE — Progress Notes (Signed)
[facility] PROGRESS NOTE  Ariel Hunt WNU:[id number] DOB: 06/08/1923 DOA: 07/17/2013 PCP: Daisy Floro, MD  Assessment/Plan: Principal Problem:   Small bowel obstruction Active Problems:   COLLAGENOUS COLITIS   Hypertension   CAD (coronary artery disease)   Hyperlipidemia   Anemia   Heart murmur, systolic   DNR (do not resuscitate)   Aortic stenosis, severe   Arrhythmia    1. SBO:  Patient presented with 2 days of progressive abdominal distention/pain, culminating in obstipation on 07/17/13. CT abdomen/Pelvis confirmed recurrent SBO. Managing with NG-T suction, NPO, iv fluids and analgesics. Dr Ovidio Kin provided surgical consultation, and has recommended watchful waiting and conservative management for now. Surgery would be considered very high risk, so hoping to avoid. Managing as recommended.  2. Anemia: Patient has a history of chronic anemia. Her last HB in 04/2012 was 11.0, dropped to about 10.0 in 09/2012, and on 07/17/13, it was 7.6, MCV 75. Etiology is unclear. Anemia panel ordered. Transfused 2 units of PRBCs, with satisfactory bump in HB to 9.6. HB has remained stable/satisfactory.  3. Aortic stenosis: Patient has known history of moderate aortic stenosis, per 2D Echocardiogram about a year ago. Repeat 2D Echocardiogram of 07/18/13, showed normal LV cavity size, normal systolic function, and no regional wall motion abnormalities. There was aortic valve severe stenosis. Valve area: 0.8cm^2(VTI). Valve area: 0.79cm^2 (Vmax), moderate mitral regurgitation, moderately dilated LA and RA, as well as PA peak pressure: 45mm Hg (S). 4. Arrhythmia/Hypotension: Overnight on 07/18/13, patient developed tachyarrhythmias, with V. Tach, SVT and A,fib with RVR. Dr Armanda Magic provided cardiology consultation, patient was managed with Amiodarone bolus, iv Lopressor and Amiodarone infusion. Troponin I bumped to 0.43. In AM of 07/19/13, patient was initially in fast A. Fib, but then  reverted to SR, with HR in the 80s. Systolic BP 70-83. Bolused 250 ml NS, and after discussion with family, commenced trial of NeoSynephrine, with family accepting of the fact that if this failed, no further escalation would be attempted. Fortunately, patient responded rather dramatically, with resolution of hypotension. Will wean NeoSynephrine today.  5. History of colitis:  Patient has known history of Collagenous colitis, on Entocort. This appears clinically stable at this time. On stress doses of steroids. 6. CAD: Known history of CAD, S/P PCI/Stent. As described above, now has bump in CEs. CEs have leveled off. Patient likely had demand ischemia.  7. HTN: See discussion in #4 above.  8. GERD: Asymptomatic. 9. LE wounds: Patient has chronic healing LLE wound, possibly associated with venous insufficiency. Regularly follows up at the wound care clinic. WOC consulted and recommendations implemented.  10. Protein-Calorie malnutrition: Clinically, patient appears severely malnourished. Will need nutritionist input in due course.    Code Status: DNR. Family Communication:  Disposition Plan: To be determined.   Brief narrative: 79 y.o. female with hx of mild to moderate aortic stenosis, last ECHO about a year ago, hx of CAD with cardiac stent, prior SBO felt to be secondary to adhesions, resolved with conservative treatments several times, hx of HTN, dyslipidemia, GERD, asthma, anemia of unclear etiology, presenting to the ER with increased abdominal pain and distention for 2 days. Her last BM was on 07/16/13 and it was normal. She denied hemorrhoidal bleed, bloody diarrhea, epistasis, or any other obvious source of bleeding. Her last Hb was about a year ago (May 2013) and it was 11 grams/DL, then in Oct 2013, it dropped to about 10 grams, and on 07/17/13, it was 7.6 grams per DL, with  MCV 75 and normal Cr. CT Abdomen/Pelvis showed SBO with transitional point in the small bowel, query luminal narrowing.  She has normal WBC and the rest of her chemistries were unremarkable. Her last colonoscopy was in 2009, when she was dx with collagenous colitis. She is on very low dose of Entecort. She also has compressive Fx and has been taking taking pain medication regularly. [name] consulted surgery and hospitalist was asked to admit her for SBO and anemia.   Consultants:  Dr Ovidio Kin, surgeon.   Dr Armanda Magic, cardiologist.  Procedures:  CT Abdomen/Pelvis.   CXRs.   AXRs.   Antibiotics:  N/A.   HPI/Subjective: Feels quite well today, No new issues. .   Objective: Vital signs in last 24 hours: Temp:  [97.8 F (36.6 C)-98.3 F (36.8 C)] 98.3 F (36.8 C) (07/22 0800) Pulse Rate:  [57-77] 60 (07/22 0800) Resp:  [12-20] 14 (07/22 0800) BP: (98-149)/(59-91) 142/74 mmHg (07/22 0800) SpO2:  [96 %-100 %] 100 % (07/22 0800) Weight:  [40.6 kg (89 lb 8.1 oz)] 40.6 kg (89 lb 8.1 oz) (07/22 0500) Weight change:  Last BM Date: 07/17/13  Intake/Output from previous day: 07/21 0701 - 07/22 0700 In: 2102.2 [P.O.:240; I.V.:1762.2; IV Piggyback:100] Out: 1680 [Urine:480; Emesis/NG output:1200] Total I/O In: 221 [I.V.:121; IV Piggyback:100] Out: -    Physical Exam: General: Alert, communicative, not short of breath at rest. NG-T is in situ, and draining. Looks undernourished.  HEENT:  Mild clinical pallor, no jaundice, no conjunctival injection or discharge. Hydration is fair.  NECK:  Supple, JVP not seen, no carotid bruits, no palpable lymphadenopathy, no palpable goiter. CHEST:  Clinically clear to auscultation. HEART:  Sounds 1 and 2 heard, normal, regular, no murmurs. ABDOMEN:  Still moderately distended, soft, non-tender, no palpable organomegaly, no palpable masses, very scanty bowel sounds. GENITALIA:  Not examined. LOWER EXTREMITIES:  No pitting edema, palpable peripheral pulses. Has dressings on LLE.  MUSCULOSKELETAL SYSTEM:  Generalized osteoarthritic changes and dorsal  kyphosis, otherwise, normal. CENTRAL NERVOUS SYSTEM:  No focal neurologic deficit on gross examination.  Lab Results:  Recent Labs  07/19/13 0325 07/20/13 0335  WBC 5.0 3.6*  HGB 10.1* 9.9*  HCT 31.5* 31.5*  PLT 351 353    Recent Labs  07/19/13 0325 07/20/13 0335  NA 140 142  K 3.5 3.1*  CL 103 106  CO2 29 28  GLUCOSE 162* 115*  BUN 15 15  CREATININE 0.78 0.84  CALCIUM 8.8 8.6   Recent Results (from the past 240 hour(s))  MRSA PCR SCREENING     Status: None   Collection Time    07/19/13 11:47 AM      Result Value Range Status   MRSA by PCR NEGATIVE  NEGATIVE Final   Comment:            The GeneXpert MRSA Assay (FDA     approved for NASAL specimens     only), is one component of a     comprehensive MRSA colonization     surveillance program. It is not     intended to diagnose MRSA     infection nor to guide or     monitor treatment for     MRSA infections.     Studies/Results: Dg Chest Port 1 View  07/19/2013   *RADIOLOGY REPORT*  Clinical Data: Congestion  PORTABLE CHEST - 1 VIEW  Comparison: Prior radiograph from 09/01/2012  Findings: Enteric tube courses into the abdomen with nonvisualization of the tip and  side hole.  Heart size is at the upper limits of normal, stable as compared to prior exam.  The hila are prominent bilaterally.  The lungs are mildly hypoinflated.  There is streaky and linear opacities within both lung bases, most consistent with atelectasis. A small left pleural effusion is present.  There is no overt pulmonary edema.  No pneumothorax.  Irregular biapical pleural thickening is noted.  Diffuse osteopenia is present.  No acute osseous abnormalities. Remote this healed left-sided rib fractures are again noted.  IMPRESSION:  Bibasilar atelectasis, left greater than right, with small left pleural effusion.  No overt pulmonary edema or infectious infiltrate identified.   Original Report Authenticated By: Rise Mu, M.D.   Dg Abd Portable  1v  07/19/2013   *RADIOLOGY REPORT*  Clinical Data: Small bowel obstruction.  Increased pain.  PORTABLE ABDOMEN - 1 VIEW  Comparison: CT abdomen and pelvis 07/17/2013.  Abdomen 07/14/2011.  Findings: Gas filled moderately distended mid abdominal small bowel loops are present with suggestion of fold thickening which could represent edema or infiltrative process.  Paucity of gas in the colon.  Enteric tube with tip in the left upper quadrant consistent with location in the mid stomach.  Sclerosis and degenerative changes in the lumbar spine with vertebral compression deformities noted.  This is stable.  Degenerative changes in the hips.  IMPRESSION: Mid abdominal small bowel distension consistent with obstruction with fold thickening consistent with edema, mural hemorrhage, or inflammatory process.   Original Report Authenticated By: Burman Nieves, M.D.    Medications: Scheduled Meds: . sodium chloride   Intravenous Once  . antiseptic oral rinse  15 mL Mouth Rinse BID  . aspirin  300 mg Rectal Daily  . hydrocortisone sod succinate (SOLU-CORTEF) inj  25 mg Intravenous Q12H  . metoprolol  2.5 mg Intravenous Q6H  . mometasone-formoterol  2 puff Inhalation BID  . potassium chloride  10 mEq Intravenous Q1 Hr x 2  . sodium chloride  3 mL Intravenous Q12H  . [START ON 07/21/2013] triamcinolone cream  1 application Topical Weekly   Continuous Infusions: . amiodarone (NEXTERONE PREMIX) 360 mg/200 mL dextrose 30 mg/hr (07/20/13 0641)  . dextrose 5 % and 0.9% NaCl 50 mL (07/20/13 0018)  . heparin 600 Units/hr (07/19/13 2300)  . phenylephrine (NEO-SYNEPHRINE) Adult infusion     PRN Meds:.diphenhydrAMINE, hydrALAZINE, morphine injection, ondansetron (ZOFRAN) IV    LOS: 3 days   Keayra Graham,CHRISTOPHER  [facility] Pager [phone]. If 8PM-8AM, please contact night-coverage at [email or web address], password [facility] 07/20/2013, 8:40 AM  LOS: 3 days

## 2013-07-20 NOTE — Progress Notes (Signed)
Patient ID: Ariel Hunt, female   DOB: Aug 11, 1923, 79 y.o.   MRN: [id number] Small bowel obstruction  Subjective:  Bad night due to going to bed late, denies abd pain, n/v. belching but no flatus/bm, very dry mouth   Objective: Vital signs in last 24 hours: Temp:  [97.7 F (36.5 C)-98.2 F (36.8 C)] 98 F (36.7 C) (07/22 0400) Pulse Rate:  [58-77] 58 (07/22 0600) Resp:  [12-20] 14 (07/22 0600) BP: (98-149)/(59-91) 134/71 mmHg (07/22 0600) SpO2:  [96 %-100 %] 100 % (07/22 0600) Weight:  [89 lb 8.1 oz (40.6 kg)] 89 lb 8.1 oz (40.6 kg) (07/22 0500) Last BM Date: 07/17/13  Intake/Output from previous day: 07/21 0701 - 07/22 0700 In: 2090.9 [P.O.:240; I.V.:1750.9; IV Piggyback:100] Out: 1680 [Urine:480; Emesis/NG output:1200] Intake/Output this shift:    General appearance: alert and cooperative GI: distended, non-tender, faint bowel sounds NG with bilious output 1200cc/24hrs  Lab Results:  Results for orders placed during the hospital encounter of 07/17/13 (from the past 24 hour(s))  TROPONIN I     Status: Abnormal   Collection Time    07/19/13  9:40 AM      Result Value Range   Troponin I 0.43 (*) <0.30 ng/mL  MRSA PCR SCREENING     Status: None   Collection Time    07/19/13 11:47 AM      Result Value Range   MRSA by PCR NEGATIVE  NEGATIVE  TROPONIN I     Status: Abnormal   Collection Time    07/19/13  3:56 PM      Result Value Range   Troponin I 0.34 (*) <0.30 ng/mL  HEPARIN LEVEL (UNFRACTIONATED)     Status: Abnormal   Collection Time    07/19/13  8:10 PM      Result Value Range   Heparin Unfractionated <0.10 (*) 0.30 - 0.70 IU/mL  TROPONIN I     Status: Abnormal   Collection Time    07/19/13 10:09 PM      Result Value Range   Troponin I 0.40 (*) <0.30 ng/mL  CBC     Status: Abnormal   Collection Time    07/20/13  3:35 AM      Result Value Range   WBC 3.6 (*) 4.0 - 10.5 K/uL   RBC 3.93  3.87 - 5.11 MIL/uL   Hemoglobin 9.9 (*) 12.0 - 15.0 g/dL   HCT 40.9  (*) 81.1 - 46.0 %   MCV 80.2  78.0 - 100.0 fL   MCH 25.2 (*) 26.0 - 34.0 pg   MCHC 31.4  30.0 - 36.0 g/dL   RDW 91.4 (*) 78.2 - 95.6 %   Platelets 353  150 - 400 K/uL  COMPREHENSIVE METABOLIC PANEL     Status: Abnormal   Collection Time    07/20/13  3:35 AM      Result Value Range   Sodium 142  135 - 145 mEq/L   Potassium 3.1 (*) 3.5 - 5.1 mEq/L   Chloride 106  96 - 112 mEq/L   CO2 28  19 - 32 mEq/L   Glucose, Bld 115 (*) 70 - 99 mg/dL   BUN 15  6 - 23 mg/dL   Creatinine, Ser 2.13  0.50 - 1.10 mg/dL   Calcium 8.6  8.4 - 08.6 mg/dL   Total Protein 5.5 (*) 6.0 - 8.3 g/dL   Albumin 2.7 (*) 3.5 - 5.2 g/dL   AST 18  0 - 37 U/L   ALT  16  0 - 35 U/L   Alkaline Phosphatase 37 (*) 39 - 117 U/L   Total Bilirubin 0.1 (*) 0.3 - 1.2 mg/dL   GFR calc non Af Amer 61 (*) >90 mL/min   GFR calc Af Amer 70 (*) >90 mL/min  MAGNESIUM     Status: None   Collection Time    07/20/13  3:35 AM      Result Value Range   Magnesium 2.3  1.5 - 2.5 mg/dL     MEDS, Scheduled . sodium chloride   Intravenous Once  . antiseptic oral rinse  15 mL Mouth Rinse BID  . aspirin  300 mg Rectal Daily  . hydrocortisone sod succinate (SOLU-CORTEF) inj  25 mg Intravenous Q12H  . metoprolol  2.5 mg Intravenous Q6H  . mometasone-formoterol  2 puff Inhalation BID  . sodium chloride  3 mL Intravenous Q12H  . [START ON 07/21/2013] triamcinolone cream  1 application Topical Weekly     Assessment: Small bowel obstruction Anemia  Plan: No changes this am Continue NG, but will allow some ice chips and popsicles Can try clamping trial once having some flatus Ambulate and OOB as tolerated Will follow for now, would like to avoid surgery if possible     LOS: 3 days   WHITE, [facility], [name] [phone]   07/20/2013 7:55 AM

## 2013-07-20 NOTE — Progress Notes (Signed)
INITIAL NUTRITION ASSESSMENT  DOCUMENTATION CODES Per approved criteria  -Underweight   INTERVENTION: Diet advancement per MD discretion RD to provide supplements as needed once diet is advanced If suspected that pt will remain NPO > 7 days recommend initiating TPN.  NUTRITION DIAGNOSIS: Inadequate oral intake related to inability to eat as evidenced by NPO status.   Goal: Pt to meet >/= 90% of their estimated nutrition needs   Monitor:  Bowel Function Diet advancement/PO intake Weight Labs  Reason for Assessment: Low BMI  79 y.o. female  Admitting Dx: Small bowel obstruction  ASSESSMENT: 79 y.o. female with hx of mild to moderate aortic stenosis, last ECHO about a year ago, hx of CAD with cardiac stent, prior SBO felt to be because of adhesions, resolved with conservative treatments several times, hx of HTN, GERD, asthma, anemia of unclear etiology, presents to the ER with increase abdominal pain and distention for 2 days.  Pt reports that she usually has a good appetite and eats well, consuming 3 meals daily and eating a variety of foods every day. Pt states she usually weighs between 94 and 97 lbs and has never weighed more than 104 lbs. Per pt's son, pt was drinking Boost for a while but, tired of it and now drinks a little Gatorade every other day instead. Son also reports that pt has history of SBO which typically causes her to lose weight but, she has always regained the weight in the past after SBO resolves. Pt states her mouth is dry and she wants to have a popsicle.  Height: Ht Readings from Last 1 Encounters:  07/18/13 5\' 4"  (1.626 m)    Weight: Wt Readings from Last 1 Encounters:  07/20/13 89 lb 8.1 oz (40.6 kg)    Ideal Body Weight: 120 lbs  % Ideal Body Weight: 74%  Wt Readings from Last 10 Encounters:  07/20/13 89 lb 8.1 oz (40.6 kg)  10/28/12 91 lb (41.277 kg)  02/10/12 94 lb 6.4 oz (42.82 kg)  10/14/11 87 lb 9.6 oz (39.735 kg)  08/13/11 86 lb  (39.009 kg)  07/09/11 84 lb 9.6 oz (38.374 kg)  03/11/11 87 lb 3.2 oz (39.554 kg)  02/19/10 98 lb (44.453 kg)  05/12/09 91 lb (41.277 kg)  11/15/08 95 lb 8 oz (43.319 kg)    Usual Body Weight: 94-97 lbs  % Usual Body Weight: 95%  BMI:  Body mass index is 15.36 kg/(m^2).  Estimated Nutritional Needs: Kcal: 1300-1500 Protein: 50-60 grams Fluid: > 1.2 L  Skin: abrasions on right and left legs  Diet Order: NPO  EDUCATION NEEDS: -No education needs identified at this time   Intake/Output Summary (Last 24 hours) at 07/20/13 1452 Last data filed at 07/20/13 1100  Gross per 24 hour  Intake 1853.62 ml  Output   2105 ml  Net -251.38 ml    Last BM: 7/19  Labs:   Recent Labs Lab 07/17/13 1400 07/19/13 0325 07/20/13 0335  NA 137 140 142  K 3.7 3.5 3.1*  CL 96 103 106  CO2 27 29 28   BUN 29* 15 15  CREATININE 1.11* 0.78 0.84  CALCIUM 10.0 8.8 8.6  MG  --  2.3 2.3  GLUCOSE 129* 162* 115*    CBG (last 3)  No results found for this basename: GLUCAP,  in the last 72 hours  Scheduled Meds: . sodium chloride   Intravenous Once  . antiseptic oral rinse  15 mL Mouth Rinse BID  . aspirin  300 mg  Rectal Daily  . hydrocortisone sod succinate (SOLU-CORTEF) inj  25 mg Intravenous Q12H  . metoprolol  2.5 mg Intravenous Q6H  . mometasone-formoterol  2 puff Inhalation BID  . sodium chloride  3 mL Intravenous Q12H  . [START ON 07/21/2013] triamcinolone cream  1 application Topical Weekly    Continuous Infusions: . amiodarone (NEXTERONE PREMIX) 360 mg/200 mL dextrose 30 mg/hr (07/20/13 0641)  . dextrose 5 % and 0.9% NaCl 50 mL (07/20/13 0018)  . heparin 750 Units/hr (07/20/13 1339)    Past Medical History  Diagnosis Date  . Colitis   . Weight loss   . Diarrhea   . CAD (coronary artery disease)   . Hyperlipidemia   . Hypertension   . Asthma   . GERD (gastroesophageal reflux disease)     Past Surgical History  Procedure Laterality Date  . Hernia repair       inguinal  . Coronary angioplasty with stent placement    . Bowel adhesions      several    Ian Malkin RD, LDN Inpatient Clinical Dietitian Pager: [phone] After Hours Pager: [phone]

## 2013-07-20 NOTE — Progress Notes (Signed)
SUBJECTIVE:  Now converted to NSR. Some mild abdominal pain this am  OBJECTIVE:   Vitals:   Filed Vitals:   07/20/13 0600 07/20/13 0700 07/20/13 0800 07/20/13 0920  BP: 134/71 127/63 142/74   Pulse: 58 57 60   Temp:   98.3 F (36.8 C)   TempSrc:   Oral   Resp: 14 15 14    Height:      Weight:      SpO2: 100% 99% 100% 100%   I&O's:   Intake/Output Summary (Last 24 hours) at 07/20/13 1040 Last data filed at 07/20/13 1000  Gross per 24 hour  Intake 2020.32 ml  Output   2280 ml  Net -259.68 ml   TELEMETRY: Reviewed telemetry pt in NSR:     PHYSICAL EXAM General: Well developed, well nourished, in no acute distress Head: Eyes PERRLA, No xanthomas.   Normal cephalic and atramatic  Lungs:   Clear bilaterally to auscultation and percussion. Heart:   HRRR S1 S2 Pulses are 2+ & equal. Abdomen: abdomen distended Extremities:   No clubbing, cyanosis or edema.  DP +1 Neuro: Alert and oriented X 3. Psych:  Good affect, responds appropriately   LABS: Basic Metabolic Panel:  Recent Labs  78/29/56 0325 07/20/13 0335  NA 140 142  K 3.5 3.1*  CL 103 106  CO2 29 28  GLUCOSE 162* 115*  BUN 15 15  CREATININE 0.78 0.84  CALCIUM 8.8 8.6  MG 2.3 2.3   Liver Function Tests:  Recent Labs  07/19/13 0325 07/20/13 0335  AST 14 18  ALT 9 16  ALKPHOS 31* 37*  BILITOT 0.3 0.1*  PROT 5.6* 5.5*  ALBUMIN 2.7* 2.7*   No results found for this basename: LIPASE, AMYLASE,  in the last 72 hours CBC:  Recent Labs  07/17/13 1400  07/19/13 0325 07/20/13 0335  WBC 8.5  --  5.0 3.6*  NEUTROABS 7.6  --   --   --   HGB 7.6*  < > 10.1* 9.9*  HCT 24.1*  < > 31.5* 31.5*  MCV 75.5*  --  79.7 80.2  PLT 431*  --  351 353  < > = values in this interval not displayed. Cardiac Enzymes:  Recent Labs  07/19/13 0940 07/19/13 1556 07/19/13 2209  TROPONINI 0.43* 0.34* 0.40*   Thyroid Function Tests:  Recent Labs  07/18/13 0703  TSH 0.592   Anemia Panel:  Recent Labs   07/18/13 1038  VITAMINB12 851  FOLATE >20.0  FERRITIN 13  TIBC 374  IRON 65  RETICCTPCT 1.6   Coag Panel:   Lab Results  Component Value Date   INR 1.03 05/08/2012    RADIOLOGY: Ct Abdomen Pelvis Wo Contrast  07/17/2013   *RADIOLOGY REPORT*  Clinical Data:  Vomiting  CT ABDOMEN AND PELVIS WITHOUT CONTRAST  Technique:  Multidetector CT imaging of the abdomen and pelvis was performed following the standard protocol without intravenous contrast.  Comparison: 07/11/2011  Findings:  Scar versus atelectasis is noted in the lung bases.  There is no pleural effusion identified.  There is no focal liver abnormality identified.  There are multiple calcified granulomas identified within the spleen.  Gallbladder is unremarkable.  No significant biliary dilatation.  Normal appearance of the pancreas.  The adrenal glands are both within normal limits.    The left kidney appears normal.  The right kidney is normal.  Urinary bladder is unremarkable.  Normal appearance of the uterus for patient's age.  There is calcified atherosclerotic disease  affecting the abdominal aorta.  Aneurysmal dilatation of the infrarenal abdominal aorta is noted which measures up to 3.1 cm.  The stomach appears normal.  There is abnormal small bowel dilatation. The small bowel loops measure up to 3.82 cm.  The distal small bowel loops appear collapsed.  Transition point is in the difficult to identify due to lack of adequate oral contrast material.  There is moderate stool identified within the colon.  The distal colon and rectum appear to be decreased in caliber.  Review of the visualized osseous structures is significant for a marked scoliosis deformity.  There is multilevel degenerative disc disease identified throughout the lumbar spine.  Compression deformities are identified at T11 and T12 which are age indeterminate.  IMPRESSION:  1.  Examination is positive for a small bowel obstruction. Transition point is likely within the central  small bowel mesentery. 2.  No evidence for bowel perforation. 3.  Scoliosis and multilevel spondylosis.  There are compression fractures at T11 and T12.   Original Report Authenticated By: Signa Kell, M.D.   Dg Chest Port 1 View  07/19/2013   *RADIOLOGY REPORT*  Clinical Data: Congestion  PORTABLE CHEST - 1 VIEW  Comparison: Prior radiograph from 09/01/2012  Findings: Enteric tube courses into the abdomen with nonvisualization of the tip and side hole.  Heart size is at the upper limits of normal, stable as compared to prior exam.  The hila are prominent bilaterally.  The lungs are mildly hypoinflated.  There is streaky and linear opacities within both lung bases, most consistent with atelectasis. A small left pleural effusion is present.  There is no overt pulmonary edema.  No pneumothorax.  Irregular biapical pleural thickening is noted.  Diffuse osteopenia is present.  No acute osseous abnormalities. Remote this healed left-sided rib fractures are again noted.  IMPRESSION:  Bibasilar atelectasis, left greater than right, with small left pleural effusion.  No overt pulmonary edema or infectious infiltrate identified.   Original Report Authenticated By: Rise Mu, M.D.   Dg Abd Portable 1v  07/19/2013   *RADIOLOGY REPORT*  Clinical Data: Small bowel obstruction.  Increased pain.  PORTABLE ABDOMEN - 1 VIEW  Comparison: CT abdomen and pelvis 07/17/2013.  Abdomen 07/14/2011.  Findings: Gas filled moderately distended mid abdominal small bowel loops are present with suggestion of fold thickening which could represent edema or infiltrative process.  Paucity of gas in the colon.  Enteric tube with tip in the left upper quadrant consistent with location in the mid stomach.  Sclerosis and degenerative changes in the lumbar spine with vertebral compression deformities noted.  This is stable.  Degenerative changes in the hips.  IMPRESSION: Mid abdominal small bowel distension consistent with obstruction  with fold thickening consistent with edema, mural hemorrhage, or inflammatory process.   Original Report Authenticated By: Burman Nieves, M.D.    ASSESSMENT:  1. Ventricular tachycardia - resolved on IV Amiodarone. Her LVF is normal on recent echo.  2. Atrial fibrillation with RVR now in NSR on IV amio 3. Hypokalemia which may be contributing to afib  4. CAD s/p remote NSTEMI with PCI of OM 2002 with nonobstructive disease elsewhere.  5. SBO - medical management at this time due to unstable cardiac arrhythmias  6. HTN  7. Moderate AS by recent echo with normal LVF  8.  NSTEMI most likely demand ischemia from afib with RVR but VT worrisome for progression of CAD PLAN:  1. Continue IV Amio gtt  2. Replete potassium  3. IV  Lopressor until taking PO 4. ASA per rectum/IV Heparin gtt     Quintella Reichert, MD  07/20/2013  10:40 AM

## 2013-07-20 NOTE — Progress Notes (Addendum)
ANTICOAGULATION CONSULT NOTE - Follow Up Consult  Pharmacy Consult for Heparin Indication: NSTEMI, Afib  No Known Allergies  Patient Measurements: Height: 5\' 4"  (162.6 cm) Weight: 89 lb 8.1 oz (40.6 kg) IBW/kg (Calculated) : 54.7 Heparin Dosing Weight: 40.6  Vital Signs: Temp: 98.3 F (36.8 C) (07/22 0800) Temp src: Oral (07/22 0800) BP: 155/81 mmHg (07/22 1110) Pulse Rate: 65 (07/22 1100)  Labs:  Recent Labs  07/17/13 1400 07/18/13 0703  07/19/13 0325 07/19/13 0940 07/19/13 1556 07/19/13 2010 07/19/13 2209 07/20/13 0335 07/20/13 1200  HGB 7.6* 9.6*  --  10.1*  --   --   --   --  9.9*  --   HCT 24.1* 29.8*  --  31.5*  --   --   --   --  31.5*  --   PLT 431*  --   --  351  --   --   --   --  353  --   HEPARINUNFRC  --   --   --   --   --   --  <0.10*  --   --  0.21*  CREATININE 1.11*  --   --  0.78  --   --   --   --  0.84  --   TROPONINI  --   --   < > 0.43* 0.43* 0.34*  --  0.40*  --   --   < > = values in this interval not displayed.  Estimated Creatinine Clearance: 30.2 ml/min (by C-G formula based on Cr of 0.84).   Medications:  Scheduled:  . sodium chloride   Intravenous Once  . antiseptic oral rinse  15 mL Mouth Rinse BID  . aspirin  300 mg Rectal Daily  . hydrocortisone sod succinate (SOLU-CORTEF) inj  25 mg Intravenous Q12H  . metoprolol  2.5 mg Intravenous Q6H  . mometasone-formoterol  2 puff Inhalation BID  . sodium chloride  3 mL Intravenous Q12H  . [START ON 07/21/2013] triamcinolone cream  1 application Topical Weekly   Infusions:  . amiodarone (NEXTERONE PREMIX) 360 mg/200 mL dextrose 30 mg/hr (07/20/13 0641)  . dextrose 5 % and 0.9% NaCl 50 mL (07/20/13 0018)  . heparin 600 Units/hr (07/19/13 2300)    Assessment: 79 year old female admitted 7/19 with small bowel obstruction. Transferred to ICU late 7/20 pm with NSTEMI and new Afib/RVR and started on IV heparin.   Heparin level subtherapeutic (0.21) on 600 units/hr  CBC stable, no  bleeding/complications reported.  Also on daily aspirin rectally and amiodarone drip  Follow up if planning oral anticoagulation once able to take POs  Goal of Therapy:  Heparin levels 0.3-0.7 Monitor platelets by anticoagulation protocol: Yes   Plan:   Increase heparin 750 units/hr  Recheck level in 6hrs  Loralee Pacas, PharmD, BCPS Pager: [phone] 07/20/2013,12:30 PM

## 2013-07-20 NOTE — Plan of Care (Signed)
Problem: Phase II Progression Outcomes Goal: Progress activity as tolerated unless otherwise ordered Outcome: Progressing Amb. in halls x 2 today

## 2013-07-20 NOTE — Progress Notes (Signed)
ANTICOAGULATION CONSULT NOTE - Follow Up Consult  Pharmacy Consult for Heparin Indication: NSTEMI, Afib  No Known Allergies  Patient Measurements: Height: 5\' 4"  (162.6 cm) Weight: 89 lb 8.1 oz (40.6 kg) IBW/kg (Calculated) : 54.7 Heparin Dosing Weight: 40.6kg  Vital Signs: Temp: 98.3 F (36.8 C) (07/22 2000) Temp src: Oral (07/22 2000) BP: 154/81 mmHg (07/22 1954) Pulse Rate: 65 (07/22 1954)  Labs:  Recent Labs  07/18/13 0703  07/19/13 0325 07/19/13 0940 07/19/13 1556 07/19/13 2010 07/19/13 2209 07/20/13 0335 07/20/13 1200 07/20/13 2019  HGB 9.6*  --  10.1*  --   --   --   --  9.9*  --   --   HCT 29.8*  --  31.5*  --   --   --   --  31.5*  --   --   PLT  --   --  351  --   --   --   --  353  --   --   HEPARINUNFRC  --   --   --   --   --  <0.10*  --   --  0.21* 0.36  CREATININE  --   --  0.78  --   --   --   --  0.84  --   --   TROPONINI  --   < > 0.43* 0.43* 0.34*  --  0.40*  --   --   --   < > = values in this interval not displayed.  Estimated Creatinine Clearance: 30.2 ml/min (by C-G formula based on Cr of 0.84).   Medications:  Scheduled:  . sodium chloride   Intravenous Once  . antiseptic oral rinse  15 mL Mouth Rinse BID  . aspirin  300 mg Rectal Daily  . hydrocortisone sod succinate (SOLU-CORTEF) inj  25 mg Intravenous Q12H  . metoprolol  2.5 mg Intravenous Q6H  . mometasone-formoterol  2 puff Inhalation BID  . sodium chloride  3 mL Intravenous Q12H  . [START ON 07/21/2013] triamcinolone cream  1 application Topical Weekly   Infusions:  . amiodarone (NEXTERONE PREMIX) 360 mg/200 mL dextrose 30 mg/hr (07/20/13 2000)  . dextrose 5 % and 0.9% NaCl 50 mL (07/20/13 0018)  . heparin 750 Units/hr (07/20/13 2000)    Assessment: 79 year old female admitted 7/19 with small bowel obstruction. Transferred to ICU late 7/20 pm with NSTEMI and new Afib/RVR and started on IV heparin.   Heparin level is now therapeutic (0.36)  Follow up if planning oral  anticoagulation once able to take POs  No bleeding events reported in chart notes  Goal of Therapy:  Heparin levels 0.3-0.7 Monitor platelets by anticoagulation protocol: Yes   Plan:   Continue heparin 750 units/hr  Recheck level in 6hrs to confirm therapeutic dose  Darrol Angel, PharmD Pager: [phone] 07/20/2013,8:58 PM

## 2013-07-21 ENCOUNTER — Encounter (HOSPITAL_COMMUNITY): Payer: Self-pay | Admitting: Interventional Cardiology

## 2013-07-21 ENCOUNTER — Inpatient Hospital Stay (HOSPITAL_COMMUNITY): Payer: Medicare Other

## 2013-07-21 DIAGNOSIS — I4891 Unspecified atrial fibrillation: Secondary | ICD-10-CM | POA: Insufficient documentation

## 2013-07-21 LAB — COMPREHENSIVE METABOLIC PANEL
BUN: 17 mg/dL (ref 6–23)
CO2: 29 mEq/L (ref 19–32)
Calcium: 8.7 mg/dL (ref 8.4–10.5)
Creatinine, Ser: 0.78 mg/dL (ref 0.50–1.10)
GFR calc Af Amer: 85 mL/min — ABNORMAL LOW (ref >90)
GFR calc non Af Amer: 73 mL/min — ABNORMAL LOW (ref >90)
Glucose, Bld: 117 mg/dL — ABNORMAL HIGH (ref 70–99)

## 2013-07-21 LAB — CBC
HCT: 32.1 % — ABNORMAL LOW (ref 36.0–46.0)
Hemoglobin: 10.1 g/dL — ABNORMAL LOW (ref 12.0–15.0)
RBC: 3.99 MIL/uL (ref 3.87–5.11)

## 2013-07-21 LAB — POTASSIUM: Potassium: 3.4 mEq/L — ABNORMAL LOW (ref 3.5–5.1)

## 2013-07-21 MED ORDER — POTASSIUM CHLORIDE 10 MEQ/100ML IV SOLN
10.0000 meq | INTRAVENOUS | Status: AC
  Administered 2013-07-21 (×4): 10 meq via INTRAVENOUS
  Filled 2013-07-21: qty 400

## 2013-07-21 NOTE — Progress Notes (Signed)
[facility] PROGRESS NOTE  Ariel Hunt GMW:[id number] DOB: 1923-03-12 DOA: 07/17/2013 PCP: Daisy Floro, MD  Assessment/Plan: Principal Problem:   Small bowel obstruction Active Problems:   COLLAGENOUS COLITIS   Hypertension   CAD (coronary artery disease)   Hyperlipidemia   Anemia   Heart murmur, systolic   DNR (do not resuscitate)   Aortic stenosis, severe   Arrhythmia   NSTEMI (non-ST elevated myocardial infarction)   Ventricular tachycardia    1. SBO:  Patient presented with 2 days of progressive abdominal distention/pain, culminating in obstipation on 07/17/13. CT abdomen/Pelvis confirmed recurrent SBO. Managing with NG-T suction, NPO, iv fluids and analgesics. Dr Ovidio Kin provided surgical consultation, and has recommended watchful waiting and conservative management for now. Surgery would be considered very high risk, so hoping to avoid. Managing as recommended. No change.  2. Anemia: Patient has a history of chronic anemia. Her last HB in 04/2012 was 11.0, dropped to about 10.0 in 09/2012, and on 07/17/13, it was 7.6, MCV 75. Etiology is unclear. Anemia panel ordered. Transfused 2 units of PRBCs, with satisfactory bump in HB to 9.6. HB has remained stable/satisfactory.  3. Severe Aortic stenosis: Patient has known history of moderate aortic stenosis, per 2D Echocardiogram about a year ago. Repeat 2D Echocardiogram of 07/18/13, showed normal LV cavity size, normal systolic function, and no regional wall motion abnormalities. There was severe aortic valve stenosis. Valve area: 0.8cm^2(VTI). Valve area: 0.79cm^2 (Vmax), moderate mitral regurgitation, moderately dilated LA and RA, as well as PA peak pressure: 45mm Hg (S). No clinical CHF.  4. Arrhythmia/Hypotension: Overnight on 07/18/13, patient developed tachyarrhythmias, with V. Tach, SVT and A,fib with RVR. Dr Armanda Magic provided cardiology consultation, patient was managed with Amiodarone bolus, iv Lopressor and  Amiodarone infusion. Troponin I bumped to 0.43. In AM of 07/19/13, patient was initially in fast A. Fib, but then reverted to SR, with HR in the 80s. Systolic BP 70-83. Bolused 250 ml NS, and after discussion with family, commenced trial of NeoSynephrine, with family accepting of the fact that if this failed, no further escalation would be attempted. Fortunately, patient responded rather dramatically, with resolution of hypotension. NeoSynephrine was discontinued on 07/20/13, without deleterious effect. Still in SR and HR is satisfactory.  5. History of colitis:  Patient has known history of Collagenous colitis, on Entocort. This appears clinically stable at this time. On stress doses of steroids. 6. CAD: Known history of CAD, S/P PCI/Stent. As described above, now has bump in CEs. CEs have leveled off. Patient likely had demand ischemia. Will defer to cardiology, with regards to discontinuation of Heparin infusion.  7. HTN: See discussion in #4 above. Now normalized.  8. GERD: Asymptomatic. 9. LE wounds: Patient has chronic healing LLE wound, possibly associated with venous insufficiency. Regularly follows up at the wound care clinic. WOC consulted and recommendations implemented.  10. Protein-Calorie malnutrition: Clinically, patient appears severely malnourished. Will need nutritionist input in due course.    Code Status: DNR. Family Communication:  Disposition Plan: To be determined.   Brief narrative: 79 y.o. female with hx of mild to moderate aortic stenosis, last ECHO about a year ago, hx of CAD with cardiac stent, prior SBO felt to be secondary to adhesions, resolved with conservative treatments several times, hx of HTN, dyslipidemia, GERD, asthma, anemia of unclear etiology, presenting to the ER with increased abdominal pain and distention for 2 days. Her last BM was on 07/16/13 and it was normal. She denied hemorrhoidal bleed, bloody diarrhea, epistasis,  or any other obvious source of bleeding.  Her last Hb was about a year ago (May 2013) and it was 11 grams/DL, then in Oct 2013, it dropped to about 10 grams, and on 07/17/13, it was 7.6 grams per DL, with MCV 75 and normal Cr. CT Abdomen/Pelvis showed SBO with transitional point in the small bowel, query luminal narrowing. She has normal WBC and the rest of her chemistries were unremarkable. Her last colonoscopy was in 2009, when she was dx with collagenous colitis. She is on very low dose of Entecort. She also has compressive Fx and has been taking taking pain medication regularly. [name] consulted surgery and hospitalist was asked to admit her for SBO and anemia.   Consultants:  Dr Ovidio Kin, surgeon.   Dr Armanda Magic, cardiologist.  Procedures:  CT Abdomen/Pelvis.   CXRs.   AXRs.   Antibiotics:  N/A.   HPI/Subjective: No new issues. Has not passed flatus yet.   Objective: Vital signs in last 24 hours: Temp:  [97.4 F (36.3 C)-98.3 F (36.8 C)] 97.7 F (36.5 C) (07/23 0800) Pulse Rate:  [54-65] 62 (07/23 0800) Resp:  [12-18] 15 (07/23 0800) BP: (140-169)/(58-97) 169/85 mmHg (07/23 0800) SpO2:  [95 %-100 %] 98 % (07/23 0827) Weight:  [40.2 kg (88 lb 10 oz)] 40.2 kg (88 lb 10 oz) (07/23 0400) Weight change: -0.4 kg (-14.1 oz) Last BM Date: 07/17/13  Intake/Output from previous day: 07/22 0701 - 07/23 0700 In: 2345 [I.V.:1745; NG/GT:300; IV Piggyback:300] Out: 2615 [Urine:565; Emesis/NG output:2050] Total I/O In: 148.4 [I.V.:48.4; IV Piggyback:100] Out: -    Physical Exam: General: Alert, communicative, not short of breath at rest. NG-T is in situ, and draining. Looks undernourished.  HEENT:  Mild clinical pallor, no jaundice, no conjunctival injection or discharge. Hydration is fair.  NECK:  Supple, JVP not seen, no carotid bruits, no palpable lymphadenopathy, no palpable goiter. CHEST:  Clinically clear to auscultation. HEART:  Sounds 1 and 2 heard, normal, regular, no murmurs. ABDOMEN:  Still moderately  distended, soft, non-tender, no palpable organomegaly, no palpable masses, very scanty and tinkling bowel sounds. GENITALIA:  Not examined. LOWER EXTREMITIES:  No pitting edema, palpable peripheral pulses. Has dressings on LLE.  MUSCULOSKELETAL SYSTEM:  Generalized osteoarthritic changes and dorsal kyphosis, otherwise, normal. CENTRAL NERVOUS SYSTEM:  No focal neurologic deficit on gross examination.  Lab Results:  Recent Labs  07/20/13 0335 07/21/13 0235  WBC 3.6* 4.8  HGB 9.9* 10.1*  HCT 31.5* 32.1*  PLT 353 318    Recent Labs  07/20/13 0335 07/21/13 0235  NA 142 143  K 3.1* 2.7*  CL 106 106  CO2 28 29  GLUCOSE 115* 117*  BUN 15 17  CREATININE 0.84 0.78  CALCIUM 8.6 8.7   Recent Results (from the past 240 hour(s))  MRSA PCR SCREENING     Status: None   Collection Time    07/19/13 11:47 AM      Result Value Range Status   MRSA by PCR NEGATIVE  NEGATIVE Final   Comment:            The GeneXpert MRSA Assay (FDA     approved for NASAL specimens     only), is one component of a     comprehensive MRSA colonization     surveillance program. It is not     intended to diagnose MRSA     infection nor to guide or     monitor treatment for     MRSA  infections.     Studies/Results: Dg Abd Portable 1v  07/21/2013   *RADIOLOGY REPORT*  Clinical Data: Small bowel obstruction.  Abdominal distention.  PORTABLE ABDOMEN - 1 VIEW  Comparison: 07/19/2013  Findings: NG tube tip is in the body of the stomach.  There is persistent prominent dilatation of the small bowel loops in the mid abdomen, essentially unchanged.  The colon is not distended.  IMPRESSION: Persistent small bowel obstruction, unchanged.   Original Report Authenticated By: Francene Boyers, M.D.    Medications: Scheduled Meds: . sodium chloride   Intravenous Once  . antiseptic oral rinse  15 mL Mouth Rinse BID  . aspirin  300 mg Rectal Daily  . hydrocortisone sod succinate (SOLU-CORTEF) inj  25 mg Intravenous Q12H   . metoprolol  2.5 mg Intravenous Q6H  . mometasone-formoterol  2 puff Inhalation BID  . sodium chloride  3 mL Intravenous Q12H  . triamcinolone cream  1 application Topical Weekly   Continuous Infusions: . amiodarone (NEXTERONE PREMIX) 360 mg/200 mL dextrose 30 mg/hr (07/21/13 0600)  . dextrose 5 % and 0.9% NaCl 50 mL/hr at 07/20/13 2318  . heparin 750 Units/hr (07/21/13 0600)   PRN Meds:.bisacodyl, diphenhydrAMINE, hydrALAZINE, morphine injection, ondansetron (ZOFRAN) IV    LOS: 4 days   Azure Barrales,CHRISTOPHER  [facility] Pager [phone]. If 8PM-8AM, please contact night-coverage at [email or web address], password [facility] 07/21/2013, 9:51 AM  LOS: 4 days

## 2013-07-21 NOTE — Progress Notes (Signed)
Rx Brief Anticoagulation note:  IV Heparin   Assessement:  HL= 0.39 units/ml (confirmatory level)  No problems reported by RN  Plan:  Continue heparin @ 750 units/hr  Recheck in am   Lorenza Evangelist 07/21/2013 3:23 AM

## 2013-07-21 NOTE — Progress Notes (Signed)
Patient ID: Ariel Hunt, female   DOB: 11-Apr-1923, 79 y.o.   MRN: [id number] Small bowel obstruction  Subjective:  Pain in back after having to lay on xray board but otherwise no complaints, denies abd pain, n/v. belching but no flatus/bm, very dry mouth   Objective: Vital signs in last 24 hours: Temp:  [97.4 F (36.3 C)-98.3 F (36.8 C)] 97.9 F (36.6 C) (07/23 0400) Pulse Rate:  [54-65] 56 (07/23 0600) Resp:  [12-18] 12 (07/23 0600) BP: (140-163)/(58-97) 152/97 mmHg (07/23 0600) SpO2:  [95 %-100 %] 96 % (07/23 0600) Weight:  [88 lb 10 oz (40.2 kg)] 88 lb 10 oz (40.2 kg) (07/23 0400) Last BM Date: 07/17/13  Intake/Output from previous day: 07/22 0701 - 07/23 0700 In: 2245 [I.V.:1745; NG/GT:300; IV Piggyback:200] Out: 2615 [Urine:565; Emesis/NG output:2050] Intake/Output this shift:    General appearance: alert and cooperative GI: distended, non-tender, no bowel sounds NG with bilious output 2050cc/24hrs  Lab Results:  Results for orders placed during the hospital encounter of 07/17/13 (from the past 24 hour(s))  HEPARIN LEVEL (UNFRACTIONATED)     Status: Abnormal   Collection Time    07/20/13 12:00 PM      Result Value Range   Heparin Unfractionated 0.21 (*) 0.30 - 0.70 IU/mL  HEPARIN LEVEL (UNFRACTIONATED)     Status: None   Collection Time    07/20/13  8:19 PM      Result Value Range   Heparin Unfractionated 0.36  0.30 - 0.70 IU/mL  CBC     Status: Abnormal   Collection Time    07/21/13  2:35 AM      Result Value Range   WBC 4.8  4.0 - 10.5 K/uL   RBC 3.99  3.87 - 5.11 MIL/uL   Hemoglobin 10.1 (*) 12.0 - 15.0 g/dL   HCT 11.9 (*) 14.7 - 82.9 %   MCV 80.5  78.0 - 100.0 fL   MCH 25.3 (*) 26.0 - 34.0 pg   MCHC 31.5  30.0 - 36.0 g/dL   RDW 56.2 (*) 13.0 - 86.5 %   Platelets 318  150 - 400 K/uL  COMPREHENSIVE METABOLIC PANEL     Status: Abnormal   Collection Time    07/21/13  2:35 AM      Result Value Range   Sodium 143  135 - 145 mEq/L   Potassium 2.7 (*)  3.5 - 5.1 mEq/L   Chloride 106  96 - 112 mEq/L   CO2 29  19 - 32 mEq/L   Glucose, Bld 117 (*) 70 - 99 mg/dL   BUN 17  6 - 23 mg/dL   Creatinine, Ser 7.84  0.50 - 1.10 mg/dL   Calcium 8.7  8.4 - 69.6 mg/dL   Total Protein 5.6 (*) 6.0 - 8.3 g/dL   Albumin 2.8 (*) 3.5 - 5.2 g/dL   AST 18  0 - 37 U/L   ALT 15  0 - 35 U/L   Alkaline Phosphatase 36 (*) 39 - 117 U/L   Total Bilirubin 0.1 (*) 0.3 - 1.2 mg/dL   GFR calc non Af Amer 73 (*) >90 mL/min   GFR calc Af Amer 85 (*) >90 mL/min  HEPARIN LEVEL (UNFRACTIONATED)     Status: None   Collection Time    07/21/13  2:35 AM      Result Value Range   Heparin Unfractionated 0.39  0.30 - 0.70 IU/mL     MEDS, Scheduled . sodium chloride   Intravenous Once  .  antiseptic oral rinse  15 mL Mouth Rinse BID  . aspirin  300 mg Rectal Daily  . hydrocortisone sod succinate (SOLU-CORTEF) inj  25 mg Intravenous Q12H  . metoprolol  2.5 mg Intravenous Q6H  . mometasone-formoterol  2 puff Inhalation BID  . potassium chloride  10 mEq Intravenous Q1 Hr x 4  . sodium chloride  3 mL Intravenous Q12H  . triamcinolone cream  1 application Topical Weekly     Assessment: Small bowel obstruction Anemia  Plan: Films appear to be relatively unchanged No changes clinically this am Continue NG, but will allow some ice chips and popsicles Can try clamping trial once having some flatus Ambulate and OOB as tolerated Will follow for now, would like to avoid surgery if possible     LOS: 4 days   WHITE, [facility], [name] [phone]   07/21/2013 8:08 AM

## 2013-07-21 NOTE — Progress Notes (Signed)
Chaplain consult with pt and daughter while rounding on unit.    Provided emotional and spiritual support around admission, fear d/t possibility of upcoming surgery.  Provided life review and space to voice values with daughter and pt.   Pt relies on faith for strength and coping.  Chaplain provided prayers at bedside with pt.   Will continue to follow for support.   Belva Crome, MDiv

## 2013-07-21 NOTE — Progress Notes (Signed)
SUBJECTIVE:  Maintaining NSR. She thinks belly is softer  OBJECTIVE:   Vitals:   Filed Vitals:   07/21/13 0827 07/21/13 1206 07/21/13 1600 07/21/13 1631  BP:  141/74  139/80  Pulse:  70  61  Temp:   97.9 F (36.6 C)   TempSrc:   Oral   Resp:  16  18  Height:      Weight:      SpO2: 98% 98%  97%   I&O's:    Intake/Output Summary (Last 24 hours) at 07/21/13 1819 Last data filed at 07/21/13 1735  Gross per 24 hour  Intake 2302.37 ml  Output   1515 ml  Net 787.37 ml   TELEMETRY: Reviewed telemetry pt in NSR:     PHYSICAL EXAM General: Well developed, well nourished, in no acute distress Head:  Normal cephalic and atramatic  Lungs:   No wheezing Heart:   HRRR S1 S2  Abdomen: abdomen distended Extremities:   No edema.  DP +1 Neuro: Alert and oriented X 3. Psych:  Good affect, responds appropriately   LABS: Basic Metabolic Panel:  Recent Labs  96/04/54 0325 07/20/13 0335 07/21/13 0235 07/21/13 1535  NA 140 142 143  --   K 3.5 3.1* 2.7* 3.4*  CL 103 106 106  --   CO2 29 28 29   --   GLUCOSE 162* 115* 117*  --   BUN 15 15 17   --   CREATININE 0.78 0.84 0.78  --   CALCIUM 8.8 8.6 8.7  --   MG 2.3 2.3  --   --    Liver Function Tests:  Recent Labs  07/20/13 0335 07/21/13 0235  AST 18 18  ALT 16 15  ALKPHOS 37* 36*  BILITOT 0.1* 0.1*  PROT 5.5* 5.6*  ALBUMIN 2.7* 2.8*   No results found for this basename: LIPASE, AMYLASE,  in the last 72 hours CBC:  Recent Labs  07/20/13 0335 07/21/13 0235  WBC 3.6* 4.8  HGB 9.9* 10.1*  HCT 31.5* 32.1*  MCV 80.2 80.5  PLT 353 318   Cardiac Enzymes:  Recent Labs  07/19/13 0940 07/19/13 1556 07/19/13 2209  TROPONINI 0.43* 0.34* 0.40*   Thyroid Function Tests: No results found for this basename: TSH, T4TOTAL, FREET3, T3FREE, THYROIDAB,  in the last 72 hours Anemia Panel: No results found for this basename: VITAMINB12, FOLATE, FERRITIN, TIBC, IRON, RETICCTPCT,  in the last 72 hours Coag Panel:   Lab  Results  Component Value Date   INR 1.03 05/08/2012    RADIOLOGY: Ct Abdomen Pelvis Wo Contrast  07/17/2013   *RADIOLOGY REPORT*  Clinical Data:  Vomiting  CT ABDOMEN AND PELVIS WITHOUT CONTRAST  Technique:  Multidetector CT imaging of the abdomen and pelvis was performed following the standard protocol without intravenous contrast.  Comparison: 07/11/2011  Findings:  Scar versus atelectasis is noted in the lung bases.  There is no pleural effusion identified.  There is no focal liver abnormality identified.  There are multiple calcified granulomas identified within the spleen.  Gallbladder is unremarkable.  No significant biliary dilatation.  Normal appearance of the pancreas.  The adrenal glands are both within normal limits.    The left kidney appears normal.  The right kidney is normal.  Urinary bladder is unremarkable.  Normal appearance of the uterus for patient's age.  There is calcified atherosclerotic disease affecting the abdominal aorta.  Aneurysmal dilatation of the infrarenal abdominal aorta is noted which measures up to 3.1 cm.  The stomach  appears normal.  There is abnormal small bowel dilatation. The small bowel loops measure up to 3.82 cm.  The distal small bowel loops appear collapsed.  Transition point is in the difficult to identify due to lack of adequate oral contrast material.  There is moderate stool identified within the colon.  The distal colon and rectum appear to be decreased in caliber.  Review of the visualized osseous structures is significant for a marked scoliosis deformity.  There is multilevel degenerative disc disease identified throughout the lumbar spine.  Compression deformities are identified at T11 and T12 which are age indeterminate.  IMPRESSION:  1.  Examination is positive for a small bowel obstruction. Transition point is likely within the central small bowel mesentery. 2.  No evidence for bowel perforation. 3.  Scoliosis and multilevel spondylosis.  There are  compression fractures at T11 and T12.   Original Report Authenticated By: Signa Kell, M.D.   Dg Chest Port 1 View  07/19/2013   *RADIOLOGY REPORT*  Clinical Data: Congestion  PORTABLE CHEST - 1 VIEW  Comparison: Prior radiograph from 09/01/2012  Findings: Enteric tube courses into the abdomen with nonvisualization of the tip and side hole.  Heart size is at the upper limits of normal, stable as compared to prior exam.  The hila are prominent bilaterally.  The lungs are mildly hypoinflated.  There is streaky and linear opacities within both lung bases, most consistent with atelectasis. A small left pleural effusion is present.  There is no overt pulmonary edema.  No pneumothorax.  Irregular biapical pleural thickening is noted.  Diffuse osteopenia is present.  No acute osseous abnormalities. Remote this healed left-sided rib fractures are again noted.  IMPRESSION:  Bibasilar atelectasis, left greater than right, with small left pleural effusion.  No overt pulmonary edema or infectious infiltrate identified.   Original Report Authenticated By: Rise Mu, M.D.   Dg Abd Portable 1v  07/19/2013   *RADIOLOGY REPORT*  Clinical Data: Small bowel obstruction.  Increased pain.  PORTABLE ABDOMEN - 1 VIEW  Comparison: CT abdomen and pelvis 07/17/2013.  Abdomen 07/14/2011.  Findings: Gas filled moderately distended mid abdominal small bowel loops are present with suggestion of fold thickening which could represent edema or infiltrative process.  Paucity of gas in the colon.  Enteric tube with tip in the left upper quadrant consistent with location in the mid stomach.  Sclerosis and degenerative changes in the lumbar spine with vertebral compression deformities noted.  This is stable.  Degenerative changes in the hips.  IMPRESSION: Mid abdominal small bowel distension consistent with obstruction with fold thickening consistent with edema, mural hemorrhage, or inflammatory process.   Original Report  Authenticated By: Burman Nieves, M.D.    ASSESSMENT:  1. Ventricular tachycardia - resolved on IV Amiodarone. Her LVF is normal on recent echo.  2. Atrial fibrillation with RVR now in NSR on IV amio 3. Hypokalemia which may be contributing to afib  4. CAD s/p remote NSTEMI with PCI of OM 2002 with nonobstructive disease elsewhere.  5. SBO - medical management at this time due to unstable cardiac arrhythmias  6. HTN  7. Moderate AS by recent echo with normal LVF  8.  NSTEMI most likely demand ischemia from afib with RVR but VT worrisome for progression of CAD PLAN:  1. Continue IV Amio gtt  2. Replete potassium  3. IV Lopressor until taking PO 4. ASA per rectum/IV Heparin gtt     Corky Crafts., MD  07/21/2013  6:19 PM

## 2013-07-21 NOTE — Progress Notes (Signed)
CRITICAL VALUE ALERT  Critical value received:  K 2.7  Date of notification:  7/23  Time of notification:  0300  Critical value read back:yes  Nurse who received alert:  Carlos Levering   MD notified (1st page):  NP Craige Cotta  Time of first page:  0310  MD notified (2nd page): NP Craige Cotta  Time of second page: [phone]  Responding MD:  NP Craige Cotta   Time MD responded:  [id number]

## 2013-07-22 ENCOUNTER — Inpatient Hospital Stay (HOSPITAL_COMMUNITY): Payer: Medicare Other

## 2013-07-22 LAB — COMPREHENSIVE METABOLIC PANEL
ALT: 13 U/L (ref 0–35)
Albumin: 2.4 g/dL — ABNORMAL LOW (ref 3.5–5.2)
Alkaline Phosphatase: 31 U/L — ABNORMAL LOW (ref 39–117)
BUN: 13 mg/dL (ref 6–23)
Calcium: 8.2 mg/dL — ABNORMAL LOW (ref 8.4–10.5)
Potassium: 2.8 mEq/L — ABNORMAL LOW (ref 3.5–5.1)
Sodium: 144 mEq/L (ref 135–145)
Total Protein: 4.9 g/dL — ABNORMAL LOW (ref 6.0–8.3)

## 2013-07-22 LAB — CBC
MCHC: 31.1 g/dL (ref 30.0–36.0)
Platelets: 306 10*3/uL (ref 150–400)
RDW: 18.5 % — ABNORMAL HIGH (ref 11.5–15.5)
WBC: 6 10*3/uL (ref 4.0–10.5)

## 2013-07-22 LAB — POTASSIUM: Potassium: 3.2 mEq/L — ABNORMAL LOW (ref 3.5–5.1)

## 2013-07-22 MED ORDER — POTASSIUM CHLORIDE 10 MEQ/100ML IV SOLN
10.0000 meq | INTRAVENOUS | Status: AC
  Administered 2013-07-22 (×3): 10 meq via INTRAVENOUS
  Filled 2013-07-22: qty 400

## 2013-07-22 MED ORDER — POTASSIUM CHLORIDE 10 MEQ/100ML IV SOLN: 10.0000 meq | INTRAVENOUS | Status: DC

## 2013-07-22 MED ORDER — HEPARIN (PORCINE) IN NACL 100-0.45 UNIT/ML-% IJ SOLN
800.0000 [IU]/h | INTRAMUSCULAR | Status: DC
  Administered 2013-07-23 – 2013-07-24 (×2): 800 [IU]/h via INTRAVENOUS
  Filled 2013-07-22 (×4): qty 250

## 2013-07-22 MED ORDER — POTASSIUM CHLORIDE 10 MEQ/100ML IV SOLN
10.0000 meq | INTRAVENOUS | Status: AC
  Administered 2013-07-22 (×2): 10 meq via INTRAVENOUS

## 2013-07-22 NOTE — Progress Notes (Signed)
[facility] PROGRESS NOTE  Ariel Hunt AVW:[id number] DOB: [birth year] DOA: 07/17/2013 PCP: Daisy Floro, MD  Assessment/Plan: Principal Problem:   Small bowel obstruction Active Problems:   COLLAGENOUS COLITIS   Hypertension   CAD (coronary artery disease)   Hyperlipidemia   Anemia   Heart murmur, systolic   DNR (do not resuscitate)   Aortic stenosis, severe   Arrhythmia   NSTEMI (non-ST elevated myocardial infarction)   Ventricular tachycardia    1. SBO:  Patient presented with 2 days of progressive abdominal distention/pain, culminating in obstipation on 07/17/13. CT abdomen/Pelvis confirmed recurrent SBO. Managing with NG-T suction, NPO, iv fluids and analgesics. Dr Ovidio Kin provided surgical consultation, and has recommended watchful waiting and conservative management for now. Surgery would be considered very high risk, so hoping to avoid. Managing as recommended. NG-T clamped today. Per surgical team, if no improvement by 07/23/13, will proceed to surgery.   2. Anemia: Patient has a history of chronic anemia. Her last HB in 04/2012 was 11.0, dropped to about 10.0 in 09/2012, and on 07/17/13, it was 7.6, MCV 75. Etiology is unclear. Anemia panel ordered. Transfused 2 units of PRBCs, with satisfactory bump in HB to 9.6. HB has remained stable/satisfactory.  3. Severe Aortic stenosis: Patient has known history of moderate aortic stenosis, per 2D Echocardiogram about a year ago. Repeat 2D Echocardiogram of 07/18/13, showed normal LV cavity size, normal systolic function, and no regional wall motion abnormalities. There was severe aortic valve stenosis. Valve area: 0.8cm^2(VTI). Valve area: 0.79cm^2 (Vmax), moderate mitral regurgitation, moderately dilated LA and RA, as well as PA peak pressure: 45mm Hg (S). No clinical CHF.  4. Arrhythmia/Hypotension: Overnight on 07/18/13, patient developed tachyarrhythmias, with V. Tach, SVT and A,fib with RVR. Dr Armanda Magic provided  cardiology consultation, patient was managed with Amiodarone bolus, iv Lopressor and Amiodarone infusion. Troponin I bumped to 0.43. In AM of 07/19/13, patient was initially in fast A. Fib, but then reverted to SR, with HR in the 80s. Systolic BP 70-83. Bolused 250 ml NS, and after discussion with family, commenced trial of NeoSynephrine, with family accepting of the fact that if this failed, no further escalation would be attempted. Fortunately, patient responded rather dramatically, with resolution of hypotension. NeoSynephrine was discontinued on 07/20/13, without deleterious effect. Still in SR and HR is satisfactory.  5. History of colitis:  Patient has known history of Collagenous colitis, on Entocort. This appears clinically stable at this time. On stress doses of steroids. 6. CAD: Known history of CAD, S/P PCI/Stent. As described above, now has bump in CEs. CEs have leveled off. Patient likely had demand ischemia. Will defer to cardiology, with regards to timing of discontinuation of Heparin infusion.  7. HTN: See discussion in #4 above. Now normalized.  8. GERD: Asymptomatic. 9. LE wounds: Patient has chronic healing LLE wound, possibly associated with venous insufficiency. Regularly follows up at the wound care clinic. WOC consulted and recommendations implemented.  10. Protein-Calorie malnutrition: Clinically, patient appears severely malnourished. Will need nutritionist input in due course. If she goes to OR, TNA will be needed.    Code Status: DNR. Family Communication:  Disposition Plan: To be determined.   Brief narrative: 79 y.o. female with hx of mild to moderate aortic stenosis, last ECHO about a year ago, hx of CAD with cardiac stent, prior SBO felt to be secondary to adhesions, resolved with conservative treatments several times, hx of HTN, dyslipidemia, GERD, asthma, anemia of unclear etiology, presenting to the ER with  increased abdominal pain and distention for 2 days. Her last BM  was on 07/16/13 and it was normal. She denied hemorrhoidal bleed, bloody diarrhea, epistasis, or any other obvious source of bleeding. Her last Hb was about a year ago (May 2013) and it was 11 grams/DL, then in Oct 2013, it dropped to about 10 grams, and on 07/17/13, it was 7.6 grams per DL, with MCV 75 and normal Cr. CT Abdomen/Pelvis showed SBO with transitional point in the small bowel, query luminal narrowing. She has normal WBC and the rest of her chemistries were unremarkable. Her last colonoscopy was in 2009, when she was dx with collagenous colitis. She is on very low dose of Entecort. She also has compressive Fx and has been taking taking pain medication regularly. [name] consulted surgery and hospitalist was asked to admit her for SBO and anemia.   Consultants:  Dr Ovidio Kin, surgeon.   Dr Armanda Magic, cardiologist.  Procedures:  CT Abdomen/Pelvis.   CXRs.   AXRs.   Antibiotics:  N/A.   HPI/Subjective: Ambulant. Has not passed flatus yet.   Objective: Vital signs in last 24 hours: Temp:  [97.4 F (36.3 C)-98.9 F (37.2 C)] 97.5 F (36.4 C) (07/24 0800) Pulse Rate:  [57-70] 57 (07/24 0400) Resp:  [12-18] 14 (07/24 0400) BP: (111-164)/(57-91) 151/79 mmHg (07/24 0400) SpO2:  [93 %-99 %] 96 % (07/24 0400) Weight change:  Last BM Date: 07/17/13  Intake/Output from previous day: 07/23 0701 - 07/24 0700 In: 2480.8 [P.O.:400; I.V.:1780.8; IV Piggyback:300] Out: 2520 [Urine:720; Emesis/NG output:1800]     Physical Exam: General: Alert, communicative, not short of breath at rest. NG-T is in situ, and draining. Looks undernourished.  HEENT:  Mild clinical pallor, no jaundice, no conjunctival injection or discharge. Hydration is satisfactory.  NECK:  Supple, JVP not seen, no carotid bruits, no palpable lymphadenopathy, no palpable goiter. CHEST:  Clinically clear to auscultation. HEART:  Sounds 1 and 2 heard, normal, regular, no murmurs. ABDOMEN:  Still moderately  distended, soft, non-tender, no palpable organomegaly, no palpable masses. Bowel sounds faint, but more appreciable.  GENITALIA:  Not examined. LOWER EXTREMITIES:  No pitting edema, palpable peripheral pulses. Has dressings on LLE.  MUSCULOSKELETAL SYSTEM:  Generalized osteoarthritic changes and dorsal kyphosis, otherwise, normal. CENTRAL NERVOUS SYSTEM:  No focal neurologic deficit on gross examination.  Lab Results:  Recent Labs  07/21/13 0235 07/22/13 0335  WBC 4.8 6.0  HGB 10.1* 9.9*  HCT 32.1* 31.8*  PLT 318 306    Recent Labs  07/21/13 0235 07/21/13 1535 07/22/13 0335  NA 143  --  144  K 2.7* 3.4* 2.8*  CL 106  --  107  CO2 29  --  29  GLUCOSE 117*  --  117*  BUN 17  --  13  CREATININE 0.78  --  0.71  CALCIUM 8.7  --  8.2*   Recent Results (from the past 240 hour(s))  MRSA PCR SCREENING     Status: None   Collection Time    07/19/13 11:47 AM      Result Value Range Status   MRSA by PCR NEGATIVE  NEGATIVE Final   Comment:            The GeneXpert MRSA Assay (FDA     approved for NASAL specimens     only), is one component of a     comprehensive MRSA colonization     surveillance program. It is not     intended to diagnose MRSA  infection nor to guide or     monitor treatment for     MRSA infections.     Studies/Results: Dg Abd Portable 1v  07/22/2013   *RADIOLOGY REPORT*  Clinical Data: Follow-up small bowel obstruction  PORTABLE ABDOMEN - 1 VIEW  Comparison: Yesterday  Findings: Distended small bowel loops are stable.  The NG tube remains in place and the stomach is very decompressed with minimal gas. No obvious free intraperitoneal gas.  Colonic gas is present. Postoperative changes are noted.  Vascular calcifications. Osteopenia.  Severe levoscoliosis at L2-3.  IMPRESSION: Stable partial small bowel obstruction pattern.   Original Report Authenticated By: Jolaine Click, M.D.   Dg Abd Portable 1v  07/21/2013   *RADIOLOGY REPORT*  Clinical Data: Small  bowel obstruction.  Abdominal distention.  PORTABLE ABDOMEN - 1 VIEW  Comparison: 07/19/2013  Findings: NG tube tip is in the body of the stomach.  There is persistent prominent dilatation of the small bowel loops in the mid abdomen, essentially unchanged.  The colon is not distended.  IMPRESSION: Persistent small bowel obstruction, unchanged.   Original Report Authenticated By: Francene Boyers, M.D.    Medications: Scheduled Meds: . antiseptic oral rinse  15 mL Mouth Rinse BID  . aspirin  300 mg Rectal Daily  . hydrocortisone sod succinate (SOLU-CORTEF) inj  25 mg Intravenous Q12H  . metoprolol  2.5 mg Intravenous Q6H  . mometasone-formoterol  2 puff Inhalation BID  . potassium chloride  10 mEq Intravenous Q1 Hr x 4  . sodium chloride  3 mL Intravenous Q12H  . triamcinolone cream  1 application Topical Weekly   Continuous Infusions: . amiodarone (NEXTERONE PREMIX) 360 mg/200 mL dextrose 30 mg/hr (07/22/13 0600)  . dextrose 5 % and 0.9% NaCl 50 mL/hr at 07/21/13 2002  . heparin     PRN Meds:.bisacodyl, diphenhydrAMINE, hydrALAZINE, morphine injection, ondansetron (ZOFRAN) IV    LOS: 5 days   Katrinia Straker,CHRISTOPHER  [facility] Pager [phone]. If 8PM-8AM, please contact night-coverage at [email or web address], password [facility] 07/22/2013, 9:09 AM  LOS: 5 days

## 2013-07-22 NOTE — Progress Notes (Signed)
Patient ID: Ariel Hunt, female   DOB: 23-Dec-1923, 79 y.o.   MRN: [id number] Small bowel obstruction  Subjective:  Feels like her abdomen is less distended today and feels lots of movement, denies abd pain, n/v. belching but no flatus/bm, very dry mouth   Objective: Vital signs in last 24 hours: Temp:  [97.4 F (36.3 C)-98.9 F (37.2 C)] 97.4 F (36.3 C) (07/24 0400) Pulse Rate:  [57-70] 57 (07/24 0400) Resp:  [12-18] 14 (07/24 0400) BP: (111-164)/(57-91) 151/79 mmHg (07/24 0400) SpO2:  [93 %-99 %] 96 % (07/24 0400) Last BM Date: 07/17/13  Intake/Output from previous day: 07/23 0701 - 07/24 0700 In: 2480.8 [P.O.:400; I.V.:1780.8; IV Piggyback:300] Out: 2520 [Urine:720; Emesis/NG output:1800] Intake/Output this shift:    General appearance: alert and cooperative GI: less distended, non-tender, active bowel sounds NG with bilious output 1800c/24hrs  Lab Results:  Results for orders placed during the hospital encounter of 07/17/13 (from the past 24 hour(s))  POTASSIUM     Status: Abnormal   Collection Time    07/21/13  3:35 PM      Result Value Range   Potassium 3.4 (*) 3.5 - 5.1 mEq/L  CBC     Status: Abnormal   Collection Time    07/22/13  3:35 AM      Result Value Range   WBC 6.0  4.0 - 10.5 K/uL   RBC 3.96  3.87 - 5.11 MIL/uL   Hemoglobin 9.9 (*) 12.0 - 15.0 g/dL   HCT 40.9 (*) 81.1 - 91.4 %   MCV 80.3  78.0 - 100.0 fL   MCH 25.0 (*) 26.0 - 34.0 pg   MCHC 31.1  30.0 - 36.0 g/dL   RDW 78.2 (*) 95.6 - 21.3 %   Platelets 306  150 - 400 K/uL  HEPARIN LEVEL (UNFRACTIONATED)     Status: None   Collection Time    07/22/13  3:35 AM      Result Value Range   Heparin Unfractionated 0.30  0.30 - 0.70 IU/mL  COMPREHENSIVE METABOLIC PANEL     Status: Abnormal   Collection Time    07/22/13  3:35 AM      Result Value Range   Sodium 144  135 - 145 mEq/L   Potassium 2.8 (*) 3.5 - 5.1 mEq/L   Chloride 107  96 - 112 mEq/L   CO2 29  19 - 32 mEq/L   Glucose, Bld 117 (*) 70  - 99 mg/dL   BUN 13  6 - 23 mg/dL   Creatinine, Ser 0.86  0.50 - 1.10 mg/dL   Calcium 8.2 (*) 8.4 - 10.5 mg/dL   Total Protein 4.9 (*) 6.0 - 8.3 g/dL   Albumin 2.4 (*) 3.5 - 5.2 g/dL   AST 19  0 - 37 U/L   ALT 13  0 - 35 U/L   Alkaline Phosphatase 31 (*) 39 - 117 U/L   Total Bilirubin 0.2 (*) 0.3 - 1.2 mg/dL   GFR calc non Af Amer 75 (*) >90 mL/min   GFR calc Af Amer 87 (*) >90 mL/min     MEDS, Scheduled . antiseptic oral rinse  15 mL Mouth Rinse BID  . aspirin  300 mg Rectal Daily  . hydrocortisone sod succinate (SOLU-CORTEF) inj  25 mg Intravenous Q12H  . metoprolol  2.5 mg Intravenous Q6H  . mometasone-formoterol  2 puff Inhalation BID  . potassium chloride  10 mEq Intravenous Q1 Hr x 4  . sodium chloride  3  mL Intravenous Q12H  . triamcinolone cream  1 application Topical Weekly     Assessment: Small bowel obstruction Anemia  Plan: Films appear to be relatively unchanged Abd less distended today and more bowel sounds Continue NG, but will allow some ice chips and popsicles Will follow for now, would like to avoid surgery if possible  If no flatus by tomorrow then patient will need to go to OR tomorrow, will make NPO after MN, will contact daugther, son to be here at 9am, will let Dr. Daphine Deutscher know so he can come speak with them.     LOS: 5 days   WHITE, [facility], [name] [phone]   07/22/2013 8:04 AM    Abdomen is flat and nontender.  I have encouraged patient to change positions in bed frequently.  Maybe should add TNA since NPO this week.  May watch another day or so as patient wishes to avoid surgery.

## 2013-07-22 NOTE — Progress Notes (Signed)
ANTICOAGULATION CONSULT NOTE - Follow Up Consult  Pharmacy Consult for Heparin Indication: NSTEMI, Afib  No Known Allergies  Patient Measurements: Height: 5\' 4"  (162.6 cm) Weight: 88 lb 10 oz (40.2 kg) IBW/kg (Calculated) : 54.7 Heparin Dosing Weight: 40.6kg  Vital Signs: Temp: 97.4 F (36.3 C) (07/24 0400) Temp src: Axillary (07/24 0400) BP: 151/79 mmHg (07/24 0400) Pulse Rate: 57 (07/24 0400)  Labs:  Recent Labs  07/19/13 0940 07/19/13 1556  07/19/13 2209  07/20/13 0335  07/20/13 2019 07/21/13 0235 07/22/13 0335  HGB  --   --   --   --   < > 9.9*  --   --  10.1* 9.9*  HCT  --   --   --   --   --  31.5*  --   --  32.1* 31.8*  PLT  --   --   --   --   --  353  --   --  318 306  HEPARINUNFRC  --   --   < >  --   --   --   < > 0.36 0.39 0.30  CREATININE  --   --   --   --   --  0.84  --   --  0.78 0.71  TROPONINI 0.43* 0.34*  --  0.40*  --   --   --   --   --   --   < > = values in this interval not displayed.  Estimated Creatinine Clearance: 31.4 ml/min (by C-G formula based on Cr of 0.71).   Medications:  Scheduled:  . antiseptic oral rinse  15 mL Mouth Rinse BID  . aspirin  300 mg Rectal Daily  . hydrocortisone sod succinate (SOLU-CORTEF) inj  25 mg Intravenous Q12H  . metoprolol  2.5 mg Intravenous Q6H  . mometasone-formoterol  2 puff Inhalation BID  . potassium chloride  10 mEq Intravenous Q1 Hr x 4  . sodium chloride  3 mL Intravenous Q12H  . triamcinolone cream  1 application Topical Weekly   Infusions:  . amiodarone (NEXTERONE PREMIX) 360 mg/200 mL dextrose 30 mg/hr (07/22/13 0600)  . dextrose 5 % and 0.9% NaCl 50 mL/hr at 07/21/13 2002  . heparin 750 Units/hr (07/22/13 0600)    Assessment: 79 year old female admitted 7/19 with small bowel obstruction. Transferred to ICU late 7/20 pm with NSTEMI and new Afib/RVR and started on IV heparin.   Heparin level is at low end of therapeutic range (0.30)  H/H low but stable, platelets stable  No bleeding  events reported in chart notes   Goal of Therapy:  Heparin levels 0.3-0.7 Monitor platelets by anticoagulation protocol: Yes   Plan:   Increase heparin slightly to 800 units/hr to prevent further drop  Follow up heparin level and CBC in am  Follow up if planning oral anticoagulation once able to take POs  Loralee Pacas, PharmD, BCPS Pager: [phone]  07/22/2013,7:10 AM

## 2013-07-22 NOTE — Progress Notes (Signed)
Subjective:  Sitting up in chair with her son at bedside. NG tube with dark secretions noted. No complaints currently of discomfort   Objective:  Vital Signs in the last 24 hours: Temp:  [97.4 F (36.3 C)-98.9 F (37.2 C)] 97.5 F (36.4 C) (07/24 0800) Pulse Rate:  [57-70] 67 (07/24 0800) Resp:  [12-18] 16 (07/24 0800) BP: (97-164)/(57-91) 97/70 mmHg (07/24 0800) SpO2:  [73 %-99 %] 73 % (07/24 0800)  Intake/Output from previous day: 07/23 0701 - 07/24 0700 In: 2480.8 [P.O.:400; I.V.:1780.8; IV Piggyback:300] Out: 2520 [Urine:720; Emesis/NG output:1800]   Physical Exam: General: Elderly, thin, malnourished in no acute distress. Head:  Normocephalic and atraumatic. NG tube in place Lungs: Clear to auscultation and percussion. Heart: Normal S1 and S2.  2/6 systolic murmur right upper sternal border, no rubs or gallops. No JVD  Abdomen: No bowel sounds, distended. Extremities: No clubbing or cyanosis. No edema. Neurologic: Alert and oriented x 3.    Lab Results:  Recent Labs  07/21/13 0235 07/22/13 0335  WBC 4.8 6.0  HGB 10.1* 9.9*  PLT 318 306    Recent Labs  07/21/13 0235 07/21/13 1535 07/22/13 0335  NA 143  --  144  K 2.7* 3.4* 2.8*  CL 106  --  107  CO2 29  --  29  GLUCOSE 117*  --  117*  BUN 17  --  13  CREATININE 0.78  --  0.71    Recent Labs  07/19/13 1556 07/19/13 2209  TROPONINI 0.34* 0.40*   Hepatic Function Panel  Recent Labs  07/22/13 0335  PROT 4.9*  ALBUMIN 2.4*  AST 19  ALT 13  ALKPHOS 31*  BILITOT 0.2*    Imaging: Dg Abd Portable 1v  07/22/2013   *RADIOLOGY REPORT*  Clinical Data: Follow-up small bowel obstruction  PORTABLE ABDOMEN - 1 VIEW  Comparison: Yesterday  Findings: Distended small bowel loops are stable.  The NG tube remains in place and the stomach is very decompressed with minimal gas. No obvious free intraperitoneal gas.  Colonic gas is present. Postoperative changes are noted.  Vascular calcifications. Osteopenia.   Severe levoscoliosis at L2-3.  IMPRESSION: Stable partial small bowel obstruction pattern.   Original Report Authenticated By: Jolaine Click, M.D.   Dg Abd Portable 1v  07/21/2013   *RADIOLOGY REPORT*  Clinical Data: Small bowel obstruction.  Abdominal distention.  PORTABLE ABDOMEN - 1 VIEW  Comparison: 07/19/2013  Findings: NG tube tip is in the body of the stomach.  There is persistent prominent dilatation of the small bowel loops in the mid abdomen, essentially unchanged.  The colon is not distended.  IMPRESSION: Persistent small bowel obstruction, unchanged.   Original Report Authenticated By: Francene Boyers, M.D.   Personally viewed.   Telemetry: At 2:30 AM, had approximately 15 beats of polymorphic ventricular tachycardia. Currently sinus rhythm Personally viewed.    Cardiac Studies:  Echocardiogram with severe aortic stenosis, normal ejection fraction, moderate mitral regurgitation, mild to moderate pulmonary hypertension  Assessment/Plan:   79 year old female with small bowel obstruction, moderate aortic stenosis, non-ST elevation myocardial infarction, polymorphic ventricular tachycardia, anemia, hypokalemia.  1. Ventricular tachycardia-one burst of nonsustained ventricular tachycardia of approximally 15 beats noted at 2:30 AM. No further occurrences. May have been exacerbated by hypokalemia. Continue with IV amiodarone which has helped tremendously on decreasing ventricular tachycardia. Ejection fraction normal.  2. Hypokalemia-likely contributing to ventricular tachycardia. Continue to replete.  3. Coronary artery disease-remote non-ST elevation myocardial infarction with PCI of obtuse marginal in 2002 with  nonobstructive disease elsewhere. Recent non-ST elevation myocardial infarction most likely type 2/demand ischemia however with ventricular tachycardia, worrisome for progression of coronary artery disease. Discussed with she and her son. Continuing with aspirin per rectum/IV heparin,  IV Lopressor until able to take by mouth.  4. Small bowel obstruction-medical management at this time. If surgery is required, she would be at high risk from a cardiovascular standpoint. Thankfully, her ventricular tachycardia has reduced in overall severity/frequency but she still remains at increased risk from a cardiovascular standpoint. Both she and her son understand this. Continue with amiodarone.  5. Hypertension-stable  6. Moderate to severe aortic stenosis on echocardiogram - calculated valve area was severe however peak velocity and mean gradient was in moderate range.  Once again, if hypotension recurs, try to avoid beta agonist if possible.    Maxyne Derocher 07/22/2013, 10:31 AM

## 2013-07-23 ENCOUNTER — Inpatient Hospital Stay (HOSPITAL_COMMUNITY): Payer: Medicare Other

## 2013-07-23 DIAGNOSIS — E876 Hypokalemia: Secondary | ICD-10-CM | POA: Diagnosis not present

## 2013-07-23 DIAGNOSIS — I472 Ventricular tachycardia: Secondary | ICD-10-CM

## 2013-07-23 LAB — COMPREHENSIVE METABOLIC PANEL
ALT: 15 U/L (ref 0–35)
AST: 19 U/L (ref 0–37)
Albumin: 2.5 g/dL — ABNORMAL LOW (ref 3.5–5.2)
Alkaline Phosphatase: 34 U/L — ABNORMAL LOW (ref 39–117)
CO2: 30 mEq/L (ref 19–32)
Chloride: 107 mEq/L (ref 96–112)
Creatinine, Ser: 0.68 mg/dL (ref 0.50–1.10)
GFR calc non Af Amer: 76 mL/min — ABNORMAL LOW (ref >90)
Potassium: 2.5 mEq/L — CL (ref 3.5–5.1)
Total Bilirubin: 0.2 mg/dL — ABNORMAL LOW (ref 0.3–1.2)

## 2013-07-23 LAB — DIFFERENTIAL
Basophils Relative: 0 % (ref 0–1)
Lymphocytes Relative: 17 % (ref 12–46)
Lymphs Abs: 1.1 10*3/uL (ref 0.7–4.0)
Monocytes Relative: 10 % (ref 3–12)
Neutro Abs: 4.8 10*3/uL (ref 1.7–7.7)
Neutrophils Relative %: 73 % (ref 43–77)

## 2013-07-23 LAB — BASIC METABOLIC PANEL
BUN: 9 mg/dL (ref 6–23)
Chloride: 105 mEq/L (ref 96–112)
GFR calc Af Amer: 87 mL/min — ABNORMAL LOW (ref >90)
Glucose, Bld: 89 mg/dL (ref 70–99)
Potassium: 3.2 mEq/L — ABNORMAL LOW (ref 3.5–5.1)

## 2013-07-23 LAB — CBC
Hemoglobin: 10.1 g/dL — ABNORMAL LOW (ref 12.0–15.0)
MCH: 25.1 pg — ABNORMAL LOW (ref 26.0–34.0)
RBC: 4.03 MIL/uL (ref 3.87–5.11)
WBC: 6.5 10*3/uL (ref 4.0–10.5)

## 2013-07-23 LAB — HEPARIN LEVEL (UNFRACTIONATED): Heparin Unfractionated: 0.53 IU/mL (ref 0.30–0.70)

## 2013-07-23 MED ORDER — POTASSIUM CHLORIDE 10 MEQ/100ML IV SOLN
10.0000 meq | INTRAVENOUS | Status: AC
  Administered 2013-07-23 (×2): 10 meq via INTRAVENOUS
  Filled 2013-07-23: qty 200

## 2013-07-23 MED ORDER — KCL IN DEXTROSE-NACL 40-5-0.9 MEQ/L-%-% IV SOLN
INTRAVENOUS | Status: DC
  Administered 2013-07-23: 09:00:00 via INTRAVENOUS
  Filled 2013-07-23 (×2): qty 1000

## 2013-07-23 MED ORDER — POTASSIUM CHLORIDE 10 MEQ/100ML IV SOLN
10.0000 meq | INTRAVENOUS | Status: AC
  Administered 2013-07-23 (×4): 10 meq via INTRAVENOUS
  Filled 2013-07-23: qty 400

## 2013-07-23 MED ORDER — SODIUM CHLORIDE 0.9 % IJ SOLN
10.0000 mL | Freq: Two times a day (BID) | INTRAMUSCULAR | Status: DC
  Administered 2013-07-24 – 2013-07-28 (×6): 10 mL

## 2013-07-23 MED ORDER — KCL IN DEXTROSE-NACL 20-5-0.9 MEQ/L-%-% IV SOLN
INTRAVENOUS | Status: DC
  Administered 2013-07-23: 07:00:00 via INTRAVENOUS
  Filled 2013-07-23: qty 1000

## 2013-07-23 MED ORDER — FAT EMULSION 20 % IV EMUL
250.0000 mL | INTRAVENOUS | Status: AC
  Administered 2013-07-23: 250 mL via INTRAVENOUS
  Filled 2013-07-23: qty 250

## 2013-07-23 MED ORDER — CHLORHEXIDINE GLUCONATE 0.12 % MT SOLN
15.0000 mL | Freq: Two times a day (BID) | OROMUCOSAL | Status: DC
  Administered 2013-07-23 – 2013-08-02 (×20): 15 mL via OROMUCOSAL
  Filled 2013-07-23 (×22): qty 15

## 2013-07-23 MED ORDER — TRACE MINERALS CR-CU-F-FE-I-MN-MO-SE-ZN IV SOLN
INTRAVENOUS | Status: AC
  Administered 2013-07-23: 20:00:00 via INTRAVENOUS
  Filled 2013-07-23: qty 1000

## 2013-07-23 MED ORDER — FAMOTIDINE IN NACL 20-0.9 MG/50ML-% IV SOLN
20.0000 mg | Freq: Two times a day (BID) | INTRAVENOUS | Status: DC
  Administered 2013-07-24 – 2013-07-25 (×5): 20 mg via INTRAVENOUS
  Filled 2013-07-23 (×8): qty 50

## 2013-07-23 MED ORDER — SODIUM CHLORIDE 0.9 % IJ SOLN
10.0000 mL | INTRAMUSCULAR | Status: DC | PRN
  Administered 2013-07-30 – 2013-08-02 (×4): 10 mL
  Administered 2013-08-02: 20 mL

## 2013-07-23 MED ORDER — POTASSIUM CHLORIDE 10 MEQ/100ML IV SOLN
10.0000 meq | INTRAVENOUS | Status: AC
  Administered 2013-07-23 – 2013-07-24 (×4): 10 meq via INTRAVENOUS
  Filled 2013-07-23 (×4): qty 100

## 2013-07-23 NOTE — Progress Notes (Signed)
NUTRITION FOLLOW UP  Intervention:   TPN per PharmD; recommend Clinamix E 5/15 goal rate of 50 ml/hr, lipids 10 ml/hr daily, to meet 100% of estimated calorie and protein needs. RD will continue to monitor and provide supplements once diet advanced  Nutrition Dx:   Inadequate oral intake related to inability to eat as evidenced by NPO status; ongoing  Goal:   Pt to meet >/= 90% of their estimated nutrition needs; not met  Monitor:   TPN initiation/rate Bowel function; SBO improving Weight; up 7 lbs from admission wt Labs; low hemoglobin, low potassium, low calcium, low albumin, low prealbumin  Assessment:   Per multidisciplinary rounds pt's SBO is improving. NG tube still in place for suction. Pt states she has been walking daily. Plan to place PICC and start TPN tonight. Per pharmacy note, plan is to  Start Clinimix E5/15 at 72ml/hr at 1800 today. This will provided 991 kcal and 36 grams of protein and will meet 76% of estimated calorie needs and 72% of estimated kcal needs.   Height: Ht Readings from Last 1 Encounters:  07/18/13 5\' 4"  (1.626 m)    Weight Status:   Wt Readings from Last 1 Encounters:  07/23/13 96 lb 1.9 oz (43.6 kg)    Re-estimated needs:  Kcal: 1300-1500  Protein: 50-60 grams  Fluid: > 1.2 L  Skin: abrasion on right leg, wound on left leg  Diet Order: NPO   Intake/Output Summary (Last 24 hours) at 07/23/13 1451 Last data filed at 07/23/13 1400  Gross per 24 hour  Intake 3146.4 ml  Output   3175 ml  Net  -28.6 ml    Last BM: 7/19   Labs:   Recent Labs Lab 07/20/13 0335 07/21/13 0235  07/22/13 0335 07/22/13 1405 07/23/13 0400  NA 142 143  --  144  --  144  K 3.1* 2.7*  < > 2.8* 3.2* 2.5*  CL 106 106  --  107  --  107  CO2 28 29  --  29  --  30  BUN 15 17  --  13  --  9  CREATININE 0.84 0.78  --  0.71  --  0.68  CALCIUM 8.6 8.7  --  8.2*  --  8.0*  MG 2.3  --   --   --  2.2 2.1  PHOS  --   --   --   --   --  2.7  GLUCOSE 115*  117*  --  117*  --  106*  < > = values in this interval not displayed.  CBG (last 3)  No results found for this basename: GLUCAP,  in the last 72 hours  Scheduled Meds: . antiseptic oral rinse  15 mL Mouth Rinse BID  . aspirin  300 mg Rectal Daily  . chlorhexidine  15 mL Mouth/Throat BID  . hydrocortisone sod succinate (SOLU-CORTEF) inj  25 mg Intravenous Q12H  . metoprolol  2.5 mg Intravenous Q6H  . mometasone-formoterol  2 puff Inhalation BID  . sodium chloride  3 mL Intravenous Q12H  . triamcinolone cream  1 application Topical Weekly    Continuous Infusions: . amiodarone (NEXTERONE PREMIX) 360 mg/200 mL dextrose 30 mg/hr (07/23/13 0600)  . dextrose 5 % and 0.9 % NaCl with KCl 40 mEq/L 50 mL/hr at 07/23/13 0902  . Marland KitchenTPN (CLINIMIX-E) Adult     And  . fat emulsion    . heparin 800 Units/hr (07/23/13 0600)    Ian Malkin RD,  LDN Inpatient Clinical Dietitian Pager: [phone] After Hours Pager: [phone]

## 2013-07-23 NOTE — Progress Notes (Signed)
PARENTERAL NUTRITION CONSULT NOTE - INITIAL  Pharmacy Consult for TNA Indication: SBO, prolonged NPO  No Known Allergies  Patient Measurements: Height: 5\' 4"  (162.6 cm) Weight: 96 lb 1.9 oz (43.6 kg) IBW/kg (Calculated) : 54.7 Usual wieght: 44kg  Vital Signs: Temp: 98 F (36.7 C) (07/25 0400) Temp src: Oral (07/25 0400) BP: 151/82 mmHg (07/25 0735) Pulse Rate: 63 (07/25 0800) Intake/Output from previous day: 07/24 0701 - 07/25 0700 In: 3316.5 [P.O.:1200; I.V.:1816.5; IV Piggyback:300] Out: 2925 [Urine:825; Emesis/NG output:2100] Intake/Output from this shift: Total I/O In: 174.7 [I.V.:74.7; IV Piggyback:100] Out: 100 [Urine:100]  Labs:  Recent Labs  07/21/13 0235 07/22/13 0335 07/23/13 0400  WBC 4.8 6.0 6.5  HGB 10.1* 9.9* 10.1*  HCT 32.1* 31.8* 32.4*  PLT 318 306 308     Recent Labs  07/21/13 0235  07/22/13 0335 07/22/13 1405 07/23/13 0400  NA 143  --  144  --  144  K 2.7*  < > 2.8* 3.2* 2.5*  CL 106  --  107  --  107  CO2 29  --  29  --  30  GLUCOSE 117*  --  117*  --  106*  BUN 17  --  13  --  9  CREATININE 0.78  --  0.71  --  0.68  CALCIUM 8.7  --  8.2*  --  8.0*  MG  --   --   --  2.2 2.1  PHOS  --   --   --   --  2.7  PROT 5.6*  --  4.9*  --  5.0*  ALBUMIN 2.8*  --  2.4*  --  2.5*  AST 18  --  19  --  19  ALT 15  --  13  --  15  ALKPHOS 36*  --  31*  --  34*  BILITOT 0.1*  --  0.2*  --  0.2*  TRIG  --   --   --   --  68  < > = values in this interval not displayed. Corrected calcium = 9.2 Estimated Creatinine Clearance: 34.1 ml/min (by C-G formula based on Cr of 0.68).   No results found for this basename: GLUCAP,  in the last 72 hours  Medical History: Past Medical History  Diagnosis Date  . Colitis   . Weight loss   . Diarrhea   . CAD (coronary artery disease)   . Hyperlipidemia   . Hypertension   . Asthma   . GERD (gastroesophageal reflux disease)   . Atrial fibrillation     Medications:  Scheduled:  . antiseptic oral  rinse  15 mL Mouth Rinse BID  . aspirin  300 mg Rectal Daily  . hydrocortisone sod succinate (SOLU-CORTEF) inj  25 mg Intravenous Q12H  . metoprolol  2.5 mg Intravenous Q6H  . mometasone-formoterol  2 puff Inhalation BID  . potassium chloride  10 mEq Intravenous Q1 Hr x 4  . potassium chloride  10 mEq Intravenous Q1 Hr x 2  . sodium chloride  3 mL Intravenous Q12H  . triamcinolone cream  1 application Topical Weekly   Infusions:  . amiodarone (NEXTERONE PREMIX) 360 mg/200 mL dextrose 30 mg/hr (07/23/13 0600)  . dextrose 5 % and 0.9 % NaCl with KCl 40 mEq/L    . heparin 800 Units/hr (07/23/13 0600)    Current Nutrition:  NPO except ice, popsicles  IVF: D5NS 20KCl at 50 ml/min  Assessment: 79 year old female admitted 7/19 with small bowel  obstruction. Also developed cardiac arrhythmias and NSTEMI. She has been NPO since admit. Plan is to avoid surgery if possible due to high risk. Place PICC and begin TNA today 7/25 per Pharmacy.  -Renal/hepatic function wnl and stable -Lytes: K+ low requiring daily IV replacement. Mg/Phos, corrected Ca wnl. -Glucose: <120 on daily BMETs -Triglycerides: wnl, 68 (7/25) -Prealbumin: pending 7/25  Nutritional Goals:  RD assessment 7/22:  Kcal: 1300-1500  Protein: 50-60 grams  Fluid: > 1.2 L  TNA Day: #1 TNA Access: PICC placement today  Plan:  At 1800 today:  Start Clinimix E5/15 at 8ml/hr.  20% fat emulsion at 56ml/hr.  Plan to advance as tolerated to the goal rate 40ml/hr.  TNA to contain standard multivitamins and trace elements daily.  Change IVF to D5NS 40KCl at 42ml/hr  KCl x 6 runs today per MD  CBGs q8h - add SSI if needed.   TNA lab panels on Mondays & Thursdays.  F/u daily.   Loralee Pacas, PharmD, BCPS Pager: [phone] 07/23/2013,8:17 AM

## 2013-07-23 NOTE — Progress Notes (Signed)
ANTICOAGULATION CONSULT NOTE - Follow Up Consult  Pharmacy Consult for Heparin Indication: NSTEMI, Afib  No Known Allergies  Patient Measurements: Height: 5\' 4"  (162.6 cm) Weight: 96 lb 1.9 oz (43.6 kg) IBW/kg (Calculated) : 54.7 Heparin Dosing Weight: 40.6kg  Vital Signs: Temp: 98 F (36.7 C) (07/25 0400) Temp src: Oral (07/25 0400) BP: 132/111 mmHg (07/25 0400) Pulse Rate: 58 (07/25 0400)  Labs:  Recent Labs  07/21/13 0235 07/22/13 0335 07/23/13 0400  HGB 10.1* 9.9* 10.1*  HCT 32.1* 31.8* 32.4*  PLT 318 306 308  HEPARINUNFRC 0.39 0.30 0.53  CREATININE 0.78 0.71 0.68    Estimated Creatinine Clearance: 34.1 ml/min (by C-G formula based on Cr of 0.68).   Medications:  Scheduled:  . antiseptic oral rinse  15 mL Mouth Rinse BID  . aspirin  300 mg Rectal Daily  . hydrocortisone sod succinate (SOLU-CORTEF) inj  25 mg Intravenous Q12H  . metoprolol  2.5 mg Intravenous Q6H  . mometasone-formoterol  2 puff Inhalation BID  . potassium chloride  10 mEq Intravenous Q1 Hr x 4  . sodium chloride  3 mL Intravenous Q12H  . triamcinolone cream  1 application Topical Weekly   Infusions:  . amiodarone (NEXTERONE PREMIX) 360 mg/200 mL dextrose 30 mg/hr (07/23/13 0600)  . dextrose 5 % and 0.9 % NaCl with KCl 20 mEq/L 50 mL/hr at 07/23/13 0654  . heparin 800 Units/hr (07/23/13 0600)    Assessment: 79 year old female admitted 7/19 with small bowel obstruction. Transferred to ICU late 7/20 pm with NSTEMI and new Afib/RVR and started on IV heparin.   Heparin level therapeutic (0.53)  H/H low but stable, platelets stable  No bleeding events reported in chart notes   Goal of Therapy:  Heparin levels 0.3-0.7 Monitor platelets by anticoagulation protocol: Yes   Plan:   Continue heparin 800 units/hr   Follow up heparin level and CBC in am  Follow up if planning surgery - if so, MD to specify when to hold heparin  Loralee Pacas, PharmD, BCPS Pager: [phone]   07/23/2013,6:59 AM

## 2013-07-23 NOTE — Progress Notes (Signed)
Patient ID: Ariel Hunt, female   DOB: 11/24/1923, 79 y.o.   MRN: [id number] Small bowel obstruction  Subjective:  Feels like abdomen is even less distended today and feels lots of movement, denies abd pain, n/v. belching but no flatus/bm, very dry mouth   Objective: Vital signs in last 24 hours: Temp:  [97.5 F (36.4 C)-98.3 F (36.8 C)] 98 F (36.7 C) (07/25 0400) Pulse Rate:  [55-67] 58 (07/25 0400) Resp:  [12-21] 19 (07/25 0400) BP: (97-165)/(65-133) 132/111 mmHg (07/25 0400) SpO2:  [73 %-100 %] 98 % (07/25 0400) Weight:  [96 lb 1.9 oz (43.6 kg)] 96 lb 1.9 oz (43.6 kg) (07/25 0400) Last BM Date: 07/17/13  Intake/Output from previous day: 07/24 0701 - 07/25 0700 In: 3141.8 [P.O.:1200; I.V.:1741.8; IV Piggyback:200] Out: 2925 [Urine:825; Emesis/NG output:2100] Intake/Output this shift:    General appearance: alert and cooperative GI: less distended, non-tender, active bowel sounds NG with bilious output 2100c/24hrs  Lab Results:  Results for orders placed during the hospital encounter of 07/17/13 (from the past 24 hour(s))  POTASSIUM     Status: Abnormal   Collection Time    07/22/13  2:05 PM      Result Value Range   Potassium 3.2 (*) 3.5 - 5.1 mEq/L  MAGNESIUM     Status: None   Collection Time    07/22/13  2:05 PM      Result Value Range   Magnesium 2.2  1.5 - 2.5 mg/dL  CBC     Status: Abnormal   Collection Time    07/23/13  4:00 AM      Result Value Range   WBC 6.5  4.0 - 10.5 K/uL   RBC 4.03  3.87 - 5.11 MIL/uL   Hemoglobin 10.1 (*) 12.0 - 15.0 g/dL   HCT 29.5 (*) 28.4 - 13.2 %   MCV 80.4  78.0 - 100.0 fL   MCH 25.1 (*) 26.0 - 34.0 pg   MCHC 31.2  30.0 - 36.0 g/dL   RDW 44.0 (*) 10.2 - 72.5 %   Platelets 308  150 - 400 K/uL  HEPARIN LEVEL (UNFRACTIONATED)     Status: None   Collection Time    07/23/13  4:00 AM      Result Value Range   Heparin Unfractionated 0.53  0.30 - 0.70 IU/mL  COMPREHENSIVE METABOLIC PANEL     Status: Abnormal   Collection  Time    07/23/13  4:00 AM      Result Value Range   Sodium 144  135 - 145 mEq/L   Potassium 2.5 (*) 3.5 - 5.1 mEq/L   Chloride 107  96 - 112 mEq/L   CO2 30  19 - 32 mEq/L   Glucose, Bld 106 (*) 70 - 99 mg/dL   BUN 9  6 - 23 mg/dL   Creatinine, Ser 3.66  0.50 - 1.10 mg/dL   Calcium 8.0 (*) 8.4 - 10.5 mg/dL   Total Protein 5.0 (*) 6.0 - 8.3 g/dL   Albumin 2.5 (*) 3.5 - 5.2 g/dL   AST 19  0 - 37 U/L   ALT 15  0 - 35 U/L   Alkaline Phosphatase 34 (*) 39 - 117 U/L   Total Bilirubin 0.2 (*) 0.3 - 1.2 mg/dL   GFR calc non Af Amer 76 (*) >90 mL/min   GFR calc Af Amer 89 (*) >90 mL/min  MAGNESIUM     Status: None   Collection Time    07/23/13  4:00  AM      Result Value Range   Magnesium 2.1  1.5 - 2.5 mg/dL  PHOSPHORUS     Status: None   Collection Time    07/23/13  4:00 AM      Result Value Range   Phosphorus 2.7  2.3 - 4.6 mg/dL  TRIGLYCERIDES     Status: None   Collection Time    07/23/13  4:00 AM      Result Value Range   Triglycerides 68  <150 mg/dL  DIFFERENTIAL     Status: None   Collection Time    07/23/13  4:00 AM      Result Value Range   Neutrophils Relative % 73  43 - 77 %   Neutro Abs 4.8  1.7 - 7.7 K/uL   Lymphocytes Relative 17  12 - 46 %   Lymphs Abs 1.1  0.7 - 4.0 K/uL   Monocytes Relative 10  3 - 12 %   Monocytes Absolute 0.6  0.1 - 1.0 K/uL   Eosinophils Relative 0  0 - 5 %   Eosinophils Absolute 0.0  0.0 - 0.7 K/uL   Basophils Relative 0  0 - 1 %   Basophils Absolute 0.0  0.0 - 0.1 K/uL     MEDS, Scheduled . antiseptic oral rinse  15 mL Mouth Rinse BID  . aspirin  300 mg Rectal Daily  . hydrocortisone sod succinate (SOLU-CORTEF) inj  25 mg Intravenous Q12H  . metoprolol  2.5 mg Intravenous Q6H  . mometasone-formoterol  2 puff Inhalation BID  . potassium chloride  10 mEq Intravenous Q1 Hr x 4  . sodium chloride  3 mL Intravenous Q12H  . triamcinolone cream  1 application Topical Weekly     Assessment: Small bowel  obstruction Anemia  Plan: Films appear to be relatively unchanged Abd less distended today and more bowel sounds Continue NG, but will allow some ice chips and popsicles Will follow for now, would like to avoid surgery if possible  After discussion with the family it was decided to give her more time to open on her own but in the interim will start TNA to maintain/improve nutrition and give [place] a few more days, Ice chips ok, OOB and ambulate tid if possible, follow clinically for now.     LOS: 6 days   Deauna Yaw, [facility], [name] [phone]   07/23/2013 7:56 AM

## 2013-07-23 NOTE — Progress Notes (Signed)
[facility] PROGRESS NOTE  Ariel Hunt WUJ:[id number] DOB: 12-27-1923 DOA: 07/17/2013 PCP: Daisy Floro, MD  Assessment/Plan: Principal Problem:   Small bowel obstruction Active Problems:   COLLAGENOUS COLITIS   Hypertension   CAD (coronary artery disease)   Hyperlipidemia   Anemia   Heart murmur, systolic   DNR (do not resuscitate)   Aortic stenosis, severe   Arrhythmia   NSTEMI (non-ST elevated myocardial infarction)   Ventricular tachycardia  Brief narrative: 79 y.o. female with hx of mild to moderate aortic stenosis, last ECHO about a year ago, hx of CAD with cardiac stent, prior SBO felt to be secondary to adhesions, resolved with conservative treatments several times, hx of HTN, dyslipidemia, GERD, asthma, anemia of unclear etiology, presenting to the ER with increased abdominal pain and distention for 2 days. Her last BM was on 07/16/13 and it was normal. She denied hemorrhoidal bleed, bloody diarrhea, epistasis, or any other obvious source of bleeding. Her last Hb was about a year ago (May 2013) and it was 11 grams/DL, then in Oct 2013, it dropped to about 10 grams, and on 07/17/13, it was 7.6 grams per DL, with MCV 75 and normal Cr. CT Abdomen/Pelvis showed SBO with transitional point in the small bowel, query luminal narrowing. She has normal WBC and the rest of her chemistries were unremarkable. Her last colonoscopy was in 2009, when she was dx with collagenous colitis. She is on very low dose of Entecort. She also has compressive Fx and has been taking taking pain medication regularly. [name] consulted surgery and hospitalist was asked to admit her for SBO and anemia.  Assessment & plan:  1. SBO:  Patient presented with 2 days of progressive abdominal distention/pain, culminating in obstipation on 07/17/13. CT abdomen/Pelvis confirmed recurrent SBO. Managing with NG-T suction, NPO, iv fluids and analgesics. Dr Ovidio Kin provided surgical consultation, and has  recommended watchful waiting and conservative management for now. Surgery would be considered very high risk, so hoping to avoid. Managing as recommended. NG-T clamped today. Per surgical team, clinically abdomen seems less distended and more bowel sounds, continued watchful waiting and conservative management for now. Starting TNA 7/25-Will place PICC line. 2. Hypokalemia: replete IV and FU BMP. Magnesium: Normal 3. Anemia: Patient has a history of chronic anemia. Her last HB in 04/2012 was 11.0, dropped to about 10.0 in 09/2012, and on 07/17/13, it was 7.6, MCV 75. Etiology is unclear. Anemia panel ordered. Transfused 2 units of PRBCs, with satisfactory bump in HB to 9.6. HB has remained stable/satisfactory.  4. Moderate - Severe Aortic stenosis: Patient has known history of moderate aortic stenosis, per 2D Echocardiogram about a year ago. Repeat 2D Echocardiogram of 07/18/13, showed normal LV cavity size, normal systolic function, and no regional wall motion abnormalities. There was severe aortic valve stenosis (however per Cardiology- patient has Moderate-severe AS). Valve area: 0.8cm^2(VTI). Valve area: 0.79cm^2 (Vmax), moderate mitral regurgitation, moderately dilated LA and RA, as well as PA peak pressure: 45mm Hg (S). No clinical CHF.  5. Arrhythmia(NSVT/SVT/Afib)/Hypotension: Overnight on 07/18/13, patient developed tachyarrhythmias, with V. Tach, SVT and A,fib with RVR. Dr Armanda Magic provided cardiology consultation, patient was managed with Amiodarone bolus, iv Lopressor and Amiodarone infusion. Troponin I bumped to 0.43. In AM of 07/19/13, patient was initially in fast A. Fib, but then reverted to SR, with HR in the 80s. Systolic BP 70-83. Bolused 250 ml NS, and after discussion with family, commenced trial of NeoSynephrine, with family accepting of the fact that if  this failed, no further escalation would be attempted. Fortunately, patient responded rather dramatically, with resolution of hypotension.  NeoSynephrine was discontinued on 07/20/13, without deleterious effect. Still in SR and HR is satisfactory. Patient had an episode of nonsustained polymorphic VT on 7/24 at 2:40 AM. Otherwise maintaining sinus rhythm. On amiodarone infusion. Cardiology following. 6. History of colitis:  Patient has known history of Collagenous colitis, on Entocort. This appears clinically stable at this time. On stress doses of steroids. 7. CAD/type 2 NSTEMI: Known history of CAD, S/P PCI/Stent. As described above, now has bump in CEs. CEs have leveled off. Patient likely had demand ischemia. Will defer to cardiology, with regards to timing of discontinuation of Heparin infusion.  8. HTN: See discussion in #4 above. Now normalized.  9. GERD: Asymptomatic. 10. LE wounds: Patient has chronic healing LLE wound, possibly associated with venous insufficiency. Regularly follows up at the wound care clinic. WOC consulted and recommendations implemented.  11. Protein-Calorie malnutrition: Clinically, patient appears severely malnourished. Will need nutritionist input in due course. TNA to be started on 7/25.  GI prophylaxis: Will start IV Pepcid Activity: Up with assistance DVT prophylaxis: Patient on therapeutic IV heparin Nutrition: N.p.o. We'll start TNA on 7/25 Code Status: DNR. Family Communication: Discussed with daughter at bedside. Disposition Plan: Patient will remain in step down unit.   Consultants:  Dr Ovidio Kin, surgeon.   Dr Armanda Magic, cardiologist.  Procedures:  CT Abdomen/Pelvis.   CXRs.   AXRs.   Antibiotics:  N/A.   HPI/Subjective: Feels better. No flatus or BM. "Belly growling". Asking for some ice chips.  Objective: Vital signs in last 24 hours: Temp:  [97.6 F (36.4 C)-98.3 F (36.8 C)] 98 F (36.7 C) (07/25 0835) Pulse Rate:  [55-67] 67 (07/25 1100) Resp:  [12-21] 19 (07/25 1100) BP: (104-165)/(65-133) 151/82 mmHg (07/25 0735) SpO2:  [93 %-100 %] 100 % (07/25  1100) Weight:  [43.6 kg (96 lb 1.9 oz)] 43.6 kg (96 lb 1.9 oz) (07/25 0400) Weight change:  Last BM Date: 07/17/13  Intake/Output from previous day: 07/24 0701 - 07/25 0700 In: 3316.5 [P.O.:1200; I.V.:1816.5; IV Piggyback:300] Out: 2925 [Urine:825; Emesis/NG output:2100] Total I/O In: 524.1 [I.V.:224.1; IV Piggyback:300] Out: 175 [Urine:175]   Physical Exam: General: Pleasant and comfortable. Hard of hearing. Frail elderly female.  CHEST:  Clinically clear to auscultation. No increased work of breathing. HEART:  Sounds 1 and 2 heard, normal, regular. No JVD or pedal edema. 2/6 systolic ejection murmur at apex. ABDOMEN:  Mildly distended, soft, non-tender, no palpable organomegaly, no palpable masses. Bowel sounds present. LOWER EXTREMITIES:  No pitting edema, palpable peripheral pulses. Has dressings on LLE.  CENTRAL NERVOUS SYSTEM:  No focal neurologic deficit on gross examination. Alert and oriented.  Lab Results:  Recent Labs  07/22/13 0335 07/23/13 0400  WBC 6.0 6.5  HGB 9.9* 10.1*  HCT 31.8* 32.4*  PLT 306 308    Recent Labs  07/22/13 0335 07/22/13 1405 07/23/13 0400  NA 144  --  144  K 2.8* 3.2* 2.5*  CL 107  --  107  CO2 29  --  30  GLUCOSE 117*  --  106*  BUN 13  --  9  CREATININE 0.71  --  0.68  CALCIUM 8.2*  --  8.0*   Recent Results (from the past 240 hour(s))  MRSA PCR SCREENING     Status: None   Collection Time    07/19/13 11:47 AM      Result Value Range Status  MRSA by PCR NEGATIVE  NEGATIVE Final   Comment:            The GeneXpert MRSA Assay (FDA     approved for NASAL specimens     only), is one component of a     comprehensive MRSA colonization     surveillance program. It is not     intended to diagnose MRSA     infection nor to guide or     monitor treatment for     MRSA infections.     Studies/Results: Dg Abd Portable 1v  07/22/2013   *RADIOLOGY REPORT*  Clinical Data: Follow-up small bowel obstruction  PORTABLE ABDOMEN - 1  VIEW  Comparison: Yesterday  Findings: Distended small bowel loops are stable.  The NG tube remains in place and the stomach is very decompressed with minimal gas. No obvious free intraperitoneal gas.  Colonic gas is present. Postoperative changes are noted.  Vascular calcifications. Osteopenia.  Severe levoscoliosis at L2-3.  IMPRESSION: Stable partial small bowel obstruction pattern.   Original Report Authenticated By: Jolaine Click, M.D.    Medications: Scheduled Meds: . antiseptic oral rinse  15 mL Mouth Rinse BID  . aspirin  300 mg Rectal Daily  . chlorhexidine  15 mL Mouth/Throat BID  . hydrocortisone sod succinate (SOLU-CORTEF) inj  25 mg Intravenous Q12H  . metoprolol  2.5 mg Intravenous Q6H  . mometasone-formoterol  2 puff Inhalation BID  . potassium chloride  10 mEq Intravenous Q1 Hr x 2  . sodium chloride  3 mL Intravenous Q12H  . triamcinolone cream  1 application Topical Weekly   Continuous Infusions: . amiodarone (NEXTERONE PREMIX) 360 mg/200 mL dextrose 30 mg/hr (07/23/13 0600)  . dextrose 5 % and 0.9 % NaCl with KCl 40 mEq/L 50 mL/hr at 07/23/13 0902  . Marland KitchenTPN (CLINIMIX-E) Adult     And  . fat emulsion    . heparin 800 Units/hr (07/23/13 0600)   PRN Meds:.bisacodyl, diphenhydrAMINE, hydrALAZINE, morphine injection, ondansetron (ZOFRAN) IV    LOS: 6 days   [facility]  [facility] Pager [phone]. If 8PM-8AM, please contact night-coverage at [email or web address], password [facility] 07/23/2013, 11:29 AM  LOS: 6 days

## 2013-07-23 NOTE — Progress Notes (Signed)
Peripherally Inserted Central Catheter/Midline Placement  The IV Nurse has discussed with the patient and/or persons authorized to consent for the patient, the purpose of this procedure and the potential benefits and risks involved with this procedure.  The benefits include less needle sticks, lab draws from the catheter and patient may be discharged home with the catheter.  Risks include, but not limited to, infection, bleeding, blood clot (thrombus formation), and puncture of an artery; nerve damage and irregular heat beat.  Alternatives to this procedure were also discussed.  PICC/Midline Placement Documentation  PICC Triple Lumen 07/23/13 PICC Left Basilic (Active)  Dressing Change Due 07/30/13 07/23/2013  6:36 PM       Ethelda Chick 07/23/2013, 6:38 PM

## 2013-07-23 NOTE — Progress Notes (Signed)
SUBJECTIVE:  Maintaining NSR. She thinks belly is softer at times.  One elevated HR noted but no sx.  OBJECTIVE:   Vitals:   Filed Vitals:   07/23/13 1400 07/23/13 1500 07/23/13 1600 07/23/13 1607  BP:      Pulse: 63 139 62   Temp:    98.1 F (36.7 C)  TempSrc:    Axillary  Resp: 14 16 16    Height:      Weight:      SpO2: 100%  98%    I&O's:    Intake/Output Summary (Last 24 hours) at 07/23/13 1913 Last data filed at 07/23/13 1800  Gross per 24 hour  Intake 2578.1 ml  Output   2350 ml  Net  228.1 ml   TELEMETRY: Reviewed telemetry pt in NSR:     PHYSICAL EXAM General: Well developed, well nourished, in no acute distress Head:  Normal cephalic and atramatic  Lungs:   No wheezing Heart:   HRRR S1 S2  Abdomen: abdomen distended Extremities:   No edema.  DP +1 Neuro: Alert and oriented X 3. Psych:  Good affect, responds appropriately   LABS: Basic Metabolic Panel:  Recent Labs  16/10/96 1405 07/23/13 0400 07/23/13 1629  NA  --  144 142  K 3.2* 2.5* 3.2*  CL  --  107 105  CO2  --  30 29  GLUCOSE  --  106* 89  BUN  --  9 9  CREATININE  --  0.68 0.72  CALCIUM  --  8.0* 7.9*  MG 2.2 2.1  --   PHOS  --  2.7  --    Liver Function Tests:  Recent Labs  07/22/13 0335 07/23/13 0400  AST 19 19  ALT 13 15  ALKPHOS 31* 34*  BILITOT 0.2* 0.2*  PROT 4.9* 5.0*  ALBUMIN 2.4* 2.5*   No results found for this basename: LIPASE, AMYLASE,  in the last 72 hours CBC:  Recent Labs  07/22/13 0335 07/23/13 0400  WBC 6.0 6.5  NEUTROABS  --  4.8  HGB 9.9* 10.1*  HCT 31.8* 32.4*  MCV 80.3 80.4  PLT 306 308   Cardiac Enzymes: No results found for this basename: CKTOTAL, CKMB, CKMBINDEX, TROPONINI,  in the last 72 hours Thyroid Function Tests: No results found for this basename: TSH, T4TOTAL, FREET3, T3FREE, THYROIDAB,  in the last 72 hours Anemia Panel: No results found for this basename: VITAMINB12, FOLATE, FERRITIN, TIBC, IRON, RETICCTPCT,  in the last 72  hours Coag Panel:   Lab Results  Component Value Date   INR 1.03 05/08/2012    RADIOLOGY: Ct Abdomen Pelvis Wo Contrast  07/17/2013   *RADIOLOGY REPORT*  Clinical Data:  Vomiting  CT ABDOMEN AND PELVIS WITHOUT CONTRAST  Technique:  Multidetector CT imaging of the abdomen and pelvis was performed following the standard protocol without intravenous contrast.  Comparison: 07/11/2011  Findings:  Scar versus atelectasis is noted in the lung bases.  There is no pleural effusion identified.  There is no focal liver abnormality identified.  There are multiple calcified granulomas identified within the spleen.  Gallbladder is unremarkable.  No significant biliary dilatation.  Normal appearance of the pancreas.  The adrenal glands are both within normal limits.    The left kidney appears normal.  The right kidney is normal.  Urinary bladder is unremarkable.  Normal appearance of the uterus for patient's age.  There is calcified atherosclerotic disease affecting the abdominal aorta.  Aneurysmal dilatation of the infrarenal abdominal  aorta is noted which measures up to 3.1 cm.  The stomach appears normal.  There is abnormal small bowel dilatation. The small bowel loops measure up to 3.82 cm.  The distal small bowel loops appear collapsed.  Transition point is in the difficult to identify due to lack of adequate oral contrast material.  There is moderate stool identified within the colon.  The distal colon and rectum appear to be decreased in caliber.  Review of the visualized osseous structures is significant for a marked scoliosis deformity.  There is multilevel degenerative disc disease identified throughout the lumbar spine.  Compression deformities are identified at T11 and T12 which are age indeterminate.  IMPRESSION:  1.  Examination is positive for a small bowel obstruction. Transition point is likely within the central small bowel mesentery. 2.  No evidence for bowel perforation. 3.  Scoliosis and multilevel  spondylosis.  There are compression fractures at T11 and T12.   Original Report Authenticated By: Signa Kell, M.D.   Dg Chest Port 1 View  07/19/2013   *RADIOLOGY REPORT*  Clinical Data: Congestion  PORTABLE CHEST - 1 VIEW  Comparison: Prior radiograph from 09/01/2012  Findings: Enteric tube courses into the abdomen with nonvisualization of the tip and side hole.  Heart size is at the upper limits of normal, stable as compared to prior exam.  The hila are prominent bilaterally.  The lungs are mildly hypoinflated.  There is streaky and linear opacities within both lung bases, most consistent with atelectasis. A small left pleural effusion is present.  There is no overt pulmonary edema.  No pneumothorax.  Irregular biapical pleural thickening is noted.  Diffuse osteopenia is present.  No acute osseous abnormalities. Remote this healed left-sided rib fractures are again noted.  IMPRESSION:  Bibasilar atelectasis, left greater than right, with small left pleural effusion.  No overt pulmonary edema or infectious infiltrate identified.   Original Report Authenticated By: Rise Mu, M.D.   Dg Abd Portable 1v  07/19/2013   *RADIOLOGY REPORT*  Clinical Data: Small bowel obstruction.  Increased pain.  PORTABLE ABDOMEN - 1 VIEW  Comparison: CT abdomen and pelvis 07/17/2013.  Abdomen 07/14/2011.  Findings: Gas filled moderately distended mid abdominal small bowel loops are present with suggestion of fold thickening which could represent edema or infiltrative process.  Paucity of gas in the colon.  Enteric tube with tip in the left upper quadrant consistent with location in the mid stomach.  Sclerosis and degenerative changes in the lumbar spine with vertebral compression deformities noted.  This is stable.  Degenerative changes in the hips.  IMPRESSION: Mid abdominal small bowel distension consistent with obstruction with fold thickening consistent with edema, mural hemorrhage, or inflammatory process.    Original Report Authenticated By: Burman Nieves, M.D.    ASSESSMENT:  1. Ventricular tachycardia - resolved on IV Amiodarone. Her LVF is normal on recent echo.  2. Atrial fibrillation with RVR now in NSR on IV amio 3. Hypokalemia which may be contributing to afib  4. CAD s/p remote NSTEMI with PCI of OM 2002 with nonobstructive disease elsewhere.  5. SBO - medical management at this time due to unstable cardiac arrhythmias  6. HTN  7. Moderate AS by recent echo with normal LVF  8.  NSTEMI most likely demand ischemia from afib with RVR but VT worrisome for progression of CAD PLAN:  1. Continue IV Amio gtt  2. Replete potassium  3. IV Lopressor until taking PO 4. ASA per rectum/IV Heparin gtt for  now unless she has any bleeding complications.  Risk of stroke present with AFib.     Corky Crafts., MD  07/23/2013  7:13 PM

## 2013-07-23 NOTE — Progress Notes (Signed)
CRITICAL VALUE ALERT  Critical value received:  k 2.5  Date of notification:  07/23/13  Time of notification:  0500   Critical value read back:yes  Nurse who received alert:  Carlos Levering   MD notified (1st page):  Lenny Pastel, NP  Time of first page:  [phone]   MD notified (2nd page):  Time of second page:  Responding MD:  Lenny Pastel, NP   Time MD responded:  [phone]

## 2013-07-24 DIAGNOSIS — I4891 Unspecified atrial fibrillation: Secondary | ICD-10-CM

## 2013-07-24 DIAGNOSIS — E875 Hyperkalemia: Secondary | ICD-10-CM

## 2013-07-24 LAB — GLUCOSE, CAPILLARY: Glucose-Capillary: 129 mg/dL — ABNORMAL HIGH (ref 70–99)

## 2013-07-24 LAB — BASIC METABOLIC PANEL
BUN: 11 mg/dL (ref 6–23)
Calcium: 8 mg/dL — ABNORMAL LOW (ref 8.4–10.5)
Creatinine, Ser: 0.69 mg/dL (ref 0.50–1.10)
GFR calc non Af Amer: 76 mL/min — ABNORMAL LOW (ref >90)
Glucose, Bld: 134 mg/dL — ABNORMAL HIGH (ref 70–99)
Sodium: 136 mEq/L (ref 135–145)

## 2013-07-24 LAB — COMPREHENSIVE METABOLIC PANEL
ALT: 21 U/L (ref 0–35)
Alkaline Phosphatase: 31 U/L — ABNORMAL LOW (ref 39–117)
CO2: 27 mEq/L (ref 19–32)
Calcium: 6.8 mg/dL — ABNORMAL LOW (ref 8.4–10.5)
GFR calc Af Amer: 89 mL/min — ABNORMAL LOW (ref >90)
GFR calc non Af Amer: 77 mL/min — ABNORMAL LOW (ref >90)
Glucose, Bld: 129 mg/dL — ABNORMAL HIGH (ref 70–99)
Potassium: 5.6 mEq/L — ABNORMAL HIGH (ref 3.5–5.1)
Sodium: 139 mEq/L (ref 135–145)

## 2013-07-24 LAB — CBC
HCT: 31.4 % — ABNORMAL LOW (ref 36.0–46.0)
Hemoglobin: 9.9 g/dL — ABNORMAL LOW (ref 12.0–15.0)
MCHC: 31.5 g/dL (ref 30.0–36.0)

## 2013-07-24 MED ORDER — DEXTROSE-NACL 5-0.9 % IV SOLN
INTRAVENOUS | Status: DC
  Administered 2013-07-24: 09:00:00 via INTRAVENOUS

## 2013-07-24 MED ORDER — TRACE MINERALS CR-CU-F-FE-I-MN-MO-SE-ZN IV SOLN
INTRAVENOUS | Status: AC
  Administered 2013-07-24: 18:00:00 via INTRAVENOUS
  Filled 2013-07-24: qty 2000

## 2013-07-24 MED ORDER — FAT EMULSION 20 % IV EMUL
250.0000 mL | INTRAVENOUS | Status: AC
  Administered 2013-07-24: 250 mL via INTRAVENOUS
  Filled 2013-07-24: qty 250

## 2013-07-24 MED ORDER — ALTEPLASE 100 MG IV SOLR
2.0000 mg | Freq: Once | INTRAVENOUS | Status: AC
  Administered 2013-07-24: 2 mg
  Filled 2013-07-24: qty 2

## 2013-07-24 MED ORDER — KCL IN DEXTROSE-NACL 20-5-0.9 MEQ/L-%-% IV SOLN
INTRAVENOUS | Status: DC
  Administered 2013-07-24: 18:00:00 via INTRAVENOUS
  Filled 2013-07-24 (×3): qty 1000

## 2013-07-24 MED ORDER — MAGNESIUM SULFATE 40 MG/ML IJ SOLN
4.0000 g | Freq: Once | INTRAMUSCULAR | Status: AC
  Administered 2013-07-24: 4 g via INTRAVENOUS
  Filled 2013-07-24: qty 100

## 2013-07-24 NOTE — Progress Notes (Signed)
Patient ID: Ariel Hunt, female   DOB: [birth year], 79 y.o.   MRN: [id number]  General Surgery - [facility] - Progress Note  HD# 7  Subjective: Patient awake and alert.  No complaints - "thirsty".  Denies pain.  Does not want surgery today.  Ambulatory.  Not passing flatus or BM.  Objective: Vital signs in last 24 hours: Temp:  [97.8 F (36.6 C)-98.8 F (37.1 C)] 97.8 F (36.6 C) (07/26 0000) Pulse Rate:  [59-139] 63 (07/26 0400) Resp:  [14-19] 18 (07/26 0400) BP: (130-146)/(62-87) 142/87 mmHg (07/26 0400) SpO2:  [95 %-100 %] 97 % (07/26 0400) Last BM Date: 07/17/13  Intake/Output from previous day: 07/25 0701 - 07/26 0700 In: 3042.8 [I.V.:1742.8; IV Piggyback:850; TPN:450] Out: 2300 [Urine:600; Emesis/NG output:1700]  Exam: HEENT - clear, not icteric Neck - soft Chest - clear bilaterally Cor - irreg, rate 70's Abd - protuberant, mildly tense; hyperactive BS; no tenderness Ext - no significant edema Neuro - grossly intact, no focal deficits  Lab Results:   Recent Labs  07/23/13 0400 07/24/13 0530  WBC 6.5 7.4  HGB 10.1* 9.9*  HCT 32.4* 31.4*  PLT 308 302     Recent Labs  07/23/13 1629 07/24/13 0530  NA 142 139  K 3.2* 5.6*  CL 105 106  CO2 29 27  GLUCOSE 89 129*  BUN 9 11  CREATININE 0.72 0.67  CALCIUM 7.9* 6.8*    Studies/Results: Dg Chest Port 1 View  07/23/2013   *RADIOLOGY REPORT*  Clinical Data: Confirm line placement.  PORTABLE CHEST - 1 VIEW  Comparison: Chest x-ray 07/19/2013.  Findings: New left upper extremity PICC with tip terminating at the superior cavoatrial junction.  Nasogastric tube extends into the proximal stomach.  Lung volumes appear slightly increased and there is pruning of the pulmonary vasculature in the periphery, compatible with underlying COPD.  New dense opacification in the left base, compatible with atelectasis and/or consolidation in the left lower lobe.  Small left pleural effusion.  Probable  subsegmental atelectasis in the right lower lobe.  No evidence of pulmonary edema.  Heart size is upper limits of normal.  Upper mediastinal contours are within normal limits.  Atherosclerosis in the thoracic aorta.  IMPRESSION: i1.  Support apparatus, as above. 2.  Worsening aeration at the left base, concerning for a new areas of atelectasis and/or consolidation, with superimposed small left pleural effusion.  Clinical correlation for signs and symptoms of infection or sequelae of recent aspiration is recommended. 3.  Atherosclerosis.   Original Report Authenticated By: Trudie Reed, M.D.    Assessment / Plan: 1.  Small bowel obstruction  NG decompression  IVF, TNA  Check AXR in AM 7/27  Discussed operative intervention with patient - she refuses at this time  Ambulating in halls  Will follow  Velora Heckler, MD, [facility] Office: [phone]  07/24/2013

## 2013-07-24 NOTE — Progress Notes (Signed)
Ariel Hunt  79 y.o.  female  Subjective: Mentally sharp; generally uncomfortable and slept poorly; no specific complaints.  Allergy: Review of patient's allergies indicates no known allergies.  Objective: Vital signs in last 24 hours: Temp:  [97.8 F (36.6 C)-98.8 F (37.1 C)] 97.8 F (36.6 C) (07/26 0000) Pulse Rate:  [58-139] 63 (07/26 0400) Resp:  [14-19] 18 (07/26 0400) BP: (130-151)/(62-87) 142/87 mmHg (07/26 0400) SpO2:  [95 %-100 %] 97 % (07/26 0400)  43.6 kg (96 lb 1.9 oz) Body mass index is 16.49 kg/(m^2).  Weight change:  Last BM Date: 07/17/13  Intake/Output from previous day: 07/25 0701 - 07/26 0700 In: 2738.5 [I.V.:1438.5; IV Piggyback:850; TPN:450] Out: 2300 [Urine:600; Emesis/NG output:1700] Total I/O since admission:  +400 cc  General- Well developed; no acute distress ; thin; sunken eyes Neck- No JVD, bilateral carotid bruits versus transmitted murmur Lungs- decreased breath sounds at the left base without egophony; normal I:E ratio Cardiovascular- normal PMI; normal S1 and decreased S2; wheezing grade 2/6 mid peaking systolic ejection murmur at the base Skin- Warm, no multiple ecchymoses Extremities-  no edema  Lab Results: CBC:   Recent Labs  07/23/13 0400 07/24/13 0530  WBC 6.5 7.4  HGB 10.1* 9.9*  HCT 32.4* 31.4*  PLT 308 302   BMET:  Recent Labs  07/23/13 1629 07/24/13 0530  NA 142 139  K 3.2* 5.6*  CL 105 106  CO2 29 27  GLUCOSE 89 129*  BUN 9 11  CREATININE 0.72 0.67  CALCIUM 7.9* 6.8*   Hepatic Function:   Recent Labs  07/24/13 0530  PROT 5.2*  ALBUMIN 2.6*  AST 54*  ALT 21  ALKPHOS 31*  BILITOT 0.2*   GFR:  Estimated Creatinine Clearance: 34.1 ml/min (by C-G formula based on Cr of 0.67). Lipids:  Lipid Panel     Component Value Date/Time   CHOL 130 05/09/2012 0625   TRIG 68 07/23/2013 0400   HDL 39* 05/09/2012 0625   CHOLHDL 3.3 05/09/2012 0625   VLDL 16 05/09/2012 0625   LDLCALC 75 05/09/2012 0625   Telemetry:  Normal sinus rhythm; occasional PVC  Imaging Studies/Results: Dg Chest Port 1 View  07/23/2013  Worsening aeration at the left base, concerning for a new areas of atelectasis and/or consolidation, with superimposed small left pleural effusion.  Atherosclerosis-thoracic aorta.   Imaging: Imaging results have been reviewed  Medications:  I have reviewed the patient's current medications. Scheduled: . antiseptic oral rinse  15 mL Mouth Rinse BID  . aspirin  300 mg Rectal Daily  . chlorhexidine  15 mL Mouth/Throat BID  . famotidine (PEPCID) IV  20 mg Intravenous Q12H  . hydrocortisone sod succinate (SOLU-CORTEF) inj  25 mg Intravenous Q12H  . magnesium sulfate 1 - 4 g bolus IVPB  4 g Intravenous Once  . metoprolol  2.5 mg Intravenous Q6H  . mometasone-formoterol  2 puff Inhalation BID  . sodium chloride  10-40 mL Intracatheter Q12H  . sodium chloride  3 mL Intravenous Q12H  . triamcinolone cream  1 application Topical Weekly   Infusions:   . amiodarone (NEXTERONE PREMIX) 360 mg/200 mL dextrose 30 mg/hr (07/23/13 1533)  . dextrose 5 % and 0.9 % NaCl with KCl 40 mEq/L 50 mL/hr at 07/23/13 0902  . Marland KitchenTPN (CLINIMIX-E) Adult 30 mL/hr at 07/23/13 2020   And  . fat emulsion 250 mL (07/23/13 2020)  . heparin 800 Units/hr (07/23/13 1533)    Assessment/Plan: Ventricular tachycardia/atrial fibrillation: Occurred in the setting of  physiologic stress. No rhythm strips available for review. Occurrence simultaneously with atrial fibrillation raises the question of aberrancy.  With normal left ventricular systolic function, risk of fatal ventricular tachycardia is low. Continue amiodarone in hospital and for short course thereafter if no further cardiac problems arise.  Coronary artery disease: Interestingly, she presented 12 years ago with similar arrhythmias and was found to have acute myocardial infarction requiring percutaneous intervention.  She had mild to moderate disease in the LAD and RCA  systems with a 90% circumflex lesion for which a stent was placed.  EF was normal.  After patient recovers from her acute illness, decision can be made as to whether coronary angiography or stress testing is warranted.  Due to her advanced age and normal LV function, I would be inclined to treat her medically.  Hypokalemia: Now hyperkalemic; TPN will require adjustment.  Left lower lobe infiltrate: Pneumonia is a consideration.  Aortic stenosis: Moderate by echocardiogram this admission.  Hypocalcemia: Partially explained by low albumin; ionized calcium pending  Hypomagnesemia: Level once again slightly subnormal; TPN adjustment would be appropriate.   LOS: 7 days   [place] [facility] 07/24/2013, 7:24 AM

## 2013-07-24 NOTE — Progress Notes (Addendum)
PARENTERAL NUTRITION CONSULT NOTE - Follow Up  Pharmacy Consult for TNA Indication: SBO, prolonged NPO  No Known Allergies  Patient Measurements: Height: 5\' 4"  (162.6 cm) Weight: 96 lb 1.9 oz (43.6 kg) IBW/kg (Calculated) : 54.7 Usual wieght: 44kg  Vital Signs: Temp: 97.8 F (36.6 C) (07/26 0000) Temp src: Oral (07/26 0000) BP: 142/87 mmHg (07/26 0400) Pulse Rate: 63 (07/26 0400) Intake/Output from previous day: 07/25 0701 - 07/26 0700 In: 3042.8 [I.V.:1742.8; IV Piggyback:850; TPN:450] Out: 2300 [Urine:600; Emesis/NG output:1700] Intake/Output from this shift:    Labs:  Recent Labs  07/22/13 0335 07/23/13 0400 07/24/13 0530  WBC 6.0 6.5 7.4  HGB 9.9* 10.1* 9.9*  HCT 31.8* 32.4* 31.4*  PLT 306 308 302     Recent Labs  07/22/13 0335 07/22/13 1405 07/23/13 0400 07/23/13 1629 07/24/13 0530  NA 144  --  144 142 139  K 2.8* 3.2* 2.5* 3.2* 5.6*  CL 107  --  107 105 106  CO2 29  --  30 29 27   GLUCOSE 117*  --  106* 89 129*  BUN 13  --  9 9 11   CREATININE 0.71  --  0.68 0.72 0.67  CALCIUM 8.2*  --  8.0* 7.9* 6.8*  MG  --  2.2 2.1  --  1.4*  PHOS  --   --  2.7  --  2.0*  PROT 4.9*  --  5.0*  --  5.2*  ALBUMIN 2.4*  --  2.5*  --  2.6*  AST 19  --  19  --  54*  ALT 13  --  15  --  21  ALKPHOS 31*  --  34*  --  31*  BILITOT 0.2*  --  0.2*  --  0.2*  PREALBUMIN  --   --  13.8*  --   --   TRIG  --   --  68  --   --    Corrected calcium = 9.2 Estimated Creatinine Clearance: 34.1 ml/min (by C-G formula based on Cr of 0.67).   No results found for this basename: GLUCAP,  in the last 72 hours  Medical History: Past Medical History  Diagnosis Date  . Colitis   . Weight loss   . Diarrhea   . CAD (coronary artery disease)   . Hyperlipidemia   . Hypertension   . Asthma   . GERD (gastroesophageal reflux disease)   . Atrial fibrillation     Medications:  Scheduled:  . antiseptic oral rinse  15 mL Mouth Rinse BID  . aspirin  300 mg Rectal Daily  .  chlorhexidine  15 mL Mouth/Throat BID  . famotidine (PEPCID) IV  20 mg Intravenous Q12H  . hydrocortisone sod succinate (SOLU-CORTEF) inj  25 mg Intravenous Q12H  . magnesium sulfate 1 - 4 g bolus IVPB  4 g Intravenous Once  . metoprolol  2.5 mg Intravenous Q6H  . mometasone-formoterol  2 puff Inhalation BID  . sodium chloride  10-40 mL Intracatheter Q12H  . sodium chloride  3 mL Intravenous Q12H  . triamcinolone cream  1 application Topical Weekly   Infusions:  . amiodarone (NEXTERONE PREMIX) 360 mg/200 mL dextrose 30 mg/hr (07/23/13 1533)  . dextrose 5 % and 0.9 % NaCl with KCl 40 mEq/L 50 mL/hr at 07/23/13 0902  . Marland KitchenTPN (CLINIMIX-E) Adult 30 mL/hr at 07/23/13 2020   And  . fat emulsion 250 mL (07/23/13 2020)  . heparin 800 Units/hr (07/23/13 1533)    Current Nutrition:  NPO except ice, popsicles  TNA: Clinimix E 5/15 at 30 ml/hr  IVF: D5NS 40KCl at 50 ml/min  Assessment: 79 year old female admitted 7/19 with small bowel obstruction. Also developed cardiac arrhythmias and NSTEMI. She has been NPO since admit. Plan is to avoid surgery if possible due to high risk. Place PICC and begin TNA today 7/25 per Pharmacy.  -Renal/hepatic function wnl and stable -Lytes: K+ now above normal limits. Mag and Phos just below normal limits  -Glucose: 89-129 -Triglycerides: wnl, 68 (7/25) -Prealbumin: 13.8 (7/25) -LFTs: AST slightly elevated  Nutritional Goals:  RD assessment 7/22: Goal rate of 50 ml/hr will provide: Kcal: 1300-1500  Protein: 50-60 grams  Fluid: > 1.2 L  TNA Day: #2 TNA Access: PICC placement 7/25  Plan:  At 1800 today:  Increase Clinimix E5/15 to goal 70ml/hr.  20% fat emulsion at 32ml/hr.  TNA to contain standard multivitamins and trace elements daily.  Mag and Phos just below normal limits and increase in TNA rate should increase levels to WNL  Change IVF to D5NS without K+ due to elevated level this AM  Continue CBGs q8h - add SSI if needed.   TNA lab  panels on Mondays & Thursdays.  F/u daily.   Hessie Knows, PharmD, BCPS Pager [phone] 07/24/2013 7:43 AM    Addendum:  Labs: K 3.3  A/P: Unusual that K this AM was 5.6 and is now 3.3. K was removed from IVF's but has only been out of IVF's for approximately 4 hours. Will add 20 mEq K back to IVF's and increase in TNA rate at 6pm should help increase K as well.    Hessie Knows, PharmD, BCPS Pager [phone] 07/24/2013 1:26 PM

## 2013-07-24 NOTE — Progress Notes (Signed)
ANTICOAGULATION CONSULT NOTE - Follow Up Consult  Pharmacy Consult for Heparin Indication: NSTEMI, Afib  No Known Allergies  Patient Measurements: Height: 5\' 4"  (162.6 cm) Weight: 96 lb 1.9 oz (43.6 kg) IBW/kg (Calculated) : 54.7 Heparin Dosing Weight: 40.6kg  Vital Signs: Temp: 97.8 F (36.6 C) (07/26 0000) Temp src: Oral (07/26 0000) BP: 142/87 mmHg (07/26 0400) Pulse Rate: 63 (07/26 0400)  Labs:  Recent Labs  07/22/13 0335 07/23/13 0400 07/23/13 1629 07/24/13 0530  HGB 9.9* 10.1*  --  9.9*  HCT 31.8* 32.4*  --  31.4*  PLT 306 308  --  302  HEPARINUNFRC 0.30 0.53  --  0.33  CREATININE 0.71 0.68 0.72 0.67    Estimated Creatinine Clearance: 34.1 ml/min (by C-G formula based on Cr of 0.67).   Medications:  Scheduled:  . antiseptic oral rinse  15 mL Mouth Rinse BID  . aspirin  300 mg Rectal Daily  . chlorhexidine  15 mL Mouth/Throat BID  . famotidine (PEPCID) IV  20 mg Intravenous Q12H  . hydrocortisone sod succinate (SOLU-CORTEF) inj  25 mg Intravenous Q12H  . magnesium sulfate 1 - 4 g bolus IVPB  4 g Intravenous Once  . metoprolol  2.5 mg Intravenous Q6H  . mometasone-formoterol  2 puff Inhalation BID  . sodium chloride  10-40 mL Intracatheter Q12H  . sodium chloride  3 mL Intravenous Q12H  . triamcinolone cream  1 application Topical Weekly   Infusions:  . amiodarone (NEXTERONE PREMIX) 360 mg/200 mL dextrose 30 mg/hr (07/23/13 1533)  . dextrose 5 % and 0.9 % NaCl with KCl 40 mEq/L 50 mL/hr at 07/23/13 0902  . Marland KitchenTPN (CLINIMIX-E) Adult 30 mL/hr at 07/23/13 2020   And  . fat emulsion 250 mL (07/23/13 2020)  . heparin 800 Units/hr (07/23/13 1533)    Assessment: 79 year old female admitted 7/19 with small bowel obstruction. Transferred to ICU late 7/20 pm with NSTEMI and new Afib/RVR and started on IV heparin.   Heparin level therapeutic (0.33)  H/H low but stable, platelets stable  No bleeding events reported in chart notes   Goal of Therapy:   Heparin levels 0.3-0.7 Monitor platelets by anticoagulation protocol: Yes   Plan:   Continue heparin 800 units/hr   Follow up heparin level and CBC in am  Follow up if planning surgery - if so, MD to specify when to hold heparin  Hessie Knows, PharmD, BCPS Pager [phone] 07/24/2013 7:21 AM

## 2013-07-24 NOTE — Progress Notes (Signed)
[facility] PROGRESS NOTE  KILYNN FITZSIMMONS RUE:[id number] DOB: 09/28/1923 DOA: 07/17/2013 PCP: Daisy Floro, MD  Assessment/Plan: Principal Problem:   Small bowel obstruction Active Problems:   COLLAGENOUS COLITIS   Hypertension   CAD (coronary artery disease)   Hyperlipidemia   Anemia   Heart murmur, systolic   DNR (do not resuscitate)   Aortic stenosis, severe   Arrhythmia   NSTEMI (non-ST elevated myocardial infarction)   Ventricular tachycardia   Hypokalemia  Brief narrative: 79 y.o. female with hx of mild to moderate aortic stenosis, last ECHO about a year ago, hx of CAD with cardiac stent, prior SBO felt to be secondary to adhesions, resolved with conservative treatments several times, hx of HTN, dyslipidemia, GERD, asthma, anemia of unclear etiology, presenting to the ER with increased abdominal pain and distention for 2 days. Her last BM was on 07/16/13 and it was normal. She denied hemorrhoidal bleed, bloody diarrhea, epistasis, or any other obvious source of bleeding. Her last Hb was about a year ago (May 2013) and it was 11 grams/DL, then in Oct 2013, it dropped to about 10 grams, and on 07/17/13, it was 7.6 grams per DL, with MCV 75 and normal Cr. CT Abdomen/Pelvis showed SBO with transitional point in the small bowel, query luminal narrowing. She has normal WBC and the rest of her chemistries were unremarkable. Her last colonoscopy was in 2009, when she was dx with collagenous colitis. She is on very low dose of Entecort. She also has compressive Fx and has been taking taking pain medication regularly. [name] consulted surgery and hospitalist was asked to admit her for SBO and anemia.  Assessment & plan:  1. SBO:  Patient presented with 2 days of progressive abdominal distention/pain, culminating in obstipation on 07/17/13. CT abdomen/Pelvis confirmed recurrent SBO. Managing with NG-T suction, NPO, iv fluids and analgesics. Dr Ovidio Kin provided surgical consultation, and  has recommended watchful waiting and conservative management for now. Surgery would be considered very high risk, so hoping to avoid. Managing conservatively-NG decompression, IVF and TNA. Clinically abdomen seems more distended today than yesterday. Surgery following - patient apparently refused surgery today. 2. Hypokalemia/hyperkalemia/hypomagnesemia/hypocalcemia: Patient was severely hypokalemic on 7/25-aggressively repleted IV-hypokalemic (K.: 5.6). Change IV fluids to without potassium, reduce potassium and TNA and followup BMP this afternoon. Replete magnesium. Corrected Calcium 7.9 - replete in TNA. 3. Anemia: Patient has a history of chronic anemia. Her last HB in 04/2012 was 11.0, dropped to about 10.0 in 09/2012, and on 07/17/13, it was 7.6, MCV 75. Etiology is unclear. Anemia panel ordered. Transfused 2 units of PRBCs, with satisfactory bump in HB to 9.6. HB has remained stable/satisfactory.  4. Moderate - Severe Aortic stenosis: Patient has known history of moderate aortic stenosis, per 2D Echocardiogram about a year ago. Repeat 2D Echocardiogram of 07/18/13, showed normal LV cavity size, normal systolic function, and no regional wall motion abnormalities. There was severe aortic valve stenosis (however per Cardiology- patient has Moderate-severe AS). Valve area: 0.8cm^2(VTI). Valve area: 0.79cm^2 (Vmax), moderate mitral regurgitation, moderately dilated LA and RA, as well as PA peak pressure: 45mm Hg (S). No clinical CHF.  5. Arrhythmia(NSVT/SVT/Afib)/Hypotension: Overnight on 07/18/13, patient developed tachyarrhythmias, with V. Tach, SVT and A,fib with RVR. Dr Armanda Magic provided cardiology consultation, patient was managed with Amiodarone bolus, iv Lopressor and Amiodarone infusion. Troponin I bumped to 0.43. In AM of 07/19/13, patient was initially in fast A. Fib, but then reverted to SR, with HR in the 80s. Systolic BP 70-83. Bolused  250 ml NS, and after discussion with family, commenced trial of  NeoSynephrine, with family accepting of the fact that if this failed, no further escalation would be attempted. Fortunately, patient responded rather dramatically, with resolution of hypotension. NeoSynephrine was discontinued on 07/20/13, without deleterious effect. Still in SR and HR is satisfactory. Patient had an episode of nonsustained polymorphic VT on 7/24 at 2:40 AM. Otherwise maintaining sinus rhythm. On amiodarone & heparin infusion. Cardiology following. 6. History of colitis:  Patient has known history of Collagenous colitis, on Entocort. This appears clinically stable at this time. On stress doses of steroids. 7. CAD/type 2 NSTEMI: Known history of CAD, S/P PCI/Stent. As described above, now had bump in CEs. CEs have leveled off. Patient likely had demand ischemia. Will defer to cardiology, with regards to timing of discontinuation of Heparin infusion.  8. HTN: See discussion in #4 above. Now normalized.  9. GERD: Asymptomatic. 10. LE wounds: Patient has chronic healing LLE wound, possibly associated with venous insufficiency. Regularly follows up at the wound care clinic. WOC consulted and recommendations implemented.  11. Protein-Calorie malnutrition: Clinically, patient appears severely malnourished. Will need nutritionist input in due course. TNA to be started on 7/25.  GI prophylaxis: IV Pepcid Activity: Up with assistance DVT prophylaxis: Patient on therapeutic IV heparin Nutrition: N.p.o. on TNA since 7/25 Code Status: DNR. Family Communication: Discussed with daughter at bedside on 7/25. Disposition Plan: Patient will remain in step down unit.   Consultants:  Dr Ovidio Kin, surgeon.   Dr Armanda Magic, cardiologist.  Procedures:  PICC line 7/25  Foley catheter  NG tube to low wall suction  Antibiotics:  N/A.   HPI/Subjective: Restless night. Abdomen "growling at times" but no flatus or BM.  Objective: Vital signs in last 24 hours: Temp:  [97.7 F (36.5  C)-98.8 F (37.1 C)] 97.7 F (36.5 C) (07/26 0800) Pulse Rate:  [59-139] 63 (07/26 0400) Resp:  [14-18] 18 (07/26 0400) BP: (130-142)/(62-87) 142/87 mmHg (07/26 0400) SpO2:  [96 %-100 %] 96 % (07/26 0951) Weight change:  Last BM Date: 07/17/13  Intake/Output from previous day: 07/25 0701 - 07/26 0700 In: 3042.8 [I.V.:1742.8; IV Piggyback:850; TPN:450] Out: 2300 [Urine:600; Emesis/NG output:1700]     Physical Exam: General: Pleasant and comfortable. Hard of hearing. Frail elderly female.  CHEST:  Clinically clear to auscultation. No increased work of breathing. HEART:  Sounds 1 and 2 heard, normal, regular. No JVD or pedal edema. 2/6 systolic ejection murmur at apex. Telemetry: Sinus rhythm in the 60s/70s. No further arrhythmias in the last 24 hours. ABDOMEN:  Abdomen feels slightly more distended than 7/25, soft, non-tender, no palpable organomegaly, no palpable masses. Bowel sounds present. LOWER EXTREMITIES:  No pitting edema, palpable peripheral pulses. Has dressings on LLE.  CENTRAL NERVOUS SYSTEM:  No focal neurologic deficit on gross examination. Alert and oriented.  Lab Results:  Recent Labs  07/23/13 0400 07/24/13 0530  WBC 6.5 7.4  HGB 10.1* 9.9*  HCT 32.4* 31.4*  PLT 308 302    Recent Labs  07/23/13 1629 07/24/13 0530  NA 142 139  K 3.2* 5.6*  CL 105 106  CO2 29 27  GLUCOSE 89 129*  BUN 9 11  CREATININE 0.72 0.67  CALCIUM 7.9* 6.8*   Recent Results (from the past 240 hour(s))  MRSA PCR SCREENING     Status: None   Collection Time    07/19/13 11:47 AM      Result Value Range Status   MRSA by PCR NEGATIVE  NEGATIVE Final   Comment:            The GeneXpert MRSA Assay (FDA     approved for NASAL specimens     only), is one component of a     comprehensive MRSA colonization     surveillance program. It is not     intended to diagnose MRSA     infection nor to guide or     monitor treatment for     MRSA infections.     Studies/Results: Dg  Chest Port 1 View  07/23/2013   *RADIOLOGY REPORT*  Clinical Data: Confirm line placement.  PORTABLE CHEST - 1 VIEW  Comparison: Chest x-ray 07/19/2013.  Findings: New left upper extremity PICC with tip terminating at the superior cavoatrial junction.  Nasogastric tube extends into the proximal stomach.  Lung volumes appear slightly increased and there is pruning of the pulmonary vasculature in the periphery, compatible with underlying COPD.  New dense opacification in the left base, compatible with atelectasis and/or consolidation in the left lower lobe.  Small left pleural effusion.  Probable subsegmental atelectasis in the right lower lobe.  No evidence of pulmonary edema.  Heart size is upper limits of normal.  Upper mediastinal contours are within normal limits.  Atherosclerosis in the thoracic aorta.  IMPRESSION: i1.  Support apparatus, as above. 2.  Worsening aeration at the left base, concerning for a new areas of atelectasis and/or consolidation, with superimposed small left pleural effusion.  Clinical correlation for signs and symptoms of infection or sequelae of recent aspiration is recommended. 3.  Atherosclerosis.   Original Report Authenticated By: Trudie Reed, M.D.    Medications: Scheduled Meds: . antiseptic oral rinse  15 mL Mouth Rinse BID  . aspirin  300 mg Rectal Daily  . chlorhexidine  15 mL Mouth/Throat BID  . famotidine (PEPCID) IV  20 mg Intravenous Q12H  . hydrocortisone sod succinate (SOLU-CORTEF) inj  25 mg Intravenous Q12H  . metoprolol  2.5 mg Intravenous Q6H  . mometasone-formoterol  2 puff Inhalation BID  . sodium chloride  10-40 mL Intracatheter Q12H  . sodium chloride  3 mL Intravenous Q12H  . triamcinolone cream  1 application Topical Weekly   Continuous Infusions: . amiodarone (NEXTERONE PREMIX) 360 mg/200 mL dextrose 30 mg/hr (07/23/13 1533)  . dextrose 5 % and 0.9% NaCl 75 mL/hr at 07/24/13 0842  . Marland KitchenTPN (CLINIMIX-E) Adult 30 mL/hr at 07/23/13 2020   And   . fat emulsion 250 mL (07/23/13 2020)  . Marland KitchenTPN (CLINIMIX-E) Adult     And  . fat emulsion    . heparin 800 Units/hr (07/23/13 1533)   PRN Meds:.bisacodyl, diphenhydrAMINE, hydrALAZINE, morphine injection, ondansetron (ZOFRAN) IV, sodium chloride    LOS: 7 days   [facility]  [facility] Pager [phone]. If 8PM-8AM, please contact night-coverage at [email or web address], password [facility] 07/24/2013, 12:12 PM  LOS: 7 days

## 2013-07-25 ENCOUNTER — Inpatient Hospital Stay (HOSPITAL_COMMUNITY): Payer: Medicare Other

## 2013-07-25 DIAGNOSIS — Z01811 Encounter for preprocedural respiratory examination: Secondary | ICD-10-CM

## 2013-07-25 DIAGNOSIS — J189 Pneumonia, unspecified organism: Secondary | ICD-10-CM | POA: Diagnosis not present

## 2013-07-25 DIAGNOSIS — I5021 Acute systolic (congestive) heart failure: Secondary | ICD-10-CM | POA: Diagnosis not present

## 2013-07-25 LAB — PRO B NATRIURETIC PEPTIDE: Pro B Natriuretic peptide (BNP): 7166 pg/mL — ABNORMAL HIGH (ref 0–450)

## 2013-07-25 LAB — COMPREHENSIVE METABOLIC PANEL
ALT: 21 U/L (ref 0–35)
CO2: 22 mEq/L (ref 19–32)
Calcium: 7.9 mg/dL — ABNORMAL LOW (ref 8.4–10.5)
Creatinine, Ser: 0.74 mg/dL (ref 0.50–1.10)
GFR calc Af Amer: 86 mL/min — ABNORMAL LOW (ref >90)
GFR calc non Af Amer: 74 mL/min — ABNORMAL LOW (ref >90)
Glucose, Bld: 150 mg/dL — ABNORMAL HIGH (ref 70–99)
Total Bilirubin: 0.3 mg/dL (ref 0.3–1.2)

## 2013-07-25 LAB — URINE MICROSCOPIC-ADD ON

## 2013-07-25 LAB — URINALYSIS, ROUTINE W REFLEX MICROSCOPIC
Glucose, UA: NEGATIVE mg/dL
Ketones, ur: NEGATIVE mg/dL
pH: 5 (ref 5.0–8.0)

## 2013-07-25 LAB — CBC
HCT: 32.9 % — ABNORMAL LOW (ref 36.0–46.0)
Hemoglobin: 10.3 g/dL — ABNORMAL LOW (ref 12.0–15.0)
MCH: 25.2 pg — ABNORMAL LOW (ref 26.0–34.0)
MCHC: 31.3 g/dL (ref 30.0–36.0)

## 2013-07-25 LAB — GLUCOSE, CAPILLARY: Glucose-Capillary: 135 mg/dL — ABNORMAL HIGH (ref 70–99)

## 2013-07-25 LAB — PHOSPHORUS: Phosphorus: 2.6 mg/dL (ref 2.3–4.6)

## 2013-07-25 MED ORDER — FUROSEMIDE 10 MG/ML IJ SOLN
INTRAMUSCULAR | Status: AC
  Administered 2013-07-25: 20 mg via INTRAVENOUS
  Filled 2013-07-25: qty 4

## 2013-07-25 MED ORDER — VANCOMYCIN HCL IN DEXTROSE 750-5 MG/150ML-% IV SOLN
750.0000 mg | INTRAVENOUS | Status: DC
  Administered 2013-07-25: 750 mg via INTRAVENOUS
  Filled 2013-07-25 (×2): qty 150

## 2013-07-25 MED ORDER — ACETAMINOPHEN 650 MG RE SUPP: 650.0000 mg | Freq: Four times a day (QID) | RECTAL | Status: DC | PRN

## 2013-07-25 MED ORDER — FAT EMULSION 20 % IV EMUL
250.0000 mL | INTRAVENOUS | Status: AC
  Administered 2013-07-25: 250 mL via INTRAVENOUS
  Filled 2013-07-25: qty 250

## 2013-07-25 MED ORDER — ALBUTEROL SULFATE (5 MG/ML) 0.5% IN NEBU
2.5000 mg | INHALATION_SOLUTION | RESPIRATORY_TRACT | Status: DC | PRN
  Administered 2013-07-25 (×2): 2.5 mg via RESPIRATORY_TRACT
  Filled 2013-07-25 (×2): qty 0.5

## 2013-07-25 MED ORDER — PIPERACILLIN-TAZOBACTAM 3.375 G IVPB
3.3750 g | Freq: Three times a day (TID) | INTRAVENOUS | Status: DC
  Administered 2013-07-25 – 2013-07-28 (×8): 3.375 g via INTRAVENOUS
  Filled 2013-07-25 (×11): qty 50

## 2013-07-25 MED ORDER — TRACE MINERALS CR-CU-F-FE-I-MN-MO-SE-ZN IV SOLN
INTRAVENOUS | Status: AC
  Administered 2013-07-25: 17:00:00 via INTRAVENOUS
  Filled 2013-07-25: qty 2000

## 2013-07-25 MED ORDER — MORPHINE SULFATE 2 MG/ML IJ SOLN
1.0000 mg | INTRAMUSCULAR | Status: DC | PRN
  Administered 2013-07-26 – 2013-08-02 (×5): 1 mg via INTRAVENOUS
  Filled 2013-07-25 (×5): qty 1

## 2013-07-25 MED ORDER — ENOXAPARIN SODIUM 30 MG/0.3ML ~~LOC~~ SOLN
30.0000 mg | SUBCUTANEOUS | Status: DC
  Administered 2013-07-25: 30 mg via SUBCUTANEOUS
  Filled 2013-07-25 (×2): qty 0.3

## 2013-07-25 MED ORDER — BIOTENE DRY MOUTH MT LIQD
15.0000 mL | Freq: Four times a day (QID) | OROMUCOSAL | Status: DC
  Administered 2013-07-25 – 2013-08-02 (×28): 15 mL via OROMUCOSAL

## 2013-07-25 MED ORDER — LORAZEPAM 2 MG/ML IJ SOLN
0.5000 mg | Freq: Two times a day (BID) | INTRAMUSCULAR | Status: DC | PRN
  Administered 2013-07-27 – 2013-08-01 (×3): 0.5 mg via INTRAVENOUS
  Filled 2013-07-25 (×3): qty 1

## 2013-07-25 MED ORDER — FUROSEMIDE 10 MG/ML IJ SOLN: 20.0000 mg | Freq: Once | INTRAMUSCULAR | Status: AC

## 2013-07-25 NOTE — Progress Notes (Signed)
ANTICOAGULATION CONSULT NOTE - Follow Up Consult  Pharmacy Consult for Heparin Indication: NSTEMI, Afib  No Known Allergies  Patient Measurements: Height: 5\' 4"  (162.6 cm) Weight: 96 lb 1.9 oz (43.6 kg) IBW/kg (Calculated) : 54.7 Heparin Dosing Weight: 40.6kg  Vital Signs: Temp: 98.4 F (36.9 C) (07/27 0400) Temp src: Oral (07/27 0400) BP: 129/60 mmHg (07/26 2322) Pulse Rate: 87 (07/27 0400)  Labs:  Recent Labs  07/23/13 0400  07/24/13 0530 07/24/13 1200 07/25/13 0345  HGB 10.1*  --  9.9*  --  10.3*  HCT 32.4*  --  31.4*  --  32.9*  PLT 308  --  302  --  270  HEPARINUNFRC 0.53  --  0.33  --  0.42  CREATININE 0.68  < > 0.67 0.69 0.74  < > = values in this interval not displayed.  Estimated Creatinine Clearance: 34.1 ml/min (by C-G formula based on Cr of 0.74).   Medications:  Scheduled:  . antiseptic oral rinse  15 mL Mouth Rinse QID  . aspirin  300 mg Rectal Daily  . chlorhexidine  15 mL Mouth/Throat BID  . famotidine (PEPCID) IV  20 mg Intravenous Q12H  . hydrocortisone sod succinate (SOLU-CORTEF) inj  25 mg Intravenous Q12H  . metoprolol  2.5 mg Intravenous Q6H  . mometasone-formoterol  2 puff Inhalation BID  . sodium chloride  10-40 mL Intracatheter Q12H  . sodium chloride  3 mL Intravenous Q12H  . triamcinolone cream  1 application Topical Weekly   Infusions:  . amiodarone (NEXTERONE PREMIX) 360 mg/200 mL dextrose 30 mg/hr (07/25/13 0700)  . dextrose 5 % and 0.9 % NaCl with KCl 20 mEq/L 75 mL/hr at 07/24/13 1742  . Marland KitchenTPN (CLINIMIX-E) Adult 50 mL/hr at 07/24/13 1802   And  . fat emulsion 250 mL (07/24/13 1802)  . Marland KitchenTPN (CLINIMIX-E) Adult     And  . fat emulsion    . heparin 800 Units/hr (07/25/13 0700)    Assessment: 79 year old female admitted 7/19 with small bowel obstruction. Transferred to ICU late 7/20 pm with NSTEMI and new Afib/RVR and started on IV heparin.   Heparin level therapeutic (0.42)  H/H low but stable, platelets stable  No  bleeding events reported in chart notes   Goal of Therapy:  Heparin levels 0.3-0.7 Monitor platelets by anticoagulation protocol: Yes   Plan:   Continue heparin 800 units/hr   Follow up heparin level and CBC in am  Follow up if planning surgery as patient is currently refusing surgery per [facility] notes - if so, MD to specify when to hold heparin  Hessie Knows, PharmD, BCPS Pager [phone] 07/25/2013 8:01 AM

## 2013-07-25 NOTE — Progress Notes (Signed)
Pt wheezing and SOB. Pt reassured and O2 increased. [facility] NP notified and prn breathing treatment ordered, and administered by RT. Will continue to monitor.

## 2013-07-25 NOTE — Progress Notes (Signed)
Patient ID: Ariel Hunt, female   DOB: 1923-01-10, 79 y.o.   MRN: [id number]  General Surgery - [facility] - Progress Note  HD# 8  Subjective: Patient awake and alert.  No complaints.  Denies pain.  No BM or flatus overnight.  Patient thinks abdomen is softer.  Objective: Vital signs in last 24 hours: Temp:  [97.5 F (36.4 C)-102.5 F (39.2 C)] 98.4 F (36.9 C) (07/27 0400) Pulse Rate:  [45-152] 87 (07/27 0400) Resp:  [15-26] 17 (07/27 0400) BP: (124-166)/(55-85) 129/60 mmHg (07/26 2322) SpO2:  [82 %-100 %] 100 % (07/27 0400) Last BM Date: 07/17/13  Intake/Output from previous day: 07/26 0701 - 07/27 0700 In: 3507.8 [P.O.:240; I.V.:1977.8; IV Piggyback:100; TPN:1190] Out: 2520 [Urine:920; Emesis/NG output:1600]  Exam: HEENT - clear, not icteric Neck - soft, no mass Chest - clear bilaterally Cor - RRR, no murmur Abd - softer, mild distension; few BS present; no tenderness; well healed midline incision; no hernia Ext - no significant edema Neuro - grossly intact, no focal deficits  Lab Results:   Recent Labs  07/24/13 0530 07/25/13 0345  WBC 7.4 15.6*  HGB 9.9* 10.3*  HCT 31.4* 32.9*  PLT 302 270     Recent Labs  07/24/13 1200 07/25/13 0345  NA 136 138  K 3.3* 4.1  CL 104 106  CO2 26 22  GLUCOSE 134* 150*  BUN 11 15  CREATININE 0.69 0.74  CALCIUM 8.0* 7.9*    Studies/Results: Dg Chest Port 1 View  07/25/2013   *RADIOLOGY REPORT*  Clinical Data: Fever and congestion  PORTABLE CHEST - 1 VIEW  Comparison:  July 23, 2013  Findings:  Tube and catheter positions are unchanged.  No pneumothorax.  There is new effusion on the right with right base atelectasis.  Left lower lobe consolidation remains.  Heart is mildly enlarged with normal pulmonary vascularity.  No adenopathy. There is atherosclerotic change in the aorta. Bones are osteoporotic.  IMPRESSION: Persistent left lower lobe consolidation.  New effusion on the right with patchy right  base atelectasis.  No pneumothorax.  No change in cardiac silhouette.   Original Report Authenticated By: Bretta Bang, M.D.   Dg Chest Port 1 View  07/23/2013   *RADIOLOGY REPORT*  Clinical Data: Confirm line placement.  PORTABLE CHEST - 1 VIEW  Comparison: Chest x-ray 07/19/2013.  Findings: New left upper extremity PICC with tip terminating at the superior cavoatrial junction.  Nasogastric tube extends into the proximal stomach.  Lung volumes appear slightly increased and there is pruning of the pulmonary vasculature in the periphery, compatible with underlying COPD.  New dense opacification in the left base, compatible with atelectasis and/or consolidation in the left lower lobe.  Small left pleural effusion.  Probable subsegmental atelectasis in the right lower lobe.  No evidence of pulmonary edema.  Heart size is upper limits of normal.  Upper mediastinal contours are within normal limits.  Atherosclerosis in the thoracic aorta.  IMPRESSION: i1.  Support apparatus, as above. 2.  Worsening aeration at the left base, concerning for a new areas of atelectasis and/or consolidation, with superimposed small left pleural effusion.  Clinical correlation for signs and symptoms of infection or sequelae of recent aspiration is recommended. 3.  Atherosclerosis.   Original Report Authenticated By: Trudie Reed, M.D.    Assessment / Plan: 1.  Small bowel obstruction  Patient does not want surgery, but seems to be coming around to idea of surgery if necessary  Will get AXR in  AM 7/28  Allow ice chips, continue NG tube  Will discuss with Dr. Derrell Lolling in AM  Medical service monitoring WBC - possible pneumonia  Velora Heckler, MD, [facility] Office: [phone]  07/25/2013

## 2013-07-25 NOTE — Consult Note (Addendum)
PULMONARY  / CRITICAL CARE MEDICINE  Name: Ariel Hunt MRN: [id number] DOB: 1923/01/19    ADMISSION DATE:  07/17/2013 CONSULTATION DATE:  07/25/13  REFERRING MD :  Dr Marcellus Scott PRIMARY SERVICE: [facility]  CHIEF COMPLAINT:  Pre-operative Pulmonary Evaluation  HPI:  79 year old functional female with history of asthma but no COPD and as stated DO NOT RESUSCITATE but with reported moderate-severe aortic  stenosis, coronary artery disease, metabolic syndrome, anemia of chronic disease, cachexia Body mass index is 16.49 kg/(m^2). and normal renal function. She was admitted on 07/17/2013 with a small bowel obstruction that is being conservatively managed medically. Course complicated by hospital-acquired pneumonia for which he is on antibiotics and also tachyarrhythmias with V. tach, SVT and A. fib on 07/18/2013 requiring transient pressors through July 22nd 2014. 07/25/2013 she has had no clinical improvement in her small bowel obstruction and surgery is being entertained. Pulmonary critical care has been consulted for preoperative pulmonary evaluation.      has a past medical history of Colitis; Weight loss; Diarrhea; CAD (coronary artery disease); Hyperlipidemia; Hypertension; Asthma; GERD (gastroesophageal reflux disease); and Atrial fibrillation.   has past surgical history that includes Hernia repair; Coronary angioplasty with stent; and bowel adhesions.   SIGNIFICANT EVENTS / STUDIES:  07/17/2013: Admission with small bowel obstruction  LINES / TUBES: None  CULTURES: Results for orders placed during the hospital encounter of 07/17/13  MRSA PCR SCREENING     Status: None   Collection Time    07/19/13 11:47 AM      Result Value Range Status   MRSA by PCR NEGATIVE  NEGATIVE Final   Comment:            The GeneXpert MRSA Assay (FDA     approved for NASAL specimens     only), is one component of a     comprehensive MRSA colonization     surveillance program. It is  not     intended to diagnose MRSA     infection nor to guide or     monitor treatment for     MRSA infections.     ANTIBIOTICS: Anti-infectives   Start     Dose/Rate Route Frequency Ordered Stop   07/25/13 1000  vancomycin (VANCOCIN) IVPB 750 mg/150 ml premix     750 mg 150 mL/hr over 60 Minutes Intravenous Every 24 hours 07/25/13 0836     07/25/13 1000  piperacillin-tazobactam (ZOSYN) IVPB 3.375 g     3.375 g 12.5 mL/hr over 240 Minutes Intravenous Every 8 hours 07/25/13 0836        PAST MEDICAL HISTORY :  Past Medical History  Diagnosis Date  . Colitis   . Weight loss   . Diarrhea   . CAD (coronary artery disease)   . Hyperlipidemia   . Hypertension   . Asthma   . GERD (gastroesophageal reflux disease)   . Atrial fibrillation    Past Surgical History  Procedure Laterality Date  . Hernia repair      inguinal  . Coronary angioplasty with stent placement    . Bowel adhesions      several   Prior to Admission medications   Medication Sig Start Date End Date Taking? Authorizing Provider  aspirin EC 81 MG tablet Take 81 mg by mouth daily.   Yes Historical Provider, MD  budesonide (ENTOCORT EC) 3 MG 24 hr capsule Take 6 mg by mouth daily with lunch.   Yes Historical Provider, MD  diphenhydrAMINE (BENADRYL) 25  mg capsule Take 25 mg by mouth every 6 (six) hours as needed for itching.   Yes Historical Provider, MD  Fluticasone-Salmeterol (ADVAIR DISKUS) 100-50 MCG/DOSE AEPB Inhale 1 puff into the lungs every 12 (twelve) hours.     Yes Historical Provider, MD  furosemide (LASIX) 20 MG tablet Take 20 mg by mouth every morning.   Yes Historical Provider, MD  HYDROcodone-acetaminophen (NORCO/VICODIN) 5-325 MG per tablet Take 1 tablet by mouth every 6 (six) hours as needed for pain.   Yes Historical Provider, MD  HYDROcodone-acetaminophen (VICODIN) 5-500 MG per tablet Take 1 tablet by mouth every 6 (six) hours as needed. For pain relief   Yes Historical Provider, MD  Multiple  Vitamin (MULTIVITAMIN) tablet Take 1 tablet by mouth daily.     Yes Historical Provider, MD  potassium chloride (K-DUR,KLOR-CON) 10 MEQ tablet Take 10 mEq by mouth daily.   Yes Historical Provider, MD  traMADol (ULTRAM) 50 MG tablet Take 50 mg by mouth every 6 (six) hours as needed. For pain relief   Yes Historical Provider, MD  triamcinolone cream (KENALOG) 0.1 % Apply 1 application topically once a week. Apply to back for itching   Yes Historical Provider, MD  valsartan (DIOVAN) 160 MG tablet Take 80 mg by mouth 2 (two) times daily.   Yes Historical Provider, MD   No Known Allergies  FAMILY HISTORY:  Family History  Problem Relation Age of Onset  . Colon cancer Father   . Breast cancer Sister   . Breast cancer Maternal Aunt    SOCIAL HISTORY:  reports that she quit smoking about 22 years ago. Her smoking use included Cigarettes. She smoked 0.00 packs per day. She has never used smokeless tobacco. She reports that she does not drink alcohol or use illicit drugs.  REVIEW OF SYSTEMS:  Per history of present illness     VITAL SIGNS: Temp:  [97.5 F (36.4 C)-102.5 F (39.2 C)] 98.9 F (37.2 C) (07/27 0800) Pulse Rate:  [45-152] 73 (07/27 1030) Resp:  [16-31] 18 (07/27 1030) BP: (103-163)/(52-82) 103/52 mmHg (07/27 0800) SpO2:  [82 %-100 %] 99 % (07/27 0938) HEMODYNAMICS:   VENTILATOR SETTINGS:   INTAKE / OUTPUT: Intake/Output     07/26 0701 - 07/27 0700 07/27 0701 - 07/28 0700   P.O. 240    I.V. (mL/kg) 1977.8 (45.4) 227.1 (5.2)   IV Piggyback 100    TPN 1190 180   Total Intake(mL/kg) 3507.8 (80.5) 407.1 (9.3)   Urine (mL/kg/hr) 920 (0.9) 600 (2.6)   Emesis/NG output 1600 (1.5)    Total Output 2520 600   Net +987.8 -192.9          PHYSICAL EXAMINATION: General:  Frail elderly female lying in bed no distress Neuro:  Hard of hearing but alert and oriented x3. CAM-ICU negative for delirium. Moves all fours HEENT:  Hard of hearing. Neck is supple. No neck nodes. NG tube  present Cardiovascular:  Normal sinus rhythm. Blood pressure soft. Lungs:  Some diminished air entry in the bases scattered crackles. No respiratory distress Abdomen:  Soft but distended and non-physically tender Musculoskeletal:  No cyanosis no clubbing no edema Skin:  Chronic bruises  LABS: PULMONARY No results found for this basename: PHART, PCO2, PCO2ART, PO2, PO2ART, HCO3, TCO2, O2SAT,  in the last 168 hours  CBC  Recent Labs Lab 07/23/13 0400 07/24/13 0530 07/25/13 0345  HGB 10.1* 9.9* 10.3*  HCT 32.4* 31.4* 32.9*  WBC 6.5 7.4 15.6*  PLT 308 302 270  COAGULATION No results found for this basename: INR,  in the last 168 hours  CARDIAC   Recent Labs Lab 07/19/13 0325 07/19/13 0940 07/19/13 1556 07/19/13 2209  TROPONINI 0.43* 0.43* 0.34* 0.40*    Recent Labs Lab 07/25/13 0345  PROBNP 7166.0*     CHEMISTRY  Recent Labs Lab 07/20/13 0335  07/22/13 1405 07/23/13 0400 07/23/13 1629 07/24/13 0530 07/24/13 1200 07/25/13 0345  NA 142  < >  --  144 142 139 136 138  K 3.1*  < > 3.2* 2.5* 3.2* 5.6* 3.3* 4.1  CL 106  < >  --  107 105 106 104 106  CO2 28  < >  --  30 29 27 26 22   GLUCOSE 115*  < >  --  106* 89 129* 134* 150*  BUN 15  < >  --  9 9 11 11 15   CREATININE 0.84  < >  --  0.68 0.72 0.67 0.69 0.74  CALCIUM 8.6  < >  --  8.0* 7.9* 6.8* 8.0* 7.9*  MG 2.3  --  2.2 2.1  --  1.4*  --  2.7*  PHOS  --   --   --  2.7  --  2.0*  --  2.6  < > = values in this interval not displayed.  LIVER  Recent Labs Lab 07/21/13 0235 07/22/13 0335 07/23/13 0400 07/24/13 0530 07/25/13 0345  AST 18 19 19  54* 25  ALT 15 13 15 21 21   ALKPHOS 36* 31* 34* 31* 38*  BILITOT 0.1* 0.2* 0.2* 0.2* 0.3  PROT 5.6* 4.9* 5.0* 5.2* 5.4*  ALBUMIN 2.8* 2.4* 2.5* 2.6* 2.7*     INFECTIOUS No results found for this basename: LATICACIDVEN, PROCALCITON,  in the last 168 hours   ENDOCRINE CBG (last 3)   Recent Labs  07/24/13 2018 07/25/13 0351 07/25/13 0757  GLUCAP  136* 141* 107*         IMAGING x48h  Dg Chest Port 1 View  07/25/2013   *RADIOLOGY REPORT*  Clinical Data: Fever and congestion  PORTABLE CHEST - 1 VIEW  Comparison:  July 23, 2013  Findings:  Tube and catheter positions are unchanged.  No pneumothorax.  There is new effusion on the right with right base atelectasis.  Left lower lobe consolidation remains.  Heart is mildly enlarged with normal pulmonary vascularity.  No adenopathy. There is atherosclerotic change in the aorta. Bones are osteoporotic.  IMPRESSION: Persistent left lower lobe consolidation.  New effusion on the right with patchy right base atelectasis.  No pneumothorax.  No change in cardiac silhouette.   Original Report Authenticated By: Bretta Bang, M.D.   Dg Chest Port 1 View  07/23/2013   *RADIOLOGY REPORT*  Clinical Data: Confirm line placement.  PORTABLE CHEST - 1 VIEW  Comparison: Chest x-ray 07/19/2013.  Findings: New left upper extremity PICC with tip terminating at the superior cavoatrial junction.  Nasogastric tube extends into the proximal stomach.  Lung volumes appear slightly increased and there is pruning of the pulmonary vasculature in the periphery, compatible with underlying COPD.  New dense opacification in the left base, compatible with atelectasis and/or consolidation in the left lower lobe.  Small left pleural effusion.  Probable subsegmental atelectasis in the right lower lobe.  No evidence of pulmonary edema.  Heart size is upper limits of normal.  Upper mediastinal contours are within normal limits.  Atherosclerosis in the thoracic aorta.  IMPRESSION: i1.  Support apparatus, as above. 2.  Worsening aeration  at the left base, concerning for a new areas of atelectasis and/or consolidation, with superimposed small left pleural effusion.  Clinical correlation for signs and symptoms of infection or sequelae of recent aspiration is recommended. 3.  Atherosclerosis.   Original Report Authenticated By: Trudie Reed, M.D.    PREOPERATIVE ASSESSMENT [facility] Postperative Pulmonary Risk Score comments Score - scenario 1 with upper abd incision Score - scenario 2 without upper abd incidion  Type of surgery - abd ao aneurysm (27), thoracic (21), neurosurgery / upper abdominal / vascular (21), neck (11) laparotmy for SBO 21 0  Emergency Surgery - (11) Yes emergency 11 11  ALbumin < 3 or poor nutritional state - (9) Alb is 2.7 9 9   BUN > 30 -  (8) BUN is 15 0 0  Partial or completely dependent functional status - (7) Was functional at home 0 0  COPD -  (6) No report of cod 0 0  Age - 32 to 44 (4), > 70  (6) Age is 65 6 6  TOTAL  47 26  Risk Stratifcation scores  - < 10, 11-19, 20-27, 28-40, >40  HIGH RISK MODERATE RISK     CANET Postperative Pulmonary Risk Score comments Score Scenario 1 Long surgery with upper abd incision Score scenario 2 - short surgery without upper abd incision  Age - <50 (0), 50-80 (3), >80 (16) Age is 78 16 16  Preoperative pulse ox - >96 (0), 91-95 (8), <90 (24) Pulse ox 95% on RA 0 0  Respiratory infection in last month - Yes (17) Ongoing pneumonia 17 17  Preoperative anemia - < 10gm% - Yes (11) Yes hb 9.9gm% 11 11  Surgical incision - Upper abdominal (15), Thoracic (24) possibl upper abd incision 15 0  Duration of surgery - <2h (0), 2-3h (16), >3h (23) > 2h 16 0  Emergency Surgery - Yes (8) Yes emregency  8 8  TOTAL  83 52  Risk Stratification - Low (<26), Intermediate (26-44), High (>45)  HIGH HIGH       ASSESSMENT / PLAN:  PULMONARY A: Preoperative pulmonary risk evaluation. The best case scenario is that she is at moderate-high risk for significant pulmonary complications following laparotomy. Her risk in various scoring systems ranges as moderate or high with risk  being determined by  her age, overall nutritional status which is poor, ongoing respiratory infection, anemia and the fact this will be an emergency surgery. Risk will be further increased to  definite HIGH category in all scoring systems if there is an upper abdominal incision or duration of surgery is greater than 2 hours.  Major Pulmonary risks follwing surgery identified in the multifactorial risk analysis are but not limited to a) pneumonia; b) recurrent intubation risk; c) prolonged or recurrent acute respiratory failure needing mechanical ventilation; d) prolonged hospitalization; e) DVT/Pulmonary embolism; f) Acute Pulmonary edema    P:   Recommend-detailed explanation of risks with patient and family and if they still want to proceed with surgery  1. Short duration of surgery as much as possible and avoid paralytic if possible. Ideal is for surgical duration to be less than 2 hours 2. ideally avoid upper abdominal incision if possible 3. Recovery in step down or ICU with Pulmonary consultation; likely return to the ICU intubated status 4. DVT prophylaxis 4. Aggressive pulmonary toilet with o2, bronchodilatation, and incentive spirometry and early ambulation    CARDIOVASCULAR A: Moderate Severe aortic stenosis and cardiac arrhythmias P:  Per cardiology  RENAL  A:  Normal renal function P:   Per [facility]  GASTROINTESTINAL A:  Small bowel obstruction P:   Being managed medically by [facility] with surgical consultation  HEMATOLOGIC A:  Anemia of chronic disease P:  Packed red blood cells for hemoglobin less than 7 g percent only  INFECTIOUS A:  Left lower lobe hospital-acquired pneumonia P:   Antibiotics per [facility]  ENDOCRINE A:  Nil acute   P:   Monitor  NEUROLOGIC A:  Neurologically intact P:   Try to avoid delirium ifpossible but she is at high risk      Dr. Kalman Shan, M.D., [facility].C.P Pulmonary and Critical Care Medicine Staff Physician [facility] Pager: [phone], If no answer or between  15:00h - 7:00h: call [phone]  07/25/2013 12:58 PM

## 2013-07-25 NOTE — Progress Notes (Signed)
Ariel Hunt  79 y.o.  female  Subjective: Continues uncomfortable.  Reported dyspnea earlier, but now denies.  No cough nor sputum.  Febrile last nite without rigors.  Allergy: Review of patient's allergies indicates no known allergies.  Objective: Vital signs in last 24 hours: Temp:  [97.5 F (36.4 C)-102.5 F (39.2 C)] 98.4 F (36.9 C) (07/27 0400) Pulse Rate:  [45-152] 87 (07/27 0400) Resp:  [15-26] 17 (07/27 0400) BP: (124-166)/(55-85) 129/60 mmHg (07/26 2322) SpO2:  [82 %-100 %] 100 % (07/27 0400)  43.6 kg (96 lb 1.9 oz) Body mass index is 16.49 kg/(m^2).  Weight change:  Last BM Date: 07/17/13  Intake/Output from previous day: 07/26 0701 - 07/27 0700 In: 3507.8 [P.O.:240; I.V.:1977.8; IV Piggyback:100; TPN:1190] Out: 2520 [Urine:920; Emesis/NG output:1600] Total I/O since admission:  +1500 cc, but weight increased 12 lbs.  General- Well developed; no acute distress ; thin; sunken eyes; remains alert and mentally sharp Neck- No JVD, bilateral carotid bruits versus transmitted murmur Lungs- decreased breath sounds at the left base without egophony, but with bronchial breath sounds and wheezing;  Cardiovascular- normal PMI; normal S1 and decreased S2; wheezing grade 2/6 mid peaking systolic ejection murmur at the base Skin- Warm, no multiple ecchymoses Extremities-  no edema  Lab Results: CBC:    Recent Labs  07/24/13 0530 07/25/13 0345  WBC 7.4 15.6*  HGB 9.9* 10.3*  HCT 31.4* 32.9*  PLT 302 270   BMET:   Recent Labs  07/24/13 1200 07/25/13 0345  NA 136 138  K 3.3* 4.1  CL 104 106  CO2 26 22  GLUCOSE 134* 150*  BUN 11 15  CREATININE 0.69 0.74  CALCIUM 8.0* 7.9*   Hepatic Function:    Recent Labs  07/25/13 0345  PROT 5.4*  ALBUMIN 2.7*  AST 25  ALT 21  ALKPHOS 38*  BILITOT 0.3   GFR:  Estimated Creatinine Clearance: 34.1 ml/min (by C-G formula based on Cr of 0.74). Lipids:  Lipid Panel     Component Value Date/Time   CHOL 130 05/09/2012  0625   TRIG 68 07/23/2013 0400   HDL 39* 05/09/2012 0625   CHOLHDL 3.3 05/09/2012 0625   VLDL 16 05/09/2012 0625   LDLCALC 75 05/09/2012 0625   Telemetry: Normal sinus rhythm with PVCs, frequent at times.  Imaging Studies/Results: Dg Chest Port 1 View  07/23/2013  Worsening aeration at the left base, concerning for a new areas of atelectasis and/or consolidation, with superimposed small left pleural effusion.  Atherosclerosis-thoracic aorta.   Imaging: Imaging results have been reviewed  Medications:  I have reviewed the patient's current medications. Scheduled: . antiseptic oral rinse  15 mL Mouth Rinse QID  . aspirin  300 mg Rectal Daily  . chlorhexidine  15 mL Mouth/Throat BID  . famotidine (PEPCID) IV  20 mg Intravenous Q12H  . hydrocortisone sod succinate (SOLU-CORTEF) inj  25 mg Intravenous Q12H  . metoprolol  2.5 mg Intravenous Q6H  . mometasone-formoterol  2 puff Inhalation BID  . sodium chloride  10-40 mL Intracatheter Q12H  . sodium chloride  3 mL Intravenous Q12H  . triamcinolone cream  1 application Topical Weekly   Infusions:   . amiodarone (NEXTERONE PREMIX) 360 mg/200 mL dextrose 30 mg/hr (07/25/13 0700)  . dextrose 5 % and 0.9 % NaCl with KCl 20 mEq/L 75 mL/hr at 07/24/13 1742  . Marland KitchenTPN (CLINIMIX-E) Adult 50 mL/hr at 07/24/13 1802   And  . fat emulsion 250 mL (07/24/13 1802)  .  heparin 800 Units/hr (07/25/13 0700)    Assessment/Plan: Ventricular tachycardia/atrial fibrillation: Stable on amiodarone  Coronary artery disease: stable on ASA, which can be continued perioperatively  Anticoagulation: with brief episode of AF and no recurrence, heparin can be d/ced  Hypokalemia: Now nl  Left lower lobe infiltrate: Pneumonia is a consideration.  With fever and leukocytosis would start antibiotics to cover lung and perhaps possible abdominal source.  Dyspnea reportedly improved after furosemide administered.  Wt is up, but I/O fairly well balanced.  Will recheck CXR and  BNP level.  Maintain I and O approximately equal for now.  Aortic stenosis: Moderate by echocardiogram this admission.  Not symptomatic at present  Hypocalcemia: Improved; ionized only marginally low  Hypomagnesemia: Level now normal.  Small bowel obstruction: high risk for surgery.  No other preoperative cardiac intervention warranted.  If significant pneumonia develops it might be best to wait for resolution.  CCM may be helpful addition to care team.   LOS: 8 days   Ariel Hunt 07/25/2013, 7:55 AM

## 2013-07-25 NOTE — Progress Notes (Signed)
PARENTERAL NUTRITION CONSULT NOTE - Follow Up  Pharmacy Consult for TNA Indication: SBO, prolonged NPO  No Known Allergies  Patient Measurements: Height: 5\' 4"  (162.6 cm) Weight: 96 lb 1.9 oz (43.6 kg) IBW/kg (Calculated) : 54.7 Usual wieght: 44kg  Vital Signs: Temp: 98.4 F (36.9 C) (07/27 0400) Temp src: Oral (07/27 0400) BP: 129/60 mmHg (07/26 2322) Pulse Rate: 87 (07/27 0400) Intake/Output from previous day: 07/26 0701 - 07/27 0700 In: 3507.8 [P.O.:240; I.V.:1977.8; IV Piggyback:100; TPN:1190] Out: 2520 [Urine:920; Emesis/NG output:1600] Intake/Output from this shift:    Labs:  Recent Labs  07/23/13 0400 07/24/13 0530 07/25/13 0345  WBC 6.5 7.4 15.6*  HGB 10.1* 9.9* 10.3*  HCT 32.4* 31.4* 32.9*  PLT 308 302 270     Recent Labs  07/23/13 0400  07/24/13 0530 07/24/13 1200 07/25/13 0345  NA 144  < > 139 136 138  K 2.5*  < > 5.6* 3.3* 4.1  CL 107  < > 106 104 106  CO2 30  < > 27 26 22   GLUCOSE 106*  < > 129* 134* 150*  BUN 9  < > 11 11 15   CREATININE 0.68  < > 0.67 0.69 0.74  CALCIUM 8.0*  < > 6.8* 8.0* 7.9*  MG 2.1  --  1.4*  --  2.7*  PHOS 2.7  --  2.0*  --  2.6  PROT 5.0*  --  5.2*  --  5.4*  ALBUMIN 2.5*  --  2.6*  --  2.7*  AST 19  --  54*  --  25  ALT 15  --  21  --  21  ALKPHOS 34*  --  31*  --  38*  BILITOT 0.2*  --  0.2*  --  0.3  PREALBUMIN 13.8*  --   --   --   --   TRIG 68  --   --   --   --   < > = values in this interval not displayed. Estimated Creatinine Clearance: 34.1 ml/min (by C-G formula based on Cr of 0.74).    Recent Labs  07/24/13 1612 07/24/13 2018 07/25/13 0351  GLUCAP 137* 136* 141*    Medical History: Past Medical History  Diagnosis Date  . Colitis   . Weight loss   . Diarrhea   . CAD (coronary artery disease)   . Hyperlipidemia   . Hypertension   . Asthma   . GERD (gastroesophageal reflux disease)   . Atrial fibrillation     Medications:  Scheduled:  . antiseptic oral rinse  15 mL Mouth Rinse QID   . aspirin  300 mg Rectal Daily  . chlorhexidine  15 mL Mouth/Throat BID  . famotidine (PEPCID) IV  20 mg Intravenous Q12H  . hydrocortisone sod succinate (SOLU-CORTEF) inj  25 mg Intravenous Q12H  . metoprolol  2.5 mg Intravenous Q6H  . mometasone-formoterol  2 puff Inhalation BID  . sodium chloride  10-40 mL Intracatheter Q12H  . sodium chloride  3 mL Intravenous Q12H  . triamcinolone cream  1 application Topical Weekly   Infusions:  . amiodarone (NEXTERONE PREMIX) 360 mg/200 mL dextrose 30 mg/hr (07/25/13 0700)  . dextrose 5 % and 0.9 % NaCl with KCl 20 mEq/L 75 mL/hr at 07/24/13 1742  . Marland KitchenTPN (CLINIMIX-E) Adult 50 mL/hr at 07/24/13 1802   And  . fat emulsion 250 mL (07/24/13 1802)  . heparin 800 Units/hr (07/25/13 0700)    Current Nutrition:  NPO except ice, popsicles  TNA:  Clinimix E 5/15 at 50 ml/hr  IVF: D5NS 20KCl at 75 ml/min  Assessment: 79 year old female admitted 7/19 with small bowel obstruction. Also developed cardiac arrhythmias and NSTEMI. She has been NPO since admit. Plan is to avoid surgery if possible due to high risk. Place PICC and begin TNA today 7/25 per Pharmacy.  -Renal/hepatic function wnl and stable -Lytes: K+ now WNL. Phos WNL now but Mag slightly above normal due to 4g ordered by Md yesterday -Glucose: 129-141 -Triglycerides: wnl, 68 (7/25) -Prealbumin: 13.8 (7/25) -LFTs: WNL -NG output last 24hr: 1600 mL  Nutritional Goals:  RD assessment 7/22: Goal rate of 50 ml/hr will provide: Kcal: 1300-1500  Protein: 50-60 grams  Fluid: > 1.2 L  TNA Day: #3 TNA Access: PICC placement 7/25  Plan:  At 1800 today:  Continue Clinimix E5/15 at goal 69ml/hr.  20% fat emulsion at 35ml/hr.  TNA to contain standard multivitamins and trace elements daily.  Mag elevated slightly above normal this AM but expect it back to WNL tomorrow  Continue CBGs q8h - add SSI if needed.   TNA lab panels on Mondays & Thursdays.  F/u daily.   Hessie Knows,  PharmD, BCPS Pager [phone] 07/25/2013 7:48 AM

## 2013-07-25 NOTE — Progress Notes (Signed)
ANTIBIOTIC CONSULT NOTE - INITIAL  Pharmacy Consult for vancomycin/Zosyn Indication: HAP/aspiration PNA  No Known Allergies  Patient Measurements: Height: 5\' 4"  (162.6 cm) Weight: 96 lb 1.9 oz (43.6 kg) IBW/kg (Calculated) : 54.7  Vital Signs: Temp: 98.4 F (36.9 C) (07/27 0400) Temp src: Oral (07/27 0400) BP: 129/60 mmHg (07/26 2322) Pulse Rate: 87 (07/27 0400) Intake/Output from previous day: 07/26 0701 - 07/27 0700 In: 3507.8 [P.O.:240; I.V.:1977.8; IV Piggyback:100; TPN:1190] Out: 2520 [Urine:920; Emesis/NG output:1600] Intake/Output from this shift:    Labs:  Recent Labs  07/23/13 0400  07/24/13 0530 07/24/13 1200 07/25/13 0345  WBC 6.5  --  7.4  --  15.6*  HGB 10.1*  --  9.9*  --  10.3*  PLT 308  --  302  --  270  CREATININE 0.68  < > 0.67 0.69 0.74  < > = values in this interval not displayed. Estimated Creatinine Clearance: 34.1 ml/min (by C-G formula based on Cr of 0.74). No results found for this basename: VANCOTROUGH, Leodis Binet, VANCORANDOM, GENTTROUGH, GENTPEAK, GENTRANDOM, TOBRATROUGH, TOBRAPEAK, TOBRARND, AMIKACINPEAK, AMIKACINTROU, AMIKACIN,  in the last 72 hours   Microbiology: Recent Results (from the past 720 hour(s))  MRSA PCR SCREENING     Status: None   Collection Time    07/19/13 11:47 AM      Result Value Range Status   MRSA by PCR NEGATIVE  NEGATIVE Final   Comment:            The GeneXpert MRSA Assay (FDA     approved for NASAL specimens     only), is one component of a     comprehensive MRSA colonization     surveillance program. It is not     intended to diagnose MRSA     infection nor to guide or     monitor treatment for     MRSA infections.    Medical History: Past Medical History  Diagnosis Date  . Colitis   . Weight loss   . Diarrhea   . CAD (coronary artery disease)   . Hyperlipidemia   . Hypertension   . Asthma   . GERD (gastroesophageal reflux disease)   . Atrial fibrillation     Medications:  Scheduled:  .  antiseptic oral rinse  15 mL Mouth Rinse QID  . aspirin  300 mg Rectal Daily  . chlorhexidine  15 mL Mouth/Throat BID  . enoxaparin (LOVENOX) injection  30 mg Subcutaneous Q24H  . famotidine (PEPCID) IV  20 mg Intravenous Q12H  . hydrocortisone sod succinate (SOLU-CORTEF) inj  25 mg Intravenous Q12H  . metoprolol  2.5 mg Intravenous Q6H  . mometasone-formoterol  2 puff Inhalation BID  . sodium chloride  10-40 mL Intracatheter Q12H  . sodium chloride  3 mL Intravenous Q12H  . triamcinolone cream  1 application Topical Weekly   Infusions:  . amiodarone (NEXTERONE PREMIX) 360 mg/200 mL dextrose 30 mg/hr (07/25/13 0700)  . dextrose 5 % and 0.9 % NaCl with KCl 20 mEq/L 75 mL/hr at 07/24/13 1742  . Marland KitchenTPN (CLINIMIX-E) Adult 50 mL/hr at 07/24/13 1802   And  . fat emulsion 250 mL (07/24/13 1802)  . Marland KitchenTPN (CLINIMIX-E) Adult     And  . fat emulsion     Assessment: 79 yo female known to pharmacy for TNA and heparin protocols now to start vancomycin and Zosyn for suspected HAP vs aspiration PNA  Goal of Therapy:  Vancomycin trough level 15-20 mcg/ml  Plan:  1) Vancomycin 750mg   IV q24 2) Zosyn 3.375g IV q8 (extended interval infusion)    Hessie Knows, PharmD, BCPS Pager [phone] 07/25/2013 8:35 AM

## 2013-07-25 NOTE — Progress Notes (Addendum)
[facility] PROGRESS NOTE  Ariel Hunt ZOX:[id number] DOB: 1923/08/07 DOA: 07/17/2013 PCP: Daisy Floro, MD  Assessment/Plan: Principal Problem:   Small bowel obstruction Active Problems:   COLLAGENOUS COLITIS   Hypertension   CAD (coronary artery disease)   Hyperlipidemia   Anemia   Heart murmur, systolic   DNR (do not resuscitate)   Aortic stenosis, severe   Arrhythmia   NSTEMI (non-ST elevated myocardial infarction)   Ventricular tachycardia   Hypokalemia   Hyperkalemia  Brief narrative: 79 y.o. female with hx of mild to moderate aortic stenosis, last ECHO about a year ago, hx of CAD with cardiac stent, prior SBO felt to be secondary to adhesions, resolved with conservative treatments several times, hx of HTN, dyslipidemia, GERD, asthma, anemia of unclear etiology, presenting to the ER with increased abdominal pain and distention for 2 days. Her last BM was on 07/16/13 and it was normal. She denied hemorrhoidal bleed, bloody diarrhea, epistasis, or any other obvious source of bleeding. Her last Hb was about a year ago (May 2013) and it was 11 grams/DL, then in Oct 2013, it dropped to about 10 grams, and on 07/17/13, it was 7.6 grams per DL, with MCV 75 and normal Cr. CT Abdomen/Pelvis showed SBO with transitional point in the small bowel, query luminal narrowing. She has normal WBC and the rest of her chemistries were unremarkable. Her last colonoscopy was in 2009, when she was dx with collagenous colitis. She is on very low dose of Entecort. She also has compressive Fx and has been taking taking pain medication regularly. [name] consulted surgery and hospitalist was asked to admit her for SBO and anemia.  Assessment & plan:  1. SBO:  Patient presented with 2 days of progressive abdominal distention/pain, culminating in obstipation on 07/17/13. CT abdomen/Pelvis confirmed recurrent SBO. Managing with NG-T suction, NPO, iv fluids and analgesics. Dr Ovidio Kin provided surgical  consultation, and has recommended watchful waiting and conservative management for now. Surgery would be considered very high risk, so hoping to avoid. Managing conservatively-NG decompression, IVF and TNA. Continued SBO despite conservative measures. Patient seems to be more agreeable to surgery today. Surgery following and will make final decision on 7/28. Critical care medicine consulted for perioperative management. 2. Fever/possible HCAP versus aspiration pneumonia: Associated leukocytosis Blood culture sent. UA not suggestive of UTI. Started empiric IV vancomycin and Zosyn. 3. Volume overload/? Acute systolic CHF: Patient's dyspnea improved after her dose of IV Lasix overnight. Reduce IV fluids and will need when necessary IV Lasix. 4. Hypokalemia/hyperkalemia/hypomagnesemia/hypocalcemia: Patient was severely hypokalemic on 7/25-aggressively repleted IV-hypokalemic (K.: 5.6). Change IV fluids to without potassium, reduce potassium and TNA and followup BMP this afternoon. Replete magnesium. Corrected Calcium 7.9 - replete in TNA. All improved/corrected. 5. Anemia: Patient has a history of chronic anemia. Her last HB in 04/2012 was 11.0, dropped to about 10.0 in 09/2012, and on 07/17/13, it was 7.6, MCV 75. Etiology is unclear. Anemia panel ordered. Transfused 2 units of PRBCs, with satisfactory bump in HB to 9.6. HB has remained stable/satisfactory.  6. Moderate - Severe Aortic stenosis: Patient has known history of moderate aortic stenosis, per 2D Echocardiogram about a year ago. Repeat 2D Echocardiogram of 07/18/13, showed normal LV cavity size, normal systolic function, and no regional wall motion abnormalities. There was severe aortic valve stenosis (however per Cardiology- patient has Moderate-severe AS). Valve area: 0.8cm^2(VTI). Valve area: 0.79cm^2 (Vmax), moderate mitral regurgitation, moderately dilated LA and RA, as well as PA peak pressure: 45mm Hg (S).  7. Arrhythmia(NSVT/SVT/Afib)/Hypotension:  Overnight on 07/18/13, patient developed tachyarrhythmias, with V. Tach, SVT and A,fib with RVR. Dr Armanda Magic provided cardiology consultation, patient was managed with Amiodarone bolus, iv Lopressor and Amiodarone infusion. Troponin I bumped to 0.43. In AM of 07/19/13, patient was initially in fast A. Fib, but then reverted to SR, with HR in the 80s. Systolic BP 70-83. Bolused 250 ml NS, and after discussion with family, commenced trial of NeoSynephrine, with family accepting of the fact that if this failed, no further escalation would be attempted. Fortunately, patient responded rather dramatically, with resolution of hypotension. NeoSynephrine was discontinued on 07/20/13, without deleterious effect. Still in SR and HR is satisfactory. Patient had an episode of nonsustained polymorphic VT on 7/24 at 2:40 AM. Otherwise maintaining sinus rhythm. On amiodarone infusion. Cardiology following. Heparin infusion discontinued 7/27. 8. History of colitis:  Patient has known history of Collagenous colitis, on Entocort. This appears clinically stable at this time. On stress doses of steroids. 9. CAD/type 2 NSTEMI: Known history of CAD, S/P PCI/Stent. As described above, now had bump in CEs. CEs have leveled off. Patient likely had demand ischemia. Heparin infusion DC'd 7/27.  10. HTN: See discussion in #4 above. Now normalized.  11. GERD: Asymptomatic. 12. LE wounds: Patient has chronic healing LLE wound, possibly associated with venous insufficiency. Regularly follows up at the wound care clinic. WOC consulted and recommendations implemented.  13. Protein-Calorie malnutrition: Clinically, patient appears severely malnourished. Will need nutritionist input in due course. TNA to be started on 7/25.  GI prophylaxis: IV Pepcid Activity: Up with assistance DVT prophylaxis: Lovenox Nutrition: N.p.o. on TNA since 7/25 Code Status: DNR. Family Communication: Discussed with daughter Ms. Romeo Apple 7/27. Disposition  Plan: Patient will remain in step down unit.   Consultants:  Dr Ovidio Kin, surgeon.   Dr Armanda Magic, cardiologist.  Procedures:  PICC line 7/25  Foley catheter  NG tube to low wall suction  Antibiotics:  N/A.   HPI/Subjective: No BM or flatus. Says that abdomen feels a little soft. Denies dyspnea. Per nursing, febrile overnight, chest congested-improved after a dose of IV Lasix and diuresis, anxiety.  Objective: Vital signs in last 24 hours: Temp:  [97.5 F (36.4 C)-102.5 F (39.2 C)] 98.9 F (37.2 C) (07/27 0800) Pulse Rate:  [45-152] 73 (07/27 1030) Resp:  [16-31] 18 (07/27 1030) BP: (103-163)/(52-82) 103/52 mmHg (07/27 0800) SpO2:  [82 %-100 %] 99 % (07/27 0938) Weight change:  Last BM Date: 07/17/13  Intake/Output from previous day: 07/26 0701 - 07/27 0700 In: 3507.8 [P.O.:240; I.V.:1977.8; IV Piggyback:100; TPN:1190] Out: 2520 [Urine:920; Emesis/NG output:1600] Total I/O In: 345.4 [I.V.:165.4; TPN:180] Out: 600 [Urine:600]   Physical Exam: General: Pleasant and comfortable. Hard of hearing. Frail elderly female.  CHEST:   Reduced breath sounds in the bases with occasional basal crackles. No increased work of breathing. HEART:  Sounds 1 and 2 heard, normal, regular. No JVD or pedal edema. 2/6 systolic ejection murmur at apex. Telemetry: Sinus rhythm in the 60s/70s. No further arrhythmias in the last 48 hours. ABDOMEN:  Abdomen feels less distended than 7/26, soft, non-tender, no palpable organomegaly, no palpable masses. Bowel sounds present. LOWER EXTREMITIES:  No pitting edema, palpable peripheral pulses. Has dressings on LLE.  CENTRAL NERVOUS SYSTEM:  No focal neurologic deficit on gross examination. Alert and oriented.  Lab Results:  Recent Labs  07/24/13 0530 07/25/13 0345  WBC 7.4 15.6*  HGB 9.9* 10.3*  HCT 31.4* 32.9*  PLT 302 270  Recent Labs  07/24/13 1200 07/25/13 0345  NA 136 138  K 3.3* 4.1  CL 104 106  CO2 26 22   GLUCOSE 134* 150*  BUN 11 15  CREATININE 0.69 0.74  CALCIUM 8.0* 7.9*   Recent Results (from the past 240 hour(s))  MRSA PCR SCREENING     Status: None   Collection Time    07/19/13 11:47 AM      Result Value Range Status   MRSA by PCR NEGATIVE  NEGATIVE Final   Comment:            The GeneXpert MRSA Assay (FDA     approved for NASAL specimens     only), is one component of a     comprehensive MRSA colonization     surveillance program. It is not     intended to diagnose MRSA     infection nor to guide or     monitor treatment for     MRSA infections.     Studies/Results: Dg Chest Port 1 View  07/25/2013   *RADIOLOGY REPORT*  Clinical Data: Fever and congestion  PORTABLE CHEST - 1 VIEW  Comparison:  July 23, 2013  Findings:  Tube and catheter positions are unchanged.  No pneumothorax.  There is new effusion on the right with right base atelectasis.  Left lower lobe consolidation remains.  Heart is mildly enlarged with normal pulmonary vascularity.  No adenopathy. There is atherosclerotic change in the aorta. Bones are osteoporotic.  IMPRESSION: Persistent left lower lobe consolidation.  New effusion on the right with patchy right base atelectasis.  No pneumothorax.  No change in cardiac silhouette.   Original Report Authenticated By: Bretta Bang, M.D.   Dg Chest Port 1 View  07/23/2013   *RADIOLOGY REPORT*  Clinical Data: Confirm line placement.  PORTABLE CHEST - 1 VIEW  Comparison: Chest x-ray 07/19/2013.  Findings: New left upper extremity PICC with tip terminating at the superior cavoatrial junction.  Nasogastric tube extends into the proximal stomach.  Lung volumes appear slightly increased and there is pruning of the pulmonary vasculature in the periphery, compatible with underlying COPD.  New dense opacification in the left base, compatible with atelectasis and/or consolidation in the left lower lobe.  Small left pleural effusion.  Probable subsegmental atelectasis in the  right lower lobe.  No evidence of pulmonary edema.  Heart size is upper limits of normal.  Upper mediastinal contours are within normal limits.  Atherosclerosis in the thoracic aorta.  IMPRESSION: i1.  Support apparatus, as above. 2.  Worsening aeration at the left base, concerning for a new areas of atelectasis and/or consolidation, with superimposed small left pleural effusion.  Clinical correlation for signs and symptoms of infection or sequelae of recent aspiration is recommended. 3.  Atherosclerosis.   Original Report Authenticated By: Trudie Reed, M.D.    Medications: Scheduled Meds: . antiseptic oral rinse  15 mL Mouth Rinse QID  . aspirin  300 mg Rectal Daily  . chlorhexidine  15 mL Mouth/Throat BID  . enoxaparin (LOVENOX) injection  30 mg Subcutaneous Q24H  . famotidine (PEPCID) IV  20 mg Intravenous Q12H  . hydrocortisone sod succinate (SOLU-CORTEF) inj  25 mg Intravenous Q12H  . metoprolol  2.5 mg Intravenous Q6H  . mometasone-formoterol  2 puff Inhalation BID  . piperacillin-tazobactam (ZOSYN)  IV  3.375 g Intravenous Q8H  . sodium chloride  10-40 mL Intracatheter Q12H  . sodium chloride  3 mL Intravenous Q12H  . triamcinolone cream  1 application Topical Weekly  . vancomycin  750 mg Intravenous Q24H   Continuous Infusions: . amiodarone (NEXTERONE PREMIX) 360 mg/200 mL dextrose 30 mg/hr (07/25/13 1000)  . dextrose 5 % and 0.9 % NaCl with KCl 20 mEq/L 10 mL/hr at 07/25/13 0857  . Marland KitchenTPN (CLINIMIX-E) Adult 50 mL/hr at 07/24/13 1802   And  . fat emulsion 250 mL (07/24/13 1802)  . Marland KitchenTPN (CLINIMIX-E) Adult     And  . fat emulsion     PRN Meds:.acetaminophen, albuterol, bisacodyl, diphenhydrAMINE, hydrALAZINE, LORazepam, morphine injection, ondansetron (ZOFRAN) IV, sodium chloride    LOS: 8 days   [facility]  [facility] Pager [phone]. If 8PM-8AM, please contact night-coverage at [email or web address], password [facility] 07/25/2013, 12:00 PM  LOS: 8 days

## 2013-07-26 ENCOUNTER — Inpatient Hospital Stay (HOSPITAL_COMMUNITY): Payer: Medicare Other

## 2013-07-26 ENCOUNTER — Encounter (HOSPITAL_COMMUNITY): Payer: Self-pay | Admitting: Certified Registered Nurse Anesthetist

## 2013-07-26 ENCOUNTER — Encounter (HOSPITAL_COMMUNITY)
Admission: EM | Disposition: A | Discharge status: Skilled Nursing Facility | Payer: Self-pay | Source: Home / Self Care | Attending: Internal Medicine

## 2013-07-26 ENCOUNTER — Inpatient Hospital Stay (HOSPITAL_COMMUNITY): Payer: Medicare Other | Admitting: Certified Registered Nurse Anesthetist

## 2013-07-26 DIAGNOSIS — J189 Pneumonia, unspecified organism: Secondary | ICD-10-CM

## 2013-07-26 DIAGNOSIS — R7881 Bacteremia: Secondary | ICD-10-CM | POA: Diagnosis not present

## 2013-07-26 HISTORY — PX: LAPAROTOMY: SHX154

## 2013-07-26 LAB — POCT I-STAT 4, (NA,K, GLUC, HGB,HCT)
Glucose, Bld: 137 mg/dL — ABNORMAL HIGH (ref 70–99)
HCT: 30 % — ABNORMAL LOW (ref 36.0–46.0)

## 2013-07-26 LAB — MAGNESIUM: Magnesium: 2.5 mg/dL (ref 1.5–2.5)

## 2013-07-26 LAB — DIFFERENTIAL
Basophils Absolute: 0 10*3/uL (ref 0.0–0.1)
Basophils Relative: 0 % (ref 0–1)
Eosinophils Absolute: 0 10*3/uL (ref 0.0–0.7)
Monocytes Absolute: 1 10*3/uL (ref 0.1–1.0)
Neutro Abs: 16.1 10*3/uL — ABNORMAL HIGH (ref 1.7–7.7)
Neutrophils Relative %: 91 % — ABNORMAL HIGH (ref 43–77)

## 2013-07-26 LAB — GLUCOSE, CAPILLARY

## 2013-07-26 LAB — COMPREHENSIVE METABOLIC PANEL
BUN: 22 mg/dL (ref 6–23)
Calcium: 7.3 mg/dL — ABNORMAL LOW (ref 8.4–10.5)
GFR calc Af Amer: 85 mL/min — ABNORMAL LOW (ref >90)
GFR calc non Af Amer: 73 mL/min — ABNORMAL LOW (ref >90)
Glucose, Bld: 150 mg/dL — ABNORMAL HIGH (ref 70–99)
Total Protein: 4.3 g/dL — ABNORMAL LOW (ref 6.0–8.3)

## 2013-07-26 LAB — CBC
HCT: 26.1 % — ABNORMAL LOW (ref 36.0–46.0)
Hemoglobin: 8.6 g/dL — ABNORMAL LOW (ref 12.0–15.0)
MCH: 26.1 pg (ref 26.0–34.0)
MCHC: 33 g/dL (ref 30.0–36.0)
MCV: 79.3 fL (ref 78.0–100.0)

## 2013-07-26 LAB — PREPARE RBC (CROSSMATCH)

## 2013-07-26 SURGERY — LAPAROTOMY, EXPLORATORY
Anesthesia: General | Wound class: Contaminated

## 2013-07-26 MED ORDER — MORPHINE SULFATE 2 MG/ML IJ SOLN
1.0000 mg | INTRAMUSCULAR | Status: DC | PRN
  Administered 2013-07-26: 2 mg via INTRAVENOUS
  Filled 2013-07-26: qty 1

## 2013-07-26 MED ORDER — LIDOCAINE HCL (CARDIAC) 20 MG/ML IV SOLN
INTRAVENOUS | Status: DC | PRN
  Administered 2013-07-26: 50 mg via INTRAVENOUS

## 2013-07-26 MED ORDER — FAMOTIDINE IN NACL 20-0.9 MG/50ML-% IV SOLN
20.0000 mg | Freq: Two times a day (BID) | INTRAVENOUS | Status: DC
  Administered 2013-07-26 – 2013-07-29 (×7): 20 mg via INTRAVENOUS
  Filled 2013-07-26 (×9): qty 50

## 2013-07-26 MED ORDER — PHENYLEPHRINE HCL 10 MG/ML IJ SOLN
10.0000 mg | INTRAVENOUS | Status: DC | PRN
  Administered 2013-07-26: 100 ug/min via INTRAVENOUS

## 2013-07-26 MED ORDER — LACTATED RINGERS IV SOLN
INTRAVENOUS | Status: DC | PRN
  Administered 2013-07-26 (×2): via INTRAVENOUS

## 2013-07-26 MED ORDER — PROPOFOL 10 MG/ML IV BOLUS
INTRAVENOUS | Status: DC | PRN
  Administered 2013-07-26: 80 mg via INTRAVENOUS

## 2013-07-26 MED ORDER — DEXTROSE-NACL 5-0.45 % IV SOLN
INTRAVENOUS | Status: DC
  Administered 2013-07-26 – 2013-07-27 (×2): via INTRAVENOUS

## 2013-07-26 MED ORDER — ACETAMINOPHEN 10 MG/ML IV SOLN
1000.0000 mg | Freq: Once | INTRAVENOUS | Status: DC | PRN
  Filled 2013-07-26: qty 100

## 2013-07-26 MED ORDER — ROCURONIUM BROMIDE 100 MG/10ML IV SOLN
INTRAVENOUS | Status: DC | PRN
  Administered 2013-07-26: 40 mg via INTRAVENOUS

## 2013-07-26 MED ORDER — POTASSIUM CHLORIDE IN NACL 20-0.9 MEQ/L-% IV SOLN
INTRAVENOUS | Status: DC
  Filled 2013-07-26 (×2): qty 1000

## 2013-07-26 MED ORDER — HEPARIN SODIUM (PORCINE) 5000 UNIT/ML IJ SOLN
5000.0000 [IU] | Freq: Three times a day (TID) | INTRAMUSCULAR | Status: DC
  Administered 2013-07-26 – 2013-08-02 (×20): 5000 [IU] via SUBCUTANEOUS
  Filled 2013-07-26 (×25): qty 1

## 2013-07-26 MED ORDER — DEXTROSE 5 % IV SOLN: 2.0000 g | INTRAVENOUS | Status: DC

## 2013-07-26 MED ORDER — PROMETHAZINE HCL 25 MG/ML IJ SOLN: 6.2500 mg | INTRAMUSCULAR | Status: DC | PRN

## 2013-07-26 MED ORDER — POTASSIUM CHLORIDE IN NACL 20-0.9 MEQ/L-% IV SOLN
INTRAVENOUS | Status: DC | PRN
  Administered 2013-07-26: 10 mL/h via INTRAVENOUS

## 2013-07-26 MED ORDER — INSULIN ASPART 100 UNIT/ML ~~LOC~~ SOLN
0.0000 [IU] | Freq: Three times a day (TID) | SUBCUTANEOUS | Status: DC
  Administered 2013-07-26 – 2013-07-30 (×3): 1 [IU] via SUBCUTANEOUS

## 2013-07-26 MED ORDER — SODIUM CHLORIDE 0.9 % IR SOLN
Status: DC | PRN
  Administered 2013-07-26: 2000 mL

## 2013-07-26 MED ORDER — PHENYLEPHRINE HCL 10 MG/ML IJ SOLN
INTRAMUSCULAR | Status: DC | PRN
  Administered 2013-07-26: 80 ug via INTRAVENOUS

## 2013-07-26 MED ORDER — POTASSIUM CHLORIDE 10 MEQ/100ML IV SOLN
10.0000 meq | INTRAVENOUS | Status: AC
  Administered 2013-07-26 (×2): 10 meq via INTRAVENOUS
  Filled 2013-07-26: qty 200

## 2013-07-26 MED ORDER — FENTANYL CITRATE 0.05 MG/ML IJ SOLN
INTRAMUSCULAR | Status: DC | PRN
  Administered 2013-07-26 (×5): 50 ug via INTRAVENOUS

## 2013-07-26 MED ORDER — MEPERIDINE HCL 50 MG/ML IJ SOLN: 6.2500 mg | INTRAMUSCULAR | Status: DC | PRN

## 2013-07-26 MED ORDER — OXYCODONE HCL 5 MG PO TABS: 5.0000 mg | ORAL_TABLET | Freq: Once | ORAL | Status: DC | PRN

## 2013-07-26 MED ORDER — POTASSIUM CHLORIDE 10 MEQ/50ML IV SOLN
10.0000 meq | INTRAVENOUS | Status: AC
  Administered 2013-07-26 (×2): 10 meq via INTRAVENOUS
  Filled 2013-07-26 (×6): qty 50
  Filled 2013-07-26: qty 100
  Filled 2013-07-26: qty 50

## 2013-07-26 MED ORDER — GLYCOPYRROLATE 0.2 MG/ML IJ SOLN
INTRAMUSCULAR | Status: DC | PRN
  Administered 2013-07-26: 0.6 mg via INTRAVENOUS

## 2013-07-26 MED ORDER — NEOSTIGMINE METHYLSULFATE 1 MG/ML IJ SOLN
INTRAMUSCULAR | Status: DC | PRN
  Administered 2013-07-26: 4 mg via INTRAVENOUS

## 2013-07-26 MED ORDER — POTASSIUM CHLORIDE IN NACL 20-0.9 MEQ/L-% IV SOLN: INTRAVENOUS | Status: DC | PRN

## 2013-07-26 MED ORDER — HYDROMORPHONE HCL PF 1 MG/ML IJ SOLN: 0.2500 mg | INTRAMUSCULAR | Status: DC | PRN

## 2013-07-26 MED ORDER — VANCOMYCIN HCL IN DEXTROSE 1-5 GM/200ML-% IV SOLN
1000.0000 mg | INTRAVENOUS | Status: DC
  Administered 2013-07-26 – 2013-07-28 (×3): 1000 mg via INTRAVENOUS
  Filled 2013-07-26 (×3): qty 200

## 2013-07-26 MED ORDER — OXYCODONE HCL 5 MG/5ML PO SOLN
5.0000 mg | Freq: Once | ORAL | Status: DC | PRN
  Filled 2013-07-26: qty 5

## 2013-07-26 MED ORDER — CEFOXITIN SODIUM-DEXTROSE 1-4 GM-% IV SOLR (PREMIX)
INTRAVENOUS | Status: AC
  Filled 2013-07-26: qty 100

## 2013-07-26 MED ORDER — ONDANSETRON HCL 4 MG/2ML IJ SOLN: 4.0000 mg | Freq: Four times a day (QID) | INTRAMUSCULAR | Status: DC | PRN

## 2013-07-26 MED ORDER — POTASSIUM CHLORIDE 10 MEQ/50ML IV SOLN
10.0000 meq | INTRAVENOUS | Status: AC
  Administered 2013-07-26 (×4): 10 meq via INTRAVENOUS

## 2013-07-26 MED ORDER — FAT EMULSION 20 % IV EMUL
240.0000 mL | INTRAVENOUS | Status: DC
  Filled 2013-07-26: qty 250

## 2013-07-26 MED ORDER — DEXTROSE 5 % IV SOLN
2.0000 g | INTRAVENOUS | Status: DC | PRN
  Administered 2013-07-26: 2 g via INTRAVENOUS

## 2013-07-26 MED ORDER — ONDANSETRON HCL 4 MG PO TABS: 4.0000 mg | ORAL_TABLET | Freq: Four times a day (QID) | ORAL | Status: DC | PRN

## 2013-07-26 MED ORDER — TRACE MINERALS CR-CU-F-FE-I-MN-MO-SE-ZN IV SOLN
INTRAVENOUS | Status: DC
  Filled 2013-07-26: qty 2000

## 2013-07-26 MED ORDER — MIDAZOLAM HCL 5 MG/5ML IJ SOLN
INTRAMUSCULAR | Status: DC | PRN
  Administered 2013-07-26: 2 mg via INTRAVENOUS

## 2013-07-26 SURGICAL SUPPLY — 45 items
APPLICATOR COTTON TIP 6IN STRL (MISCELLANEOUS) IMPLANT
BLADE EXTENDED COATED 6.5IN (ELECTRODE) IMPLANT
BLADE HEX COATED 2.75 (ELECTRODE) ×2 IMPLANT
CANISTER SUCTION 2500CC (MISCELLANEOUS) ×2 IMPLANT
CHLORAPREP W/TINT 26ML (MISCELLANEOUS) ×2 IMPLANT
CLOTH BEACON ORANGE TIMEOUT ST (SAFETY) ×2 IMPLANT
COVER MAYO STAND STRL (DRAPES) ×6 IMPLANT
DRAPE LAPAROSCOPIC ABDOMINAL (DRAPES) ×2 IMPLANT
DRAPE UTILITY XL STRL (DRAPES) ×4 IMPLANT
DRAPE WARM FLUID 44X44 (DRAPE) ×2 IMPLANT
DRSG OPSITE POSTOP 4X10 (GAUZE/BANDAGES/DRESSINGS) ×2 IMPLANT
ELECT REM PT RETURN 9FT ADLT (ELECTROSURGICAL) ×2
ELECTRODE REM PT RTRN 9FT ADLT (ELECTROSURGICAL) ×1 IMPLANT
GLOVE BIOGEL PI IND STRL 7.0 (GLOVE) ×1 IMPLANT
GLOVE BIOGEL PI INDICATOR 7.0 (GLOVE) ×1
GLOVE EUDERMIC 7 POWDERFREE (GLOVE) ×4 IMPLANT
GOWN STRL NON-REIN LRG LVL3 (GOWN DISPOSABLE) IMPLANT
GOWN STRL REIN XL XLG (GOWN DISPOSABLE) ×10 IMPLANT
KIT BASIN OR (CUSTOM PROCEDURE TRAY) ×2 IMPLANT
LIGASURE IMPACT 36 18CM CVD LR (INSTRUMENTS) ×2 IMPLANT
NS IRRIG 1000ML POUR BTL (IV SOLUTION) ×2 IMPLANT
PACK GENERAL/GYN (CUSTOM PROCEDURE TRAY) ×2 IMPLANT
PENCIL BUTTON HOLSTER BLD 10FT (ELECTRODE) ×2 IMPLANT
RELOAD PROXIMATE 75MM BLUE (ENDOMECHANICALS) ×4 IMPLANT
SPONGE GAUZE 4X4 12PLY (GAUZE/BANDAGES/DRESSINGS) ×2 IMPLANT
SPONGE LAP 18X18 X RAY DECT (DISPOSABLE) ×2 IMPLANT
STAPLER GUN LINEAR PROX 60 (STAPLE) ×2 IMPLANT
STAPLER PROXIMATE 75MM BLUE (STAPLE) ×2 IMPLANT
STAPLER VISISTAT 35W (STAPLE) ×2 IMPLANT
SUCTION POOLE TIP (SUCTIONS) ×2 IMPLANT
SUT PDS AB 1 CTX 36 (SUTURE) ×4 IMPLANT
SUT PDS AB 1 TP1 96 (SUTURE) IMPLANT
SUT SILK 2 0 (SUTURE)
SUT SILK 2 0 SH CR/8 (SUTURE) ×2 IMPLANT
SUT SILK 2-0 18XBRD TIE 12 (SUTURE) IMPLANT
SUT SILK 3 0 (SUTURE)
SUT SILK 3 0 SH CR/8 (SUTURE) ×2 IMPLANT
SUT SILK 3-0 18XBRD TIE 12 (SUTURE) IMPLANT
SUT VIC AB 1 CT1 36 (SUTURE) ×2 IMPLANT
SUT VICRYL 2 0 18  UND BR (SUTURE)
SUT VICRYL 2 0 18 UND BR (SUTURE) IMPLANT
TOWEL OR 17X26 10 PK STRL BLUE (TOWEL DISPOSABLE) ×4 IMPLANT
TRAY FOLEY CATH 14FRSI W/METER (CATHETERS) ×2 IMPLANT
YANKAUER SUCT BULB TIP 10FT TU (MISCELLANEOUS) ×4 IMPLANT
YANKAUER SUCT BULB TIP NO VENT (SUCTIONS) IMPLANT

## 2013-07-26 NOTE — Progress Notes (Signed)
Subjective:  Surgery today. Dr. Derrell Lolling. No VT on tele. NSR. On AMIO gtt, metop IV.   GPC in clusters BCX (?IV)  Objective:  Vital Signs in the last 24 hours: Temp:  [97.5 F (36.4 C)-98.8 F (37.1 C)] 97.5 F (36.4 C) (07/28 0335) Pulse Rate:  [59-80] 59 (07/28 0400) Resp:  [14-28] 16 (07/28 0400) BP: (89-120)/(37-58) 120/57 mmHg (07/28 0400) SpO2:  [92 %-99 %] 98 % (07/28 0400)  Intake/Output from previous day: 07/27 0701 - 07/28 0700 In: 2741.1 [I.V.:761.1; IV Piggyback:600; TPN:1380] Out: 1660 [Urine:1210; Emesis/NG output:450]   Physical Exam: General: Frail, well nourished, in no acute distress. Head:  Normocephalic and atraumatic. Lungs: Clear to auscultation and percussion. Heart: Normal S1 and S2.  2/6 S murmur RUSB,no rubs or gallops.  Abdomen: no bowel sounds. Distended Extremities: No clubbing or cyanosis. No edema. Neurologic: Alert and oriented x 3.    Lab Results:  Recent Labs  07/25/13 0345 07/26/13 0430  WBC 15.6* 17.7*  HGB 10.3* 8.6*  PLT 270 174    Recent Labs  07/25/13 0345 07/26/13 0430  NA 138 139  K 4.1 2.6*  CL 106 107  CO2 22 25  GLUCOSE 150* 150*  BUN 15 22  CREATININE 0.74 0.78   No results found for this basename: TROPONINI, CK, MB,  in the last 72 hours Hepatic Function Panel  Recent Labs  07/26/13 0430  PROT 4.3*  ALBUMIN 1.9*  AST 19  ALT 16  ALKPHOS 41  BILITOT 0.3     Imaging: Dg Chest Port 1 View  07/25/2013   *RADIOLOGY REPORT*  Clinical Data: Fever and congestion  PORTABLE CHEST - 1 VIEW  Comparison:  July 23, 2013  Findings:  Tube and catheter positions are unchanged.  No pneumothorax.  There is new effusion on the right with right base atelectasis.  Left lower lobe consolidation remains.  Heart is mildly enlarged with normal pulmonary vascularity.  No adenopathy. There is atherosclerotic change in the aorta. Bones are osteoporotic.  IMPRESSION: Persistent left lower lobe consolidation.  New effusion on the  right with patchy right base atelectasis.  No pneumothorax.  No change in cardiac silhouette.   Original Report Authenticated By: Bretta Bang, M.D.   Dg Abd 2 Views  07/26/2013   *RADIOLOGY REPORT*  Clinical Data: Follow-up small bowel obstruction.  ABDOMEN - 2 VIEW  Comparison: 07/22/2013.  Findings: There are persistent findings compatible with small bowel obstruction with dilated loops of bowel measuring up to 5 cm in the central abdomen and pelvis.  Enteric tube remains present within the stomach.  Severe scoliosis.  On the decubitus film, there is no free air.  Fluid levels are identified.  IMPRESSION: No interval change in small bowel obstruction.   Original Report Authenticated By: Andreas Newport, M.D.   Personally viewed.   Telemetry: NSR, PVC occas, no VT Personally viewed.   Cardiac Studies:  ECHO  - Left ventricle: The cavity size was normal. Systolic function was normal. Wall motion was normal; there were no regional wall motion abnormalities. - Aortic valve: Valve mobility was restricted. Moderate to severe AS - Mitral valve: Moderate regurgitation. - Left atrium: The atrium was moderately dilated. - Right atrium: The atrium was mildly to moderately dilated. - Pulmonary arteries: PA peak pressure: 45mm Hg (S).   Assessment/Plan:  Principal Problem:   Small bowel obstruction Active Problems:   COLLAGENOUS COLITIS   Hypertension   CAD (coronary artery disease)   Hyperlipidemia   Anemia  Heart murmur, systolic   DNR (do not resuscitate)   Aortic stenosis, severe   Arrhythmia   NSTEMI (non-ST elevated myocardial infarction)   Ventricular tachycardia   Hypokalemia   Hyperkalemia   HCAP (healthcare-associated pneumonia)   Acute systolic CHF (congestive heart failure)   Preoperative respiratory examination   -Ex Lap today -Continue with IV amio, Bb -Recent NSTEMI, mod to severe AS with normal EF, prior VT. Continues to be moderate to high risk from a CV  standpoint for surgery however she is optimized at this point. Urgent need for surgery. Proceed. -BCX - GPC clusters - ID. On Vanc. Zosyn. May need TEE in future if clinically stable for procedure. May need line change. Will defer to primary team.   Yuritzy Zehring 07/26/2013, 8:14 AM

## 2013-07-26 NOTE — Progress Notes (Signed)
General surgery attending note:  I have interviewed and examined this patient this morning. I reviewed her hospital course and her imaging studies. Her abdomen is distended but nontender. X-ray showed persistent small bowel obstruction.  Despite her comorbidities, she would like to go ahead with surgical intervention for her small bowel obstruction. I had a long conference with the family this morning. Daughter was present. Son was present. I discussed the indications, details, techniques, numerous risk of the surgery with the patient and her family. She is aware of infection, bleeding, wound healing problems such as hernia, colostomy, injury to adjacent organs, respiratory failure and ventilator dependence. All their questions were answered. They understand all these issues. They agree with this plan.  We'll proceed with exploratory laparotomy for SBO today.   Ariel Hunt. Derrell Lolling, M.D., [facility] General and Minimally invasive Surgery Breast and Colorectal Surgery Office:   [phone] Pager:   [phone]

## 2013-07-26 NOTE — Anesthesia Preprocedure Evaluation (Addendum)
Anesthesia Evaluation  Patient identified by MRN, date of birth, ID band Patient awake    Reviewed: Allergy & Precautions, H&P , NPO status , Patient's Chart, lab work & pertinent test results  Airway Mallampati: II TM Distance: >3 FB Neck ROM: Full    Dental  (+) Dental Advisory Given   Pulmonary asthma , pneumonia -, unresolved,  breath sounds clear to auscultation        Cardiovascular hypertension, Pt. on medications + CAD, + Past MI and +CHF + dysrhythmias Atrial Fibrillation and Ventricular Tachycardia + Valvular Problems/Murmurs AS Rhythm:Regular Rate:Normal + Systolic murmurs    Neuro/Psych negative neurological ROS  negative psych ROS   GI/Hepatic Neg liver ROS, GERD-  Medicated,  Endo/Other  negative endocrine ROS  Renal/GU negative Renal ROS     Musculoskeletal negative musculoskeletal ROS (+)   Abdominal   Peds  Hematology negative hematology ROS (+)   Anesthesia Other Findings   Reproductive/Obstetrics negative OB ROS                          Anesthesia Physical Anesthesia Plan  ASA: IV and emergent  Anesthesia Plan: General   Post-op Pain Management:    Induction: Intravenous  Airway Management Planned: Oral ETT  Additional Equipment: Arterial line  Intra-op Plan:   Post-operative Plan: Post-operative intubation/ventilation  Informed Consent: I have reviewed the patients History and Physical, chart, labs and discussed the procedure including the risks, benefits and alternatives for the proposed anesthesia with the patient or authorized representative who has indicated his/her understanding and acceptance.   Dental advisory given  Plan Discussed with: CRNA  Anesthesia Plan Comments:        Anesthesia Quick Evaluation

## 2013-07-26 NOTE — Progress Notes (Signed)
CRITICAL VALUE ALERT  Critical value received:  + blood cultures, all 4 bottles (2 anerobic, 2 aerobic) with gram + cocci in clusters  Date of notification:  07/26/13  Time of notification:  0445  Critical value read back:yes  Nurse who received alert:  Carlos Levering   MD notified (1st page):  [facility] night floor coverage, L. [place]  Time of first page:  0448  MD notified (2nd page):  Time of second page:  Responding MD:    Time MD responded:

## 2013-07-26 NOTE — Progress Notes (Signed)
Patient interviewed and examined. Agree with the assessment and treatment plan outlined by Ms. Hunt, Ariel.  See my notes attached. Plan surgery for SBO today.   Ariel Hunt. Derrell Lolling, M.D., [facility] General and Minimally invasive Surgery Breast and Colorectal Surgery Office:   [phone] Pager:   [phone]

## 2013-07-26 NOTE — Consult Note (Signed)
INFECTIOUS DISEASE CONSULT NOTE  Date of Admission:  07/17/2013  Date of Consult:  07/26/2013  Reason for Consult: Bacteremia Referring Physician: Waymon Amato  Impression/Recommendation SBO Staph bacteremia (? PIC related)   Would Continue current anbx Recheck BCx Remove PIC F/u abd exam  Comment- her PIC was placed prior to her BCx and her fever. Would removed as potential source especially in light of her having AS. Could consider checking TEE but will wait til she is more stable and confer with her primary team. I am not sure she is a valve surgery candidate.     Thank you so much for this interesting consult,   Johny Sax (pager) [phone] [email or web address]  Ariel Hunt is an 79 y.o. female.  HPI: 79 yo F with hx of moderate AS, previous SBO and collagenous colitis, comes to ED on 7-19 with 2 days of increasing abd pain and distension. She was found to be anemic, and on CT was found to have SBO. She was afebrile and her WBC was normal. Her course was complicated by new afib on 7-21, she was started on heparin for concern over NSTEMI (troponin peak .43 07-19-13). By 7-26 she was up ambulating, having her SBO managed medically (pt did not want surgery).  By 7-26 her WBC had increased to 15.6 and her temp had increased to 102.5. She was started on vanco/zosyn for concern over HCAP.  Today she was found to have 2/2 BCx+ for GPC in clusters.  She underwent surgery today for her SBO with resection, lysis of adhesions.  PIC placed 07-23-13  Past Medical History  Diagnosis Date  . Colitis   . Weight loss   . Diarrhea   . CAD (coronary artery disease)   . Hyperlipidemia   . Hypertension   . Asthma   . GERD (gastroesophageal reflux disease)   . Atrial fibrillation     Past Surgical History  Procedure Laterality Date  . Hernia repair      inguinal  . Coronary angioplasty with stent placement    . Bowel adhesions      several     No Known Allergies  Medications:    Scheduled: . antiseptic oral rinse  15 mL Mouth Rinse QID  . aspirin  300 mg Rectal Daily  . chlorhexidine  15 mL Mouth/Throat BID  . famotidine (PEPCID) IV  20 mg Intravenous Q12H  . heparin  5,000 Units Subcutaneous Q8H  . hydrocortisone sod succinate (SOLU-CORTEF) inj  25 mg Intravenous Q12H  . insulin aspart  0-9 Units Subcutaneous Q8H  . metoprolol  2.5 mg Intravenous Q6H  . mometasone-formoterol  2 puff Inhalation BID  . piperacillin-tazobactam (ZOSYN)  IV  3.375 g Intravenous Q8H  . sodium chloride  10-40 mL Intracatheter Q12H  . sodium chloride  3 mL Intravenous Q12H  . triamcinolone cream  1 application Topical Weekly  . vancomycin  1,000 mg Intravenous Q24H    Total days of antibiotics: 3 (vanco/zosyn)          Social History:  reports that she quit smoking about 22 years ago. Her smoking use included Cigarettes. She smoked 0.00 packs per day. She has never used smokeless tobacco. She reports that she does not drink alcohol or use illicit drugs.  Family History  Problem Relation Age of Onset  . Colon cancer Father   . Breast cancer Sister   . Breast cancer Maternal Aunt     General ROS: pt just out of surgery, no complaints. unable  to do complete ROS. see HPI  Blood pressure 122/76, pulse 69, temperature 97.5 F (36.4 C), temperature source Oral, resp. rate 18, height 5\' 4"  (1.626 m), weight 43.6 kg (96 lb 1.9 oz), SpO2 100.00%. General appearance: alert, cooperative and no distress Eyes: negative findings: pupils equal, round, reactive to light and accomodation Throat: normal findings: oropharynx pink & moist without lesions or evidence of thrush and dark d/c on roof of mouth Neck: no adenopathy and supple, symmetrical, trachea midline Lungs: clear to auscultation bilaterally Heart: regular rate and rhythm and systolic murmur: early systolic 3/6, crescendo at 2nd left intercostal space Abdomen: normal findings: bowel sounds normal Extremities: edema none and LUE  with echymosis, swelling distal to elbow. her PIC is non-tender. appears clean.    Results for orders placed during the hospital encounter of 07/17/13 (from the past 48 hour(s))  GLUCOSE, CAPILLARY     Status: Abnormal   Collection Time    07/24/13  4:12 PM      Result Value Range   Glucose-Capillary 137 (*) 70 - 99 mg/dL  GLUCOSE, CAPILLARY     Status: Abnormal   Collection Time    07/24/13  8:18 PM      Result Value Range   Glucose-Capillary 136 (*) 70 - 99 mg/dL  CBC     Status: Abnormal   Collection Time    07/25/13  3:45 AM      Result Value Range   WBC 15.6 (*) 4.0 - 10.5 K/uL   RBC 4.09  3.87 - 5.11 MIL/uL   Hemoglobin 10.3 (*) 12.0 - 15.0 g/dL   HCT 98.1 (*) 19.1 - 47.8 %   MCV 80.4  78.0 - 100.0 fL   MCH 25.2 (*) 26.0 - 34.0 pg   MCHC 31.3  30.0 - 36.0 g/dL   RDW 29.5 (*) 62.1 - 30.8 %   Platelets 270  150 - 400 K/uL  HEPARIN LEVEL (UNFRACTIONATED)     Status: None   Collection Time    07/25/13  3:45 AM      Result Value Range   Heparin Unfractionated 0.42  0.30 - 0.70 IU/mL   Comment:            IF HEPARIN RESULTS ARE BELOW     EXPECTED VALUES, AND PATIENT     DOSAGE HAS BEEN CONFIRMED,     SUGGEST FOLLOW UP TESTING     OF ANTITHROMBIN III LEVELS.  COMPREHENSIVE METABOLIC PANEL     Status: Abnormal   Collection Time    07/25/13  3:45 AM      Result Value Range   Sodium 138  135 - 145 mEq/L   Potassium 4.1  3.5 - 5.1 mEq/L   Comment: DELTA CHECK NOTED     SLIGHT HEMOLYSIS   Chloride 106  96 - 112 mEq/L   CO2 22  19 - 32 mEq/L   Glucose, Bld 150 (*) 70 - 99 mg/dL   BUN 15  6 - 23 mg/dL   Creatinine, Ser 6.57  0.50 - 1.10 mg/dL   Calcium 7.9 (*) 8.4 - 10.5 mg/dL   Total Protein 5.4 (*) 6.0 - 8.3 g/dL   Albumin 2.7 (*) 3.5 - 5.2 g/dL   AST 25  0 - 37 U/L   ALT 21  0 - 35 U/L   Alkaline Phosphatase 38 (*) 39 - 117 U/L   Total Bilirubin 0.3  0.3 - 1.2 mg/dL   GFR calc non Af Amer 74 (*) >  90 mL/min   GFR calc Af Amer 86 (*) >90 mL/min   Comment:              The eGFR has been calculated     using the CKD EPI equation.     This calculation has not been     validated in all clinical     situations.     eGFR's persistently     <90 mL/min signify     possible Chronic Kidney Disease.  MAGNESIUM     Status: Abnormal   Collection Time    07/25/13  3:45 AM      Result Value Range   Magnesium 2.7 (*) 1.5 - 2.5 mg/dL  PHOSPHORUS     Status: None   Collection Time    07/25/13  3:45 AM      Result Value Range   Phosphorus 2.6  2.3 - 4.6 mg/dL  PRO B NATRIURETIC PEPTIDE     Status: Abnormal   Collection Time    07/25/13  3:45 AM      Result Value Range   Pro B Natriuretic peptide (BNP) 7166.0 (*) 0 - 450 pg/mL  GLUCOSE, CAPILLARY     Status: Abnormal   Collection Time    07/25/13  3:51 AM      Result Value Range   Glucose-Capillary 141 (*) 70 - 99 mg/dL   Comment 1 Documented in Chart     Comment 2 Notify RN    GLUCOSE, CAPILLARY     Status: Abnormal   Collection Time    07/25/13  7:57 AM      Result Value Range   Glucose-Capillary 107 (*) 70 - 99 mg/dL  CULTURE, BLOOD (ROUTINE X 2)     Status: None   Collection Time    07/25/13  8:15 AM      Result Value Range   Specimen Description BLOOD RIGHT ARM     Special Requests BOTTLES DRAWN AEROBIC AND ANAEROBIC 6CC EACH     Culture  Setup Time 07/25/2013 15:17     Culture       Value: GRAM POSITIVE COCCI IN CLUSTERS     Note: Gram Stain Report Called to,Read Back By and Verified With: Rita Ohara RN on 07/26/13 at 04:45 by Christie Nottingham   Report Status PENDING    CULTURE, BLOOD (ROUTINE X 2)     Status: None   Collection Time    07/25/13  8:36 AM      Result Value Range   Specimen Description BLOOD RIGHT HAND     Special Requests BOTTLES DRAWN AEROBIC AND ANAEROBIC 6CC EACH     Culture  Setup Time 07/25/2013 15:17     Culture       Value: GRAM POSITIVE COCCI IN CLUSTERS     Note: Gram Stain Report Called to,Read Back By and Verified With: Rita Ohara RN on 07/26/13 at 04:45 by Christie Nottingham   Report Status PENDING    URINALYSIS, ROUTINE W REFLEX MICROSCOPIC     Status: Abnormal   Collection Time    07/25/13  8:55 AM      Result Value Range   Color, Urine YELLOW  YELLOW   APPearance CLEAR  CLEAR   Specific Gravity, Urine 1.014  1.005 - 1.030   pH 5.0  5.0 - 8.0   Glucose, UA NEGATIVE  NEGATIVE mg/dL   Hgb urine dipstick MODERATE (*) NEGATIVE   Bilirubin Urine NEGATIVE  NEGATIVE   Ketones,  ur NEGATIVE  NEGATIVE mg/dL   Protein, ur NEGATIVE  NEGATIVE mg/dL   Urobilinogen, UA 0.2  0.0 - 1.0 mg/dL   Nitrite NEGATIVE  NEGATIVE   Leukocytes, UA TRACE (*) NEGATIVE  URINE MICROSCOPIC-ADD ON     Status: Abnormal   Collection Time    07/25/13  8:55 AM      Result Value Range   Squamous Epithelial / LPF RARE  RARE   WBC, UA 0-2  <3 WBC/hpf   RBC / HPF 11-20  <3 RBC/hpf   Bacteria, UA FEW (*) RARE   Urine-Other MUCOUS PRESENT    GLUCOSE, CAPILLARY     Status: Abnormal   Collection Time    07/25/13  4:00 PM      Result Value Range   Glucose-Capillary 152 (*) 70 - 99 mg/dL  GLUCOSE, CAPILLARY     Status: Abnormal   Collection Time    07/25/13 11:21 PM      Result Value Range   Glucose-Capillary 135 (*) 70 - 99 mg/dL   Comment 1 Notify RN     Comment 2 Documented in Chart    CBC     Status: Abnormal   Collection Time    07/26/13  4:30 AM      Result Value Range   WBC 17.7 (*) 4.0 - 10.5 K/uL   RBC 3.29 (*) 3.87 - 5.11 MIL/uL   Hemoglobin 8.6 (*) 12.0 - 15.0 g/dL   HCT 11.9 (*) 14.7 - 82.9 %   MCV 79.3  78.0 - 100.0 fL   MCH 26.1  26.0 - 34.0 pg   MCHC 33.0  30.0 - 36.0 g/dL   RDW 56.2 (*) 13.0 - 86.5 %   Platelets 174  150 - 400 K/uL   Comment: DELTA CHECK NOTED     REPEATED TO VERIFY     SPECIMEN CHECKED FOR CLOTS  COMPREHENSIVE METABOLIC PANEL     Status: Abnormal   Collection Time    07/26/13  4:30 AM      Result Value Range   Sodium 139  135 - 145 mEq/L   Potassium 2.6 (*) 3.5 - 5.1 mEq/L   Comment: DELTA CHECK NOTED     CRITICAL RESULT CALLED TO,  READ BACK BY AND VERIFIED WITH:     L MCATEE AT 0513 ON 07.28.2014 BY NBROOKS   Chloride 107  96 - 112 mEq/L   CO2 25  19 - 32 mEq/L   Glucose, Bld 150 (*) 70 - 99 mg/dL   BUN 22  6 - 23 mg/dL   Creatinine, Ser 7.84  0.50 - 1.10 mg/dL   Calcium 7.3 (*) 8.4 - 10.5 mg/dL   Total Protein 4.3 (*) 6.0 - 8.3 g/dL   Albumin 1.9 (*) 3.5 - 5.2 g/dL   AST 19  0 - 37 U/L   ALT 16  0 - 35 U/L   Alkaline Phosphatase 41  39 - 117 U/L   Total Bilirubin 0.3  0.3 - 1.2 mg/dL   GFR calc non Af Amer 73 (*) >90 mL/min   GFR calc Af Amer 85 (*) >90 mL/min   Comment:            The eGFR has been calculated     using the CKD EPI equation.     This calculation has not been     validated in all clinical     situations.     eGFR's persistently     <90 mL/min  signify     possible Chronic Kidney Disease.  MAGNESIUM     Status: None   Collection Time    07/26/13  4:30 AM      Result Value Range   Magnesium 2.5  1.5 - 2.5 mg/dL  PHOSPHORUS     Status: None   Collection Time    07/26/13  4:30 AM      Result Value Range   Phosphorus 2.7  2.3 - 4.6 mg/dL  DIFFERENTIAL     Status: Abnormal   Collection Time    07/26/13  4:30 AM      Result Value Range   Neutrophils Relative % 91 (*) 43 - 77 %   Neutro Abs 16.1 (*) 1.7 - 7.7 K/uL   Lymphocytes Relative 4 (*) 12 - 46 %   Lymphs Abs 0.7  0.7 - 4.0 K/uL   Monocytes Relative 5  3 - 12 %   Monocytes Absolute 1.0  0.1 - 1.0 K/uL   Eosinophils Relative 0  0 - 5 %   Eosinophils Absolute 0.0  0.0 - 0.7 K/uL   Basophils Relative 0  0 - 1 %   Basophils Absolute 0.0  0.0 - 0.1 K/uL  TRIGLYCERIDES     Status: None   Collection Time    07/26/13  4:30 AM      Result Value Range   Triglycerides 58  <150 mg/dL  PREALBUMIN     Status: Abnormal   Collection Time    07/26/13  4:30 AM      Result Value Range   Prealbumin 8.0 (*) 17.0 - 34.0 mg/dL  GLUCOSE, CAPILLARY     Status: Abnormal   Collection Time    07/26/13  8:13 AM      Result Value Range    Glucose-Capillary 113 (*) 70 - 99 mg/dL   Comment 1 Documented in Chart     Comment 2 Notify RN    POCT I-STAT 4, (NA,K, GLUC, HGB,HCT)     Status: Abnormal   Collection Time    07/26/13 10:40 AM      Result Value Range   Sodium 139  135 - 145 mEq/L   Potassium 3.3 (*) 3.5 - 5.1 mEq/L   Glucose, Bld 137 (*) 70 - 99 mg/dL   HCT 04.5 (*) 40.9 - 81.1 %   Hemoglobin 10.2 (*) 12.0 - 15.0 g/dL  TYPE AND SCREEN     Status: None   Collection Time    07/26/13 12:00 PM      Result Value Range   ABO/RH(D) A POS     Antibody Screen NEG     Sample Expiration 07/29/2013     Unit Number [id number]     Blood Component Type RED CELLS,LR     Unit division 00     Status of Unit REL FROM [facility]     Transfusion Status OK TO TRANSFUSE     Crossmatch Result Compatible     Unit Number [id number]     Blood Component Type RED CELLS,LR     Unit division 00     Status of Unit REL FROM [facility]     Transfusion Status OK TO TRANSFUSE     Crossmatch Result Compatible     Unit Number [id number]     Blood Component Type RED CELLS,LR     Unit division 00     Status of Unit ALLOCATED     Transfusion Status OK TO TRANSFUSE  Crossmatch Result Compatible     Unit Number [id number]     Blood Component Type RED CELLS,LR     Unit division 00     Status of Unit ALLOCATED     Transfusion Status OK TO TRANSFUSE     Crossmatch Result Compatible    PREPARE RBC (CROSSMATCH)     Status: None   Collection Time    07/26/13 12:00 PM      Result Value Range   Order Confirmation ORDER PROCESSED BY BLOOD BANK        Component Value Date/Time   SDES BLOOD RIGHT HAND 07/25/2013 0836   SPECREQUEST BOTTLES DRAWN AEROBIC AND ANAEROBIC [facility] EACH 07/25/2013 0836   CULT  Value: GRAM POSITIVE COCCI IN CLUSTERS Note: Gram Stain Report Called to,Read Back By and Verified With: Rita Ohara RN on 07/26/13 at 04:45 by Christie Nottingham 07/25/2013 0836   REPTSTATUS PENDING 07/25/2013 0836   Dg Chest Port 1 View  07/25/2013    *RADIOLOGY REPORT*  Clinical Data: Fever and congestion  PORTABLE CHEST - 1 VIEW  Comparison:  July 23, 2013  Findings:  Tube and catheter positions are unchanged.  No pneumothorax.  There is new effusion on the right with right base atelectasis.  Left lower lobe consolidation remains.  Heart is mildly enlarged with normal pulmonary vascularity.  No adenopathy. There is atherosclerotic change in the aorta. Bones are osteoporotic.  IMPRESSION: Persistent left lower lobe consolidation.  New effusion on the right with patchy right base atelectasis.  No pneumothorax.  No change in cardiac silhouette.   Original Report Authenticated By: Bretta Bang, M.D.   Dg Abd 2 Views  07/26/2013   *RADIOLOGY REPORT*  Clinical Data: Follow-up small bowel obstruction.  ABDOMEN - 2 VIEW  Comparison: 07/22/2013.  Findings: There are persistent findings compatible with small bowel obstruction with dilated loops of bowel measuring up to 5 cm in the central abdomen and pelvis.  Enteric tube remains present within the stomach.  Severe scoliosis.  On the decubitus film, there is no free air.  Fluid levels are identified.  IMPRESSION: No interval change in small bowel obstruction.   Original Report Authenticated By: Andreas Newport, M.D.   Recent Results (from the past 240 hour(s))  MRSA PCR SCREENING     Status: None   Collection Time    07/19/13 11:47 AM      Result Value Range Status   MRSA by PCR NEGATIVE  NEGATIVE Final   Comment:            The GeneXpert MRSA Assay (FDA     approved for NASAL specimens     only), is one component of a     comprehensive MRSA colonization     surveillance program. It is not     intended to diagnose MRSA     infection nor to guide or     monitor treatment for     MRSA infections.  CULTURE, BLOOD (ROUTINE X 2)     Status: None   Collection Time    07/25/13  8:15 AM      Result Value Range Status   Specimen Description BLOOD RIGHT ARM   Final   Special Requests BOTTLES DRAWN  AEROBIC AND ANAEROBIC [facility] EACH   Final   Culture  Setup Time 07/25/2013 15:17   Final   Culture     Final   Value: GRAM POSITIVE COCCI IN CLUSTERS     Note: Gram Stain Report Called to,Read  Back By and Verified With: Rita Ohara RN on 07/26/13 at 04:45 by Christie Nottingham   Report Status PENDING   Incomplete  CULTURE, BLOOD (ROUTINE X 2)     Status: None   Collection Time    07/25/13  8:36 AM      Result Value Range Status   Specimen Description BLOOD RIGHT HAND   Final   Special Requests BOTTLES DRAWN AEROBIC AND ANAEROBIC [facility] EACH   Final   Culture  Setup Time 07/25/2013 15:17   Final   Culture     Final   Value: GRAM POSITIVE COCCI IN CLUSTERS     Note: Gram Stain Report Called to,Read Back By and Verified With: Rita Ohara RN on 07/26/13 at 04:45 by Christie Nottingham   Report Status PENDING   Incomplete      07/26/2013, 2:37 PM     LOS: 9 days

## 2013-07-26 NOTE — Op Note (Signed)
Patient Name:           Ariel Hunt   Date of Surgery:        07/26/2013  Pre op Diagnosis:      Recurrent small bowel obstruction  Post op Diagnosis:    Recurrent small bowel obstruction secondary to chronic stricture and adhesions  Procedure:                 Exploratory laparotomy, lysis of adhesions, small bowel resection  Surgeon:                     Angelia Mould. Derrell Lolling, M.D., FACS  Assistant:                      Doristine Mango, [name]  Operative Indications:   This is an 79 year old female who has a past history of appendectomy, hysterectomy, and laparotomy for small bowel obstruction.She was admitted 9 days ago with a small bowel obstruction but declined surgery after it became apparent that this was not resolving. She and her family and I requested that we go ahead with surgery. Chest significant comorbidities including hospital-acquired pneumonia, protein calorie malnutrition, anemia requiring transfusion, moderately severe aortic stenosis and cardiac arrhythmias.  Operative Findings:       The patient had a small bowel obstruction secondary to adhesions in the mid small bowel. There was an area where the small bowel was clearly obstructed and there was a chronic stricture that did not open up and required resection. Just distal to this area there also appeared to be a small bowel anastomosis which was somewhat tangled and so we elected to resect both  the stricture and the small bowel anastomosis. This involved resection of 2 feet or less of mid small bowel. Primary anastomosis was created. There were moderately severe adhesions and  it took about 45 minutes to take these  down to clarify the anatomy. The uterus appeared to be surgically absent. There were moderate ascites which were clear and not infected. There was moderate stool in the colon.  Procedure in Detail:          Following the induction of general endotracheal anesthesia the patient's abdomen was prepped and draped in a sterile  fashion. Additional intravenous antibiotics were given. Surgical time out was performed. A midline incision was made from below the umbilicus to the suprapubic area, through a previous scar. She had no subcutaneous adipose tissue. The fascia was incised in the midline and we slowly entered the abdomen. We spent some time taking down adhesions under the incision. We eventually created a free space both above and below. Once we took the adhesions down inferiorly off the bladder we could see all the way down to the pelvis and there was no pathology there. Once we took all the adhesions down above we could feel the stomach and positioned the NG tube. We then ran the small bowel from the ligament of Treitz to the ileocecal valve. Again we took down numerous adhesions in doing this and identified a chronic stricture secondary to adhesions in the mid small bowel. There was a tangled small bowel anastomosis just distal to this with numerous sutures. I decided to resect that area since they were close to each other and in tandem. The dilated small bowel proximal to the obstruction was transected with a GIA stapling device. The collapsed bowel distal was transected with a GIA stapling device. The small bowel mesentery was taken down  conservatively with the LigaSure device and  the specimen was removed. Anastomosis was created between the proximal and distal limbs of the small bowel with a GIA stapling device. The mucosa was pink and healthy on both sides. The common defect was closed with a TA 60 stapling device. We then changed our gowns, gloves and instruments and redraped around the wound. A few 3-0 silk sutures were placed to reinforce the staple line critical points. The mesentery was closed with interrupted sutures of 3-0 silk. The abdomen was irrigated with saline. We ran the small bowel one more time and it looked fine. The midline fascia was closed with a running suture of #1 PDS and a few interrupted sutures of #1  Vicryl. The skin was closed loosely with skin staples. Bandages were placed. The patient was taken to recovery in stable condition. EBL 50 cc. Counts correct. Consultations none.     Angelia Mould. Derrell Lolling, M.D., FACS General and Minimally Invasive Surgery Breast and Colorectal Surgery  07/26/2013 12:59 PM

## 2013-07-26 NOTE — Consult Note (Signed)
INFECTIOUS DISEASE CONSULT NOTE  Date of Admission:  07/17/2013  Date of Consult:  07/26/2013  Reason for Consult: Bacteremia Referring Physician: Waymon Amato  Impression/Recommendation SBO Staph bacteremia (? PIC related)   Would Continue current anbx Recheck BCx Remove PIC F/u abd exam  Comment- her PIC was placed prior to her BCx and her fever. Would removed as potential source especially in light of her having AS. Could consider checking TEE but will wait til she is more stable and confer with her primary team. I am not sure she is a valve surgery candidate.     Thank you so much for this interesting consult,   Johny Sax (pager) [phone] [email or web address]  Ariel Hunt is an 79 y.o. female.  HPI: 79 yo F with hx of moderate AS, previous SBO and collagenous colitis, comes to ED on 7-19 with 2 days of increasing abd pain and distension. She was found to be anemic, and on CT was found to have SBO. She was afebrile and her WBC was normal. Her course was complicated by new afib on 7-21, she was started on heparin for concern over NSTEMI (troponin peak .43 07-19-13). By 7-26 she was up ambulating, having her SBO managed medically (pt did not want surgery).  By 7-26 her WBC had increased to 15.6 and her temp had increased to 102.5. She was started on vanco/zosyn for concern over HCAP.  Today she was found to have 2/2 BCx+ for GPC in clusters.  She underwent surgery today for her SBO with resection, lysis of adhesions.  PIC placed 07-23-13  Past Medical History  Diagnosis Date  . Colitis   . Weight loss   . Diarrhea   . CAD (coronary artery disease)   . Hyperlipidemia   . Hypertension   . Asthma   . GERD (gastroesophageal reflux disease)   . Atrial fibrillation     Past Surgical History  Procedure Laterality Date  . Hernia repair      inguinal  . Coronary angioplasty with stent placement    . Bowel adhesions      several     No Known Allergies  Medications:    Scheduled: . antiseptic oral rinse  15 mL Mouth Rinse QID  . aspirin  300 mg Rectal Daily  . chlorhexidine  15 mL Mouth/Throat BID  . famotidine (PEPCID) IV  20 mg Intravenous Q12H  . heparin  5,000 Units Subcutaneous Q8H  . hydrocortisone sod succinate (SOLU-CORTEF) inj  25 mg Intravenous Q12H  . insulin aspart  0-9 Units Subcutaneous Q8H  . metoprolol  2.5 mg Intravenous Q6H  . mometasone-formoterol  2 puff Inhalation BID  . piperacillin-tazobactam (ZOSYN)  IV  3.375 g Intravenous Q8H  . potassium chloride  10 mEq Intravenous Q1 Hr x 4  . sodium chloride  10-40 mL Intracatheter Q12H  . sodium chloride  3 mL Intravenous Q12H  . triamcinolone cream  1 application Topical Weekly  . vancomycin  1,000 mg Intravenous Q24H    Total days of antibiotics: 3 (vanco/zosyn)          Social History:  reports that she quit smoking about 22 years ago. Her smoking use included Cigarettes. She smoked 0.00 packs per day. She has never used smokeless tobacco. She reports that she does not drink alcohol or use illicit drugs.  Family History  Problem Relation Age of Onset  . Colon cancer Father   . Breast cancer Sister   . Breast cancer Maternal Aunt  General ROS: pt just out of surgery, no complaints. unable to do complete ROS. see HPI  Blood pressure 122/76, pulse 69, temperature 97.5 F (36.4 C), temperature source Oral, resp. rate 18, height 5\' 4"  (1.626 m), weight 43.6 kg (96 lb 1.9 oz), SpO2 100.00%. General appearance: alert, cooperative and no distress Eyes: negative findings: pupils equal, round, reactive to light and accomodation Throat: normal findings: oropharynx pink & moist without lesions or evidence of thrush and dark d/c on roof of mouth Neck: no adenopathy and supple, symmetrical, trachea midline Lungs: clear to auscultation bilaterally Heart: regular rate and rhythm and systolic murmur: early systolic 3/6, crescendo at 2nd left intercostal space Abdomen: normal findings:  bowel sounds normal Extremities: edema none and LUE with echymosis, swelling distal to elbow. her PIC is non-tender. appears clean.    Results for orders placed during the hospital encounter of 07/17/13 (from the past 48 hour(s))  GLUCOSE, CAPILLARY     Status: Abnormal   Collection Time    07/24/13  4:12 PM      Result Value Range   Glucose-Capillary 137 (*) 70 - 99 mg/dL  GLUCOSE, CAPILLARY     Status: Abnormal   Collection Time    07/24/13  8:18 PM      Result Value Range   Glucose-Capillary 136 (*) 70 - 99 mg/dL  CBC     Status: Abnormal   Collection Time    07/25/13  3:45 AM      Result Value Range   WBC 15.6 (*) 4.0 - 10.5 K/uL   RBC 4.09  3.87 - 5.11 MIL/uL   Hemoglobin 10.3 (*) 12.0 - 15.0 g/dL   HCT 16.1 (*) 09.6 - 04.5 %   MCV 80.4  78.0 - 100.0 fL   MCH 25.2 (*) 26.0 - 34.0 pg   MCHC 31.3  30.0 - 36.0 g/dL   RDW 40.9 (*) 81.1 - 91.4 %   Platelets 270  150 - 400 K/uL  HEPARIN LEVEL (UNFRACTIONATED)     Status: None   Collection Time    07/25/13  3:45 AM      Result Value Range   Heparin Unfractionated 0.42  0.30 - 0.70 IU/mL   Comment:            IF HEPARIN RESULTS ARE BELOW     EXPECTED VALUES, AND PATIENT     DOSAGE HAS BEEN CONFIRMED,     SUGGEST FOLLOW UP TESTING     OF ANTITHROMBIN III LEVELS.  COMPREHENSIVE METABOLIC PANEL     Status: Abnormal   Collection Time    07/25/13  3:45 AM      Result Value Range   Sodium 138  135 - 145 mEq/L   Potassium 4.1  3.5 - 5.1 mEq/L   Comment: DELTA CHECK NOTED     SLIGHT HEMOLYSIS   Chloride 106  96 - 112 mEq/L   CO2 22  19 - 32 mEq/L   Glucose, Bld 150 (*) 70 - 99 mg/dL   BUN 15  6 - 23 mg/dL   Creatinine, Ser 7.82  0.50 - 1.10 mg/dL   Calcium 7.9 (*) 8.4 - 10.5 mg/dL   Total Protein 5.4 (*) 6.0 - 8.3 g/dL   Albumin 2.7 (*) 3.5 - 5.2 g/dL   AST 25  0 - 37 U/L   ALT 21  0 - 35 U/L   Alkaline Phosphatase 38 (*) 39 - 117 U/L   Total Bilirubin 0.3  0.3 - 1.2  mg/dL   GFR calc non Af Amer 74 (*) >90 mL/min    GFR calc Af Amer 86 (*) >90 mL/min   Comment:            The eGFR has been calculated     using the CKD EPI equation.     This calculation has not been     validated in all clinical     situations.     eGFR's persistently     <90 mL/min signify     possible Chronic Kidney Disease.  MAGNESIUM     Status: Abnormal   Collection Time    07/25/13  3:45 AM      Result Value Range   Magnesium 2.7 (*) 1.5 - 2.5 mg/dL  PHOSPHORUS     Status: None   Collection Time    07/25/13  3:45 AM      Result Value Range   Phosphorus 2.6  2.3 - 4.6 mg/dL  PRO B NATRIURETIC PEPTIDE     Status: Abnormal   Collection Time    07/25/13  3:45 AM      Result Value Range   Pro B Natriuretic peptide (BNP) 7166.0 (*) 0 - 450 pg/mL  GLUCOSE, CAPILLARY     Status: Abnormal   Collection Time    07/25/13  3:51 AM      Result Value Range   Glucose-Capillary 141 (*) 70 - 99 mg/dL   Comment 1 Documented in Chart     Comment 2 Notify RN    GLUCOSE, CAPILLARY     Status: Abnormal   Collection Time    07/25/13  7:57 AM      Result Value Range   Glucose-Capillary 107 (*) 70 - 99 mg/dL  CULTURE, BLOOD (ROUTINE X 2)     Status: None   Collection Time    07/25/13  8:15 AM      Result Value Range   Specimen Description BLOOD RIGHT ARM     Special Requests BOTTLES DRAWN AEROBIC AND ANAEROBIC 6CC EACH     Culture  Setup Time 07/25/2013 15:17     Culture       Value: GRAM POSITIVE COCCI IN CLUSTERS     Note: Gram Stain Report Called to,Read Back By and Verified With: Rita Ohara RN on 07/26/13 at 04:45 by Christie Nottingham   Report Status PENDING    CULTURE, BLOOD (ROUTINE X 2)     Status: None   Collection Time    07/25/13  8:36 AM      Result Value Range   Specimen Description BLOOD RIGHT HAND     Special Requests BOTTLES DRAWN AEROBIC AND ANAEROBIC [facility] EACH     Culture  Setup Time 07/25/2013 15:17     Culture       Value: GRAM POSITIVE COCCI IN CLUSTERS     Note: Gram Stain Report Called to,Read Back By and  Verified With: Rita Ohara RN on 07/26/13 at 04:45 by Christie Nottingham   Report Status PENDING    URINALYSIS, ROUTINE W REFLEX MICROSCOPIC     Status: Abnormal   Collection Time    07/25/13  8:55 AM      Result Value Range   Color, Urine YELLOW  YELLOW   APPearance CLEAR  CLEAR   Specific Gravity, Urine 1.014  1.005 - 1.030   pH 5.0  5.0 - 8.0   Glucose, UA NEGATIVE  NEGATIVE mg/dL   Hgb urine dipstick MODERATE (*) NEGATIVE  Bilirubin Urine NEGATIVE  NEGATIVE   Ketones, ur NEGATIVE  NEGATIVE mg/dL   Protein, ur NEGATIVE  NEGATIVE mg/dL   Urobilinogen, UA 0.2  0.0 - 1.0 mg/dL   Nitrite NEGATIVE  NEGATIVE   Leukocytes, UA TRACE (*) NEGATIVE  URINE MICROSCOPIC-ADD ON     Status: Abnormal   Collection Time    07/25/13  8:55 AM      Result Value Range   Squamous Epithelial / LPF RARE  RARE   WBC, UA 0-2  <3 WBC/hpf   RBC / HPF 11-20  <3 RBC/hpf   Bacteria, UA FEW (*) RARE   Urine-Other MUCOUS PRESENT    GLUCOSE, CAPILLARY     Status: Abnormal   Collection Time    07/25/13  4:00 PM      Result Value Range   Glucose-Capillary 152 (*) 70 - 99 mg/dL  GLUCOSE, CAPILLARY     Status: Abnormal   Collection Time    07/25/13 11:21 PM      Result Value Range   Glucose-Capillary 135 (*) 70 - 99 mg/dL   Comment 1 Notify RN     Comment 2 Documented in Chart    CBC     Status: Abnormal   Collection Time    07/26/13  4:30 AM      Result Value Range   WBC 17.7 (*) 4.0 - 10.5 K/uL   RBC 3.29 (*) 3.87 - 5.11 MIL/uL   Hemoglobin 8.6 (*) 12.0 - 15.0 g/dL   HCT 16.1 (*) 09.6 - 04.5 %   MCV 79.3  78.0 - 100.0 fL   MCH 26.1  26.0 - 34.0 pg   MCHC 33.0  30.0 - 36.0 g/dL   RDW 40.9 (*) 81.1 - 91.4 %   Platelets 174  150 - 400 K/uL   Comment: DELTA CHECK NOTED     REPEATED TO VERIFY     SPECIMEN CHECKED FOR CLOTS  COMPREHENSIVE METABOLIC PANEL     Status: Abnormal   Collection Time    07/26/13  4:30 AM      Result Value Range   Sodium 139  135 - 145 mEq/L   Potassium 2.6 (*) 3.5 - 5.1 mEq/L    Comment: DELTA CHECK NOTED     CRITICAL RESULT CALLED TO, READ BACK BY AND VERIFIED WITH:     L MCATEE AT 0513 ON 07.28.2014 BY NBROOKS   Chloride 107  96 - 112 mEq/L   CO2 25  19 - 32 mEq/L   Glucose, Bld 150 (*) 70 - 99 mg/dL   BUN 22  6 - 23 mg/dL   Creatinine, Ser 7.82  0.50 - 1.10 mg/dL   Calcium 7.3 (*) 8.4 - 10.5 mg/dL   Total Protein 4.3 (*) 6.0 - 8.3 g/dL   Albumin 1.9 (*) 3.5 - 5.2 g/dL   AST 19  0 - 37 U/L   ALT 16  0 - 35 U/L   Alkaline Phosphatase 41  39 - 117 U/L   Total Bilirubin 0.3  0.3 - 1.2 mg/dL   GFR calc non Af Amer 73 (*) >90 mL/min   GFR calc Af Amer 85 (*) >90 mL/min   Comment:            The eGFR has been calculated     using the CKD EPI equation.     This calculation has not been     validated in all clinical     situations.  eGFR's persistently     <90 mL/min signify     possible Chronic Kidney Disease.  MAGNESIUM     Status: None   Collection Time    07/26/13  4:30 AM      Result Value Range   Magnesium 2.5  1.5 - 2.5 mg/dL  PHOSPHORUS     Status: None   Collection Time    07/26/13  4:30 AM      Result Value Range   Phosphorus 2.7  2.3 - 4.6 mg/dL  DIFFERENTIAL     Status: Abnormal   Collection Time    07/26/13  4:30 AM      Result Value Range   Neutrophils Relative % 91 (*) 43 - 77 %   Neutro Abs 16.1 (*) 1.7 - 7.7 K/uL   Lymphocytes Relative 4 (*) 12 - 46 %   Lymphs Abs 0.7  0.7 - 4.0 K/uL   Monocytes Relative 5  3 - 12 %   Monocytes Absolute 1.0  0.1 - 1.0 K/uL   Eosinophils Relative 0  0 - 5 %   Eosinophils Absolute 0.0  0.0 - 0.7 K/uL   Basophils Relative 0  0 - 1 %   Basophils Absolute 0.0  0.0 - 0.1 K/uL  TRIGLYCERIDES     Status: None   Collection Time    07/26/13  4:30 AM      Result Value Range   Triglycerides 58  <150 mg/dL  PREALBUMIN     Status: Abnormal   Collection Time    07/26/13  4:30 AM      Result Value Range   Prealbumin 8.0 (*) 17.0 - 34.0 mg/dL  GLUCOSE, CAPILLARY     Status: Abnormal   Collection Time     07/26/13  8:13 AM      Result Value Range   Glucose-Capillary 113 (*) 70 - 99 mg/dL   Comment 1 Documented in Chart     Comment 2 Notify RN    POCT I-STAT 4, (NA,K, GLUC, HGB,HCT)     Status: Abnormal   Collection Time    07/26/13 10:40 AM      Result Value Range   Sodium 139  135 - 145 mEq/L   Potassium 3.3 (*) 3.5 - 5.1 mEq/L   Glucose, Bld 137 (*) 70 - 99 mg/dL   HCT 16.1 (*) 09.6 - 04.5 %   Hemoglobin 10.2 (*) 12.0 - 15.0 g/dL  TYPE AND SCREEN     Status: None   Collection Time    07/26/13 12:00 PM      Result Value Range   ABO/RH(D) A POS     Antibody Screen NEG     Sample Expiration 07/29/2013     Unit Number [id number]     Blood Component Type RED CELLS,LR     Unit division 00     Status of Unit REL FROM [facility]     Transfusion Status OK TO TRANSFUSE     Crossmatch Result Compatible     Unit Number [id number]     Blood Component Type RED CELLS,LR     Unit division 00     Status of Unit REL FROM [facility]     Transfusion Status OK TO TRANSFUSE     Crossmatch Result Compatible     Unit Number [id number]     Blood Component Type RED CELLS,LR     Unit division 00     Status of Unit ALLOCATED     Transfusion  Status OK TO TRANSFUSE     Crossmatch Result Compatible     Unit Number [id number]     Blood Component Type RED CELLS,LR     Unit division 00     Status of Unit ALLOCATED     Transfusion Status OK TO TRANSFUSE     Crossmatch Result Compatible    PREPARE RBC (CROSSMATCH)     Status: None   Collection Time    07/26/13 12:00 PM      Result Value Range   Order Confirmation ORDER PROCESSED BY BLOOD BANK        Component Value Date/Time   SDES BLOOD RIGHT HAND 07/25/2013 0836   SPECREQUEST BOTTLES DRAWN AEROBIC AND ANAEROBIC [facility] EACH 07/25/2013 0836   CULT  Value: GRAM POSITIVE COCCI IN CLUSTERS Note: Gram Stain Report Called to,Read Back By and Verified With: Rita Ohara RN on 07/26/13 at 04:45 by Christie Nottingham 07/25/2013 0836   REPTSTATUS PENDING 07/25/2013  0836   Dg Chest Port 1 View  07/25/2013   *RADIOLOGY REPORT*  Clinical Data: Fever and congestion  PORTABLE CHEST - 1 VIEW  Comparison:  July 23, 2013  Findings:  Tube and catheter positions are unchanged.  No pneumothorax.  There is new effusion on the right with right base atelectasis.  Left lower lobe consolidation remains.  Heart is mildly enlarged with normal pulmonary vascularity.  No adenopathy. There is atherosclerotic change in the aorta. Bones are osteoporotic.  IMPRESSION: Persistent left lower lobe consolidation.  New effusion on the right with patchy right base atelectasis.  No pneumothorax.  No change in cardiac silhouette.   Original Report Authenticated By: Bretta Bang, M.D.   Dg Abd 2 Views  07/26/2013   *RADIOLOGY REPORT*  Clinical Data: Follow-up small bowel obstruction.  ABDOMEN - 2 VIEW  Comparison: 07/22/2013.  Findings: There are persistent findings compatible with small bowel obstruction with dilated loops of bowel measuring up to 5 cm in the central abdomen and pelvis.  Enteric tube remains present within the stomach.  Severe scoliosis.  On the decubitus film, there is no free air.  Fluid levels are identified.  IMPRESSION: No interval change in small bowel obstruction.   Original Report Authenticated By: Andreas Newport, M.D.   Recent Results (from the past 240 hour(s))  MRSA PCR SCREENING     Status: None   Collection Time    07/19/13 11:47 AM      Result Value Range Status   MRSA by PCR NEGATIVE  NEGATIVE Final   Comment:            The GeneXpert MRSA Assay (FDA     approved for NASAL specimens     only), is one component of a     comprehensive MRSA colonization     surveillance program. It is not     intended to diagnose MRSA     infection nor to guide or     monitor treatment for     MRSA infections.  CULTURE, BLOOD (ROUTINE X 2)     Status: None   Collection Time    07/25/13  8:15 AM      Result Value Range Status   Specimen Description BLOOD RIGHT ARM    Final   Special Requests BOTTLES DRAWN AEROBIC AND ANAEROBIC [facility] EACH   Final   Culture  Setup Time 07/25/2013 15:17   Final   Culture     Final   Value: GRAM POSITIVE COCCI IN CLUSTERS  Note: Gram Stain Report Called to,Read Back By and Verified With: Rita Ohara RN on 07/26/13 at 04:45 by Christie Nottingham   Report Status PENDING   Incomplete  CULTURE, BLOOD (ROUTINE X 2)     Status: None   Collection Time    07/25/13  8:36 AM      Result Value Range Status   Specimen Description BLOOD RIGHT HAND   Final   Special Requests BOTTLES DRAWN AEROBIC AND ANAEROBIC [facility] EACH   Final   Culture  Setup Time 07/25/2013 15:17   Final   Culture     Final   Value: GRAM POSITIVE COCCI IN CLUSTERS     Note: Gram Stain Report Called to,Read Back By and Verified With: Rita Ohara RN on 07/26/13 at 04:45 by Christie Nottingham   Report Status PENDING   Incomplete      07/26/2013, 3:12 PM     LOS: 9 days          [place] Antimicrobial Management Team Staphylococcus aureus bacteremia   Staphylococcus aureus bacteremia (SAB) is associated with a high rate of complications and mortality.  Specific aspects of clinical management are critical to optimizing the outcome of patients with SAB.  Therefore, the [facility] Antimicrobial Management Team [facility]) has initiated an intervention aimed at improving the management of SAB at [facility].  To do so, Infectious Diseases physicians are providing an evidence-based consult for the management of all patients with SAB.     Yes No Comments  Perform follow-up blood cultures (even if the patient is afebrile) to ensure clearance of bacteremia [x]  []    Remove vascular catheter and obtain follow-up blood cultures after the removal of the catheter [x]  []    Perform echocardiography to evaluate for endocarditis (transthoracic ECHO is 40-50% sensitive, TEE is > 90% sensitive) []  []  Please keep in mind, that neither test can definitively EXCLUDE endocarditis, and that should  clinical suspicion remain high for endocarditis the patient should then still be treated with an "endocarditis" duration of therapy = 6 weeks  Consult electrophysiologist to evaluate implanted cardiac device (pacemaker, ICD) []  [x]    Ensure source control []  []  Have all abscesses been drained effectively? Have deep seeded infections (septic joints or osteomyelitis) had appropriate surgical debridement?  Investigate for "metastatic" sites of infection []  []  Does the patient have ANY symptom or physical exam finding that would suggest a deeper infection (back or neck pain that may be suggestive of vertebral osteomyelitis or epidural abscess, muscle pain that could be a symptom of pyomyositis)?  Keep in mind that for deep seeded infections MRI imaging with contrast is preferred rather than other often insensitive tests such as plain x-rays, especially early in a patient's presentation.  Change antibiotic therapy to __________________ []  []  Beta-lactam antibiotics are preferred for MSSA due to higher cure rates.   If on Vancomycin, goal trough should be 15 - 20 mcg/mL  Estimated duration of IV antibiotic therapy:   []  []  Consult case management for probably prolonged outpatient IV antibiotic therapy

## 2013-07-26 NOTE — Anesthesia Postprocedure Evaluation (Signed)
Anesthesia Post Note  Patient: Ariel Hunt  Procedure(s) Performed: Procedure(s) (LRB): small bowel resection (N/A)  Anesthesia type: General  Patient location: PACU  Post pain: Pain level controlled  Post assessment: Post-op Vital signs reviewed  Last Vitals: BP 122/76  Pulse 64  Temp(Src) 36.6 C (Oral)  Resp 17  Ht 5\' 4"  (1.626 m)  Wt 96 lb 1.9 oz (43.6 kg)  BMI 16.49 kg/m2  SpO2 100%  Post vital signs: Reviewed  Level of consciousness: sedated  Complications: No apparent anesthesia complications

## 2013-07-26 NOTE — Progress Notes (Addendum)
CRITICAL VALUE ALERT  Critical value received:  K 2.6  Date of notification:  07/26/13  Time of notification:  0515   Critical value read back:yes  Nurse who received alert:  Carlos Levering   MD notified (1st page):  [facility] floor coverage, L. Easterwood  Time of first page:  0518  MD notified (2nd page):  Time of second page:  Responding MD: Coralie Carpen   Time MD responded:  0530

## 2013-07-26 NOTE — Progress Notes (Signed)
ANTIBIOTIC CONSULT NOTE - FOLLOW UP  Pharmacy Consult for Vancomycin, Zosyn Indication: pneumonia, GPC bacteremia  No Known Allergies  Patient Measurements: Height: 5\' 4"  (162.6 cm) Weight: 96 lb 1.9 oz (43.6 kg) IBW/kg (Calculated) : 54.7  Vital Signs: Temp: 97.5 F (36.4 C) (07/28 0335) Temp src: Axillary (07/28 0335) BP: 120/57 mmHg (07/28 0400) Pulse Rate: 59 (07/28 0400) Intake/Output from previous day: 07/27 0701 - 07/28 0700 In: 2741.1 [I.V.:761.1; IV Piggyback:600; TPN:1380] Out: 1660 [Urine:1210; Emesis/NG output:450] Intake/Output from this shift:    Labs:  Recent Labs  07/24/13 0530 07/24/13 1200 07/25/13 0345 07/26/13 0430  WBC 7.4  --  15.6* 17.7*  HGB 9.9*  --  10.3* 8.6*  PLT 302  --  270 174  CREATININE 0.67 0.69 0.74 0.78   Estimated Creatinine Clearance: 34.1 ml/min (by C-G formula based on Cr of 0.78). No results found for this basename: Rolm Gala, VANCORANDOM, GENTTROUGH, GENTPEAK, GENTRANDOM, TOBRATROUGH, TOBRAPEAK, TOBRARND, AMIKACINPEAK, AMIKACINTROU, AMIKACIN,  in the last 72 hours    Assessment: 14 yof with h/o SBO (resolved with conservative treatments) admitted 7/19 with SBO. Surgery on board, recommended conservative management, TNA started 7/25. On 7/27, CXR with persistent LLL consolidation, Vanc/Zosyn started for suspected HAP vs aspiration PNA.   Blood cultures (2/2) today grew GPC in clusters.  Patient is afebrile, WBC rising to 17.7K, Scr stable for CG CrCl of 34 ml/min and normalized CrCl of 56 ml/min.  Pt and family agreeable to surgery for SBO today.  Patient is currently on Vancomycin 750 mg IV q24h per CG CrCl but given bacteremia, surgical intervention - will dose Vancomycin based on patient's normalized CrCl  Goal of Therapy:  Vancomycin trough level 15-20 mcg/ml Appropriate renal adjustment of Zosyn  Plan:   Change Vancomycin to 1000 mg IV q24h  Continue Zosyn 3.375 gm IV q8h extended dosing over 4  hours  Pharmacy will f/u   Geoffry Paradise, PharmD, BCPS Pager: [phone] 8:26 AM Pharmacy #: [id number]

## 2013-07-26 NOTE — Progress Notes (Signed)
Will hold off on hanging TNA until new central line in inserted later this evening to replace the existing PICC.  Left arm has dependent edema from elbow to hand.  Site around PICC is soft and without edema.  Rt arm also has edema however it's not as swollen as left.   Pt. Is laying on left side.  Will remove PICC after new central line placed.  Primary RN, Ruby aware.

## 2013-07-26 NOTE — Progress Notes (Addendum)
PARENTERAL NUTRITION CONSULT NOTE - FOLLOW UP  Pharmacy Consult for TNA Indication: SBO, prolonged NPO status  No Known Allergies  Patient Measurements: Height: 5\' 4"  (162.6 cm) Weight: 96 lb 1.9 oz (43.6 kg) IBW/kg (Calculated) : 54.7 Usual Weight: 44 kg   Vital Signs: Temp: 97.5 F (36.4 C) (07/28 0335) Temp src: Axillary (07/28 0335) BP: 120/57 mmHg (07/28 0400) Pulse Rate: 59 (07/28 0400) Intake/Output from previous day: 07/27 0701 - 07/28 0700 In: 2741.1 [I.V.:761.1; IV Piggyback:600; TPN:1380] Out: 1660 [Urine:1210; Emesis/NG output:450] Intake/Output from this shift:    Labs:  Recent Labs  07/24/13 0530 07/25/13 0345 07/26/13 0430  WBC 7.4 15.6* 17.7*  HGB 9.9* 10.3* 8.6*  HCT 31.4* 32.9* 26.1*  PLT 302 270 174     Recent Labs  07/24/13 0530 07/24/13 1200 07/25/13 0345 07/26/13 0430  NA 139 136 138 139  K 5.6* 3.3* 4.1 2.6*  CL 106 104 106 107  CO2 27 26 22 25   GLUCOSE 129* 134* 150* 150*  BUN 11 11 15 22   CREATININE 0.67 0.69 0.74 0.78  CALCIUM 6.8* 8.0* 7.9* 7.3*  MG 1.4*  --  2.7* 2.5  PHOS 2.0*  --  2.6 2.7  PROT 5.2*  --  5.4* 4.3*  ALBUMIN 2.6*  --  2.7* 1.9*  AST 54*  --  25 19  ALT 21  --  21 16  ALKPHOS 31*  --  38* 41  BILITOT 0.2*  --  0.3 0.3  TRIG  --   --   --  58   Estimated Creatinine Clearance: 34.1 ml/min (by C-G formula based on Cr of 0.78).    Recent Labs  07/25/13 0757 07/25/13 1600 07/25/13 2321  GLUCAP 107* 152* 135*    CBGs & Insulin requirements past 24 hours:  - CBGs 150 and under, no insulin on board   Nutritional Goals:  - RD recs 7/25: 1300-1500 Kcal, 50-60 g protein, > 1.2 L fluid - Clinimix E5/15 at goal 2ml/hr with daily lipids will provide 60g protein, 1332kcal  Current nutrition:  - Diet: NPO  - TNA:  Clinimix E 5/15 @ 50 ml/hr - mIVF: D5NS + 20 mEq KCl/L @ KVO  Assessment:  39 yof with h/o SBO (resolved with conservative treatments) admitted 7/19 with SBO. Surgery on board, recommended  conservative management. TNA started 7/25 given prolonged NPO status   Today, 7/28, TNA day# 4 via triple lumen PICC. Abx X-ray with no change in SBO, pt is not improving clinically, pt does not want surgery but may be necessary at this point per surgery. Pulmonary on board to evaluate risk for pulmonary complications following laparotomy. Blood cultures (2/2) growing GPC in clusters, pt is currently on Vanc/Zosyn.   Labs: Electrolytes:  K+ = 2.6, MD ordered 2 runs of KCl. Pharmacy cannot replete KCl if K+ is < 2.8 per TNA protocol but received verbal order from Dr. Waymon Amato to replete K+ as needed. Other lytes wnl.  Renal Function: stable/wnl.  Hepatic Function: stable/wnl Pre-Albumin: 13.8 on 7/25, lab for 7/28 pending Triglycerides: wnl CBGs: trending up some, still 150 or less. Will add SSI today   Plan:    Continue Clinimix E 5/15 at goal rate of 50 ml/hr  TNA to contain IV fat emulsion 20% at 10 ml/hr daily  Standard multivitamins and trace elements daily  Additional 6 runs of KCl (Ok to administer via [employer] per [employer])  Adding sensitive SSI q8h  F/u plans for surgery  TNA labs  Monday/Thursdays  Pharmacy will follow up daily  Geoffry Paradise, PharmD, BCPS Pager: [phone] 8:05 AM Pharmacy #: [id number]

## 2013-07-26 NOTE — Progress Notes (Signed)
PULMONARY  / CRITICAL CARE MEDICINE  Name: Ariel Hunt MRN: [id number] DOB: 10/05/1923    ADMISSION DATE:  07/17/2013 CONSULTATION DATE:  07/25/13  REFERRING MD :  Dr Marcellus Scott PRIMARY SERVICE: [facility]  CHIEF COMPLAINT:  Pre-operative Pulmonary Evaluation  HPI:  79 year old functional female with history of asthma but no COPD and as stated DO NOT RESUSCITATE but with reported moderate-severe aortic  stenosis, coronary artery disease, metabolic syndrome, anemia of chronic disease, cachexia Body mass index is 16.49 kg/(m^2). and normal renal function. She was admitted on 07/17/2013 with a small bowel obstruction that is being conservatively managed medically. Course complicated by hospital-acquired pneumonia for which he is on antibiotics and also tachyarrhythmias with V. tach, SVT and A. fib on 07/18/2013 requiring transient pressors through July 22nd 2014. 07/25/2013 she has had no clinical improvement in her small bowel obstruction and surgery is being entertained. Pulmonary critical care has been consulted for preoperative pulmonary evaluation.   SIGNIFICANT EVENTS / STUDIES:  07/17/2013: Admission with small bowel obstruction  LINES / TUBES: None  CULTURES: BCX2 7/27: GPC in clusters>>>  ANTIBIOTICS:  Subjective    VITAL SIGNS: Temp:  [97.5 F (36.4 C)-98.1 F (36.7 C)] 97.5 F (36.4 C) (07/28 1345) Pulse Rate:  [59-79] 69 (07/28 1415) Resp:  [14-24] 18 (07/28 1415) BP: (96-135)/(46-89) 122/76 mmHg (07/28 1415) SpO2:  [92 %-100 %] 100 % (07/28 1415) Arterial Line BP: (109-123)/(54-63) 121/60 mmHg (07/28 1330) FiO2 (%):  [100 %] 100 % (07/28 1315) HEMODYNAMICS:   VENTILATOR SETTINGS: Vent Mode:  [-] PRVC FiO2 (%):  [100 %] 100 % Set Rate:  [15 bmp] 15 bmp Vt Set:  [500 mL] 500 mL PEEP:  [5 cmH20] 5 cmH20 INTAKE / OUTPUT: Intake/Output     07/27 0701 - 07/28 0700 07/28 0701 - 07/29 0700   P.O.     I.V. (mL/kg) 761.1 (17.5) 1951.8 (44.8)   IV  Piggyback 637.5 250   TPN 1380 186   Total Intake(mL/kg) 2778.6 (63.7) 2387.8 (54.8)   Urine (mL/kg/hr) 1210 (1.2) 305 (0.8)   Emesis/NG output 450 (0.4) 100 (0.3)   Other  350 (0.9)   Total Output 1660 755   Net +1118.6 +1632.8          PHYSICAL EXAMINATION: General:  Frail elderly female lying in bed no distress Neuro:  Hard of hearing but alert and oriented x3. CAM-ICU negative for delirium. Moves all fours HEENT:  Hard of hearing. Neck is supple. No neck nodes. NG tube present Cardiovascular:  Normal sinus rhythm. Blood pressure soft. Lungs:  Exp wheeze.  Abdomen:  Soft but distended. Mid abd dressing CD&I Musculoskeletal:  No cyanosis no clubbing no edema Skin:  Chronic bruises  LABS: PULMONARY No results found for this basename: PHART, PCO2, PCO2ART, PO2, PO2ART, HCO3, TCO2, O2SAT,  in the last 168 hours  CBC  Recent Labs Lab 07/24/13 0530 07/25/13 0345 07/26/13 0430 07/26/13 1040  HGB 9.9* 10.3* 8.6* 10.2*  HCT 31.4* 32.9* 26.1* 30.0*  WBC 7.4 15.6* 17.7*  --   PLT 302 270 174  --     COAGULATION No results found for this basename: INR,  in the last 168 hours  CARDIAC    Recent Labs Lab 07/19/13 1556 07/19/13 2209  TROPONINI 0.34* 0.40*    Recent Labs Lab 07/25/13 0345  PROBNP 7166.0*     CHEMISTRY  Recent Labs Lab 07/22/13 1405 07/23/13 0400 07/23/13 1629 07/24/13 0530 07/24/13 1200 07/25/13 0345 07/26/13 0430 07/26/13 1040  NA  --  144 142 139 136 138 139 139  K 3.2* 2.5* 3.2* 5.6* 3.3* 4.1 2.6* 3.3*  CL  --  107 105 106 104 106 107  --   CO2  --  30 29 27 26 22 25   --   GLUCOSE  --  106* 89 129* 134* 150* 150* 137*  BUN  --  9 9 11 11 15 22   --   CREATININE  --  0.68 0.72 0.67 0.69 0.74 0.78  --   CALCIUM  --  8.0* 7.9* 6.8* 8.0* 7.9* 7.3*  --   MG 2.2 2.1  --  1.4*  --  2.7* 2.5  --   PHOS  --  2.7  --  2.0*  --  2.6 2.7  --     LIVER  Recent Labs Lab 07/22/13 0335 07/23/13 0400 07/24/13 0530 07/25/13 0345  07/26/13 0430  AST 19 19 54* 25 19  ALT 13 15 21 21 16   ALKPHOS 31* 34* 31* 38* 41  BILITOT 0.2* 0.2* 0.2* 0.3 0.3  PROT 4.9* 5.0* 5.2* 5.4* 4.3*  ALBUMIN 2.4* 2.5* 2.6* 2.7* 1.9*     INFECTIOUS No results found for this basename: LATICACIDVEN, PROCALCITON,  in the last 168 hours   ENDOCRINE CBG (last 3)   Recent Labs  07/25/13 2321 07/26/13 0813 07/26/13 1523  GLUCAP 135* 113* 135*    IMAGING x48h  Dg Chest Port 1 View  07/26/2013   *RADIOLOGY REPORT*  Clinical Data: Postop pneumonia  PORTABLE CHEST - 1 VIEW  Comparison: 07/25/2013  Findings: Cardiomediastinal silhouette is stable.  Stable NG tube position.  Atherosclerotic calcifications of thoracic aorta again noted.  There is increasing partially loculated left pleural effusion. There is streaky airspace   left upper lobe suspicious for superimposed new infiltrate.  Persistent consolidation in left lower lobe.  Small amount of streaky atelectasis or infiltrate in the right lower lobe.  IMPRESSION: There is increasing partially loculated left pleural effusion. There is streaky airspace   left upper lobe suspicious for superimposed new infiltrate.  Persistent consolidation in left lower lobe.  Small amount of streaky atelectasis or infiltrate in the right lower lobe.   Original Report Authenticated By: Natasha Mead, M.D.   Dg Chest Port 1 View  07/25/2013   *RADIOLOGY REPORT*  Clinical Data: Fever and congestion  PORTABLE CHEST - 1 VIEW  Comparison:  July 23, 2013  Findings:  Tube and catheter positions are unchanged.  No pneumothorax.  There is new effusion on the right with right base atelectasis.  Left lower lobe consolidation remains.  Heart is mildly enlarged with normal pulmonary vascularity.  No adenopathy. There is atherosclerotic change in the aorta. Bones are osteoporotic.  IMPRESSION: Persistent left lower lobe consolidation.  New effusion on the right with patchy right base atelectasis.  No pneumothorax.  No change in  cardiac silhouette.   Original Report Authenticated By: Bretta Bang, M.D.   Dg Abd 2 Views  07/26/2013   *RADIOLOGY REPORT*  Clinical Data: Follow-up small bowel obstruction.  ABDOMEN - 2 VIEW  Comparison: 07/22/2013.  Findings: There are persistent findings compatible with small bowel obstruction with dilated loops of bowel measuring up to 5 cm in the central abdomen and pelvis.  Enteric tube remains present within the stomach.  Severe scoliosis.  On the decubitus film, there is no free air.  Fluid levels are identified.  IMPRESSION: No interval change in small bowel obstruction.   Original Report Authenticated By:  Andreas Newport, M.D.  Agree. Worsening left sided airspace disease. Looks to be a mix of atelectasis and possibly loculated effusion.   ASSESSMENT / PLAN:  PULMONARY A: Chronic obstructive lung disease/ fixed asthma?  PNA (HCAP) Evolving Left pleural effusion (appears loculated) Extubated w/out difficulty. No c/o dyspnea  P:   Routine post-op pulm hygiene Cont dulera Wean FIO2 Aspiration precautions Repeat PCXR in am. May need CT chest to further eval and if volume of effusion large enough will need drainage (diagnostic/therapeutic)  CARDIOVASCULAR A: Moderate Severe aortic stenosis and cardiac arrhythmias P:  Per cardiology  RENAL A: hypokalemia  P:   Per [facility]  GASTROINTESTINAL A:   Small bowel obstruction S/p expl lap w/ lysis of adhesions on 7/28.  P:   Post-op per surg   HEMATOLOGIC A:  Anemia of chronic disease P:  Packed red blood cells for hemoglobin less than 7 g percent only  INFECTIOUS A:   Left lower lobe hospital-acquired pneumonia Bacteremia (prelim looks like staph. ID thinks PICC related) P:   Antibiotics per [facility] & ID Prob Need to have PICC removed. Might be best to go day w/out TNA and central access.   ENDOCRINE A:  Nil acute   P:   Monitor  NEUROLOGIC A:  Neurologically intact P:   Try to avoid  delirium if possible but she is at high risk   PETE BABCOCK NP   07/26/2013 3:55 PM   STAFF NOTE: I, Dr Lavinia Sharps have personally reviewed patient's available data, including medical history, events of note, physical examination and test results as part of my evaluation. I have discussed with resident/NP and other care providers such as pharmacist, RN and RRT.  In addition,  I personally evaluated patient and elicited key findings of preoperative resp examination. SAw her pre-op. SHe looked better. Considered moderate-high risk for pulmonary complocations post op. SHe is doing well so far. Will continue to followu  Rest per NP/medical resident whose note is outlined above and that I agree with   Dr. Kalman Shan, M.D., [facility].C.P Pulmonary and Critical Care Medicine Staff Physician [facility] Pager: [phone], If no answer or between  15:00h - 7:00h: call [phone]  07/26/2013 5:52 PM

## 2013-07-26 NOTE — Progress Notes (Signed)
[facility] PROGRESS NOTE  Ariel Hunt ZOX:[id number] DOB: [birth year] DOA: 07/17/2013 PCP: Daisy Floro, MD  Assessment/Plan: Principal Problem:   Small bowel obstruction Active Problems:   COLLAGENOUS COLITIS   Hypertension   CAD (coronary artery disease)   Hyperlipidemia   Anemia   Heart murmur, systolic   DNR (do not resuscitate)   Aortic stenosis, severe   Arrhythmia   NSTEMI (non-ST elevated myocardial infarction)   Ventricular tachycardia   Hypokalemia   Hyperkalemia   HCAP (healthcare-associated pneumonia)   Acute systolic CHF (congestive heart failure)   Preoperative respiratory examination  Brief narrative: 79 y.o. female with hx of mild to moderate aortic stenosis, last ECHO about a year ago, hx of CAD with cardiac stent, prior SBO felt to be secondary to adhesions, resolved with conservative treatments several times, hx of HTN, dyslipidemia, GERD, asthma, anemia of unclear etiology, presenting to the ER with increased abdominal pain and distention for 2 days. Her last BM was on 07/16/13 and it was normal. She denied hemorrhoidal bleed, bloody diarrhea, epistasis, or any other obvious source of bleeding. Her last Hb was about a year ago (May 2013) and it was 11 grams/DL, then in Oct 2013, it dropped to about 10 grams, and on 07/17/13, it was 7.6 grams per DL, with MCV 75 and normal Cr. CT Abdomen/Pelvis showed SBO with transitional point in the small bowel, query luminal narrowing. She has normal WBC and the rest of her chemistries were unremarkable. Her last colonoscopy was in 2009, when she was dx with collagenous colitis. She is on very low dose of Entecort. She also has compressive Fx and has been taking taking pain medication regularly. [name] consulted surgery and hospitalist was asked to admit her for SBO and anemia. Despite conservative measures, SBO did not resolve. Patient was a high risk surgical candidate but since she did not resolve with conservative  measures, after careful consideration and optimizing her medical care, she underwent surgery on 7/28. She developed cardiac arrhythmias (A. fib and NSVT) a few days back and was transferred to the step down unit and is on IV amiodarone with no further episodes. She also spiked fever secondary to pneumonia and possible staph bacteremia. Surgery, cardiology continue to see patient. ID has been consulted.  Assessment & plan: Patient was seen today prior to her surgery.  1. SBO:  Patient was initially treated conservatively with bowel rest, NG tube decompression, IV fluids and mobilization but failed. She is a high risk surgical candidate but after she failed conservative management, once her medical conditions were stabilized, she underwent surgery for SBO on 7/28. Management per general surgery. Critical care medicine continues to see patient. 2. Fever/possible HCAP versus aspiration pneumonia: Associated leukocytosis Blood culture sent. UA not suggestive of UTI. Started empiric IV vancomycin and Zosyn. 3. Staph bacteremia:? PICC related. Continue IV vancomycin. Infectious disease consulted and recommend removing PICC. However patient is status post surgery and will most likely be n.p.o. for a few days and will require TNA, fluids, antibiotics, blood draws, amiodarone-difficult situation. 4. Volume overload/? Acute systolic CHF: Patient's dyspnea improved after a dose of IV Lasix on 7/27. Reduce IV fluids and will need when necessary IV Lasix. 5. Hypokalemia/hyperkalemia/hypomagnesemia/hypocalcemia: Patient was severely hypokalemic on 7/25-aggressively repleted IV-hypokalemic (K.: 5.6). Change IV fluids to without potassium, reduce potassium and TNA and followup BMP this afternoon. Replete magnesium. Corrected Calcium 7.9 - replete in TNA. Replete as needed and follow BMP. 6. Anemia: Patient has a history of chronic anemia.  Her last HB in 04/2012 was 11.0, dropped to about 10.0 in 09/2012, and on 07/17/13, it  was 7.6, MCV 75. Etiology is unclear. Anemia panel ordered. Transfused 2 units of PRBCs, with satisfactory bump in HB to 9.6. HB has remained stable/satisfactory. Follow CBC. 7. Moderate - Severe Aortic stenosis: Patient has known history of moderate aortic stenosis, per 2D Echocardiogram about a year ago. Repeat 2D Echocardiogram of 07/18/13, showed normal LV cavity size, normal systolic function, and no regional wall motion abnormalities. There was severe aortic valve stenosis (however per Cardiology- patient has Moderate-severe AS). Valve area: 0.8cm^2(VTI). Valve area: 0.79cm^2 (Vmax), moderate mitral regurgitation, moderately dilated LA and RA, as well as PA peak pressure: 45mm Hg (S).  8. Arrhythmia(NSVT/SVT/Afib)/Hypotension: Overnight on 07/18/13, patient developed tachyarrhythmias, with V. Tach, SVT and A,fib with RVR. Dr Armanda Magic provided cardiology consultation, patient was managed with Amiodarone bolus, iv Lopressor and Amiodarone infusion. Troponin I bumped to 0.43. In AM of 07/19/13, patient was initially in fast A. Fib, but then reverted to SR, with HR in the 80s. Systolic BP 70-83. Bolused 250 ml NS, and after discussion with family, commenced trial of NeoSynephrine, with family accepting of the fact that if this failed, no further escalation would be attempted. Fortunately, patient responded rather dramatically, with resolution of hypotension. NeoSynephrine was discontinued on 07/20/13, without deleterious effect. Still in SR and HR is satisfactory. Patient had an episode of nonsustained polymorphic VT on 7/24 at 2:40 AM. Otherwise maintaining sinus rhythm. On amiodarone infusion. Cardiology following. Heparin infusion discontinued 7/27. 9. History of colitis:  Patient has known history of Collagenous colitis, on Entocort. This appears clinically stable at this time. On stress doses of steroids. 10. CAD/type 2 NSTEMI: Known history of CAD, S/P PCI/Stent. As described above, now had bump in CEs.  CEs have leveled off. Patient likely had demand ischemia. Heparin infusion DC'd 7/27.  11. HTN: See discussion in #4 above. Now normalized.  12. GERD: Asymptomatic. 13. LE wounds: Patient has chronic healing LLE wound, possibly associated with venous insufficiency. Regularly follows up at the wound care clinic. WOC consulted and recommendations implemented.  14. Protein-Calorie malnutrition: Clinically, patient appears severely malnourished. Will need nutritionist input in due course. TNA to be started on 7/25.  GI prophylaxis: IV Pepcid Activity: Up with assistance DVT prophylaxis: Lovenox Nutrition: N.p.o. on TNA since 7/25 Code Status: DNR. Family Communication: Discussed with daughter Ms. Romeo Apple , a son and son-in-law at bedside. Disposition Plan: Patient will remain in step down unit.   Consultants:  General surgery.   Cardiology.  Critical care medicine  Infectious disease  Procedures:  PICC line 7/25  Foley catheter  NG tube to low wall suction  Exploratory laparotomy, small bowel resection and lysis of adhesion on 7/28  Antibiotics:  IV vancomycin 7/27 >  IV Zosyn 7/27 >  HPI/Subjective: Seen before surgery. No BM or flatus. Abdominal symptoms unchanged. Denies dyspnea.  Objective: Vital signs in last 24 hours: Temp:  [97.5 F (36.4 C)-98 F (36.7 C)] 97.5 F (36.4 C) (07/28 1345) Pulse Rate:  [59-79] 62 (07/28 1630) Resp:  [14-24] 16 (07/28 1630) BP: (96-135)/(46-89) 122/76 mmHg (07/28 1415) SpO2:  [92 %-100 %] 100 % (07/28 1630) Arterial Line BP: (109-123)/(54-63) 121/60 mmHg (07/28 1330) FiO2 (%):  [100 %] 100 % (07/28 1315) Weight change:  Last BM Date: 07/17/13  Intake/Output from previous day: 07/27 0701 - 07/28 0700 In: 2778.6 [I.V.:761.1; IV Piggyback:637.5; TPN:1380] Out: 1660 [Urine:1210; Emesis/NG output:450] Total I/O In:  2387.8 [I.V.:1951.8; IV Piggyback:250; TPN:186] Out: 755 [Urine:305; Emesis/NG output:100;  Other:350]   Physical Exam: General: Pleasant and comfortable. Hard of hearing. Frail elderly female.  CHEST:   Reduced breath sounds in the bases with occasional basal crackles. No increased work of breathing. HEART:  Sounds 1 and 2 heard, normal, regular. No JVD or pedal edema. 2/6 systolic ejection murmur at apex. Telemetry: Sinus rhythm in the 60s/70s. No further arrhythmias in the last 72 hours. ABDOMEN:  Abdomen feels less distended than 7/26, soft, non-tender, no palpable organomegaly, no palpable masses. Bowel sounds present. LOWER EXTREMITIES:  No pitting edema, palpable peripheral pulses. Has dressings on LLE.  CENTRAL NERVOUS SYSTEM:  No focal neurologic deficit on gross examination. Alert and oriented.  Lab Results:  Recent Labs  07/25/13 0345 07/26/13 0430 07/26/13 1040  WBC 15.6* 17.7*  --   HGB 10.3* 8.6* 10.2*  HCT 32.9* 26.1* 30.0*  PLT 270 174  --     Recent Labs  07/25/13 0345 07/26/13 0430 07/26/13 1040  NA 138 139 139  K 4.1 2.6* 3.3*  CL 106 107  --   CO2 22 25  --   GLUCOSE 150* 150* 137*  BUN 15 22  --   CREATININE 0.74 0.78  --   CALCIUM 7.9* 7.3*  --    Recent Results (from the past 240 hour(s))  MRSA PCR SCREENING     Status: None   Collection Time    07/19/13 11:47 AM      Result Value Range Status   MRSA by PCR NEGATIVE  NEGATIVE Final   Comment:            The GeneXpert MRSA Assay (FDA     approved for NASAL specimens     only), is one component of a     comprehensive MRSA colonization     surveillance program. It is not     intended to diagnose MRSA     infection nor to guide or     monitor treatment for     MRSA infections.  CULTURE, BLOOD (ROUTINE X 2)     Status: None   Collection Time    07/25/13  8:15 AM      Result Value Range Status   Specimen Description BLOOD RIGHT ARM   Final   Special Requests BOTTLES DRAWN AEROBIC AND ANAEROBIC [facility] EACH   Final   Culture  Setup Time 07/25/2013 15:17   Final   Culture     Final    Value: GRAM POSITIVE COCCI IN CLUSTERS     Note: Gram Stain Report Called to,Read Back By and Verified With: Rita Ohara RN on 07/26/13 at 04:45 by Christie Nottingham   Report Status PENDING   Incomplete  CULTURE, BLOOD (ROUTINE X 2)     Status: None   Collection Time    07/25/13  8:36 AM      Result Value Range Status   Specimen Description BLOOD RIGHT HAND   Final   Special Requests BOTTLES DRAWN AEROBIC AND ANAEROBIC [facility] EACH   Final   Culture  Setup Time 07/25/2013 15:17   Final   Culture     Final   Value: GRAM POSITIVE COCCI IN CLUSTERS     Note: Gram Stain Report Called to,Read Back By and Verified With: Rita Ohara RN on 07/26/13 at 04:45 by Christie Nottingham   Report Status PENDING   Incomplete     Studies/Results: Dg Chest Port 1 View  07/26/2013   *  RADIOLOGY REPORT*  Clinical Data: Postop pneumonia  PORTABLE CHEST - 1 VIEW  Comparison: 07/25/2013  Findings: Cardiomediastinal silhouette is stable.  Stable NG tube position.  Atherosclerotic calcifications of thoracic aorta again noted.  There is increasing partially loculated left pleural effusion. There is streaky airspace   left upper lobe suspicious for superimposed new infiltrate.  Persistent consolidation in left lower lobe.  Small amount of streaky atelectasis or infiltrate in the right lower lobe.  IMPRESSION: There is increasing partially loculated left pleural effusion. There is streaky airspace   left upper lobe suspicious for superimposed new infiltrate.  Persistent consolidation in left lower lobe.  Small amount of streaky atelectasis or infiltrate in the right lower lobe.   Original Report Authenticated By: Natasha Mead, M.D.   Dg Chest Port 1 View  07/25/2013   *RADIOLOGY REPORT*  Clinical Data: Fever and congestion  PORTABLE CHEST - 1 VIEW  Comparison:  July 23, 2013  Findings:  Tube and catheter positions are unchanged.  No pneumothorax.  There is new effusion on the right with right base atelectasis.  Left lower lobe consolidation remains.   Heart is mildly enlarged with normal pulmonary vascularity.  No adenopathy. There is atherosclerotic change in the aorta. Bones are osteoporotic.  IMPRESSION: Persistent left lower lobe consolidation.  New effusion on the right with patchy right base atelectasis.  No pneumothorax.  No change in cardiac silhouette.   Original Report Authenticated By: Bretta Bang, M.D.   Dg Abd 2 Views  07/26/2013   *RADIOLOGY REPORT*  Clinical Data: Follow-up small bowel obstruction.  ABDOMEN - 2 VIEW  Comparison: 07/22/2013.  Findings: There are persistent findings compatible with small bowel obstruction with dilated loops of bowel measuring up to 5 cm in the central abdomen and pelvis.  Enteric tube remains present within the stomach.  Severe scoliosis.  On the decubitus film, there is no free air.  Fluid levels are identified.  IMPRESSION: No interval change in small bowel obstruction.   Original Report Authenticated By: Andreas Newport, M.D.    Medications: Scheduled Meds: . antiseptic oral rinse  15 mL Mouth Rinse QID  . aspirin  300 mg Rectal Daily  . chlorhexidine  15 mL Mouth/Throat BID  . famotidine (PEPCID) IV  20 mg Intravenous Q12H  . heparin  5,000 Units Subcutaneous Q8H  . hydrocortisone sod succinate (SOLU-CORTEF) inj  25 mg Intravenous Q12H  . insulin aspart  0-9 Units Subcutaneous Q8H  . metoprolol  2.5 mg Intravenous Q6H  . mometasone-formoterol  2 puff Inhalation BID  . piperacillin-tazobactam (ZOSYN)  IV  3.375 g Intravenous Q8H  . potassium chloride  10 mEq Intravenous Q1 Hr x 4  . sodium chloride  10-40 mL Intracatheter Q12H  . sodium chloride  3 mL Intravenous Q12H  . triamcinolone cream  1 application Topical Weekly  . vancomycin  1,000 mg Intravenous Q24H   Continuous Infusions: . 0.9 % NaCl with KCl 20 mEq / L    . amiodarone (NEXTERONE PREMIX) 360 mg/200 mL dextrose 30 mg/hr (07/26/13 1443)  . Marland KitchenTPN (CLINIMIX-E) Adult     And  . fat emulsion    . Marland KitchenTPN (CLINIMIX-E) Adult 50  mL/hr (07/26/13 1100)   And  . fat emulsion 250 mL (07/25/13 1720)   PRN Meds:.acetaminophen, albuterol, bisacodyl, diphenhydrAMINE, hydrALAZINE, LORazepam, morphine injection, morphine injection, ondansetron (ZOFRAN) IV, ondansetron (ZOFRAN) IV, ondansetron, sodium chloride    LOS: 9 days   [facility]  [facility] Pager [phone]. If 8PM-8AM, please contact  night-coverage at [email or web address], password [facility] 07/26/2013, 4:53 PM  LOS: 9 days

## 2013-07-26 NOTE — Progress Notes (Signed)
Patient ID: Ariel Hunt, female   DOB: 1923/05/27, 79 y.o.   MRN: [id number]  General Surgery - [facility] - Progress Note  HD# 9  Subjective: Patient awake and alert.  No complaints.  Denies pain.  No BM or flatus overnight.    Objective: Vital signs in last 24 hours: Temp:  [97.5 F (36.4 C)-98.9 F (37.2 C)] 97.5 F (36.4 C) (07/28 0335) Pulse Rate:  [59-81] 59 (07/28 0400) Resp:  [14-28] 16 (07/28 0400) BP: (89-120)/(37-58) 120/57 mmHg (07/28 0400) SpO2:  [92 %-99 %] 98 % (07/28 0400) Last BM Date: 07/17/13  Intake/Output from previous day: 07/27 0701 - 07/28 0700 In: 2741.1 [I.V.:761.1; IV Piggyback:600; TPN:1380] Out: 1660 [Urine:1210; Emesis/NG output:450]  Exam: Chest - clear bilaterally Cor - RRR, no murmur Abd - softer, mild distension; few BS present; no tenderness; well healed midline incision; no hernia Ext - no significant edema Neuro - grossly intact, no focal deficits  Lab Results:   Recent Labs  07/25/13 0345 07/26/13 0430  WBC 15.6* 17.7*  HGB 10.3* 8.6*  HCT 32.9* 26.1*  PLT 270 174     Recent Labs  07/25/13 0345 07/26/13 0430  NA 138 139  K 4.1 2.6*  CL 106 107  CO2 22 25  GLUCOSE 150* 150*  BUN 15 22  CREATININE 0.74 0.78  CALCIUM 7.9* 7.3*    Studies/Results: Dg Chest Port 1 View  07/25/2013   *RADIOLOGY REPORT*  Clinical Data: Fever and congestion  PORTABLE CHEST - 1 VIEW  Comparison:  July 23, 2013  Findings:  Tube and catheter positions are unchanged.  No pneumothorax.  There is new effusion on the right with right base atelectasis.  Left lower lobe consolidation remains.  Heart is mildly enlarged with normal pulmonary vascularity.  No adenopathy. There is atherosclerotic change in the aorta. Bones are osteoporotic.  IMPRESSION: Persistent left lower lobe consolidation.  New effusion on the right with patchy right base atelectasis.  No pneumothorax.  No change in cardiac silhouette.   Original Report  Authenticated By: Bretta Bang, M.D.   Dg Abd 2 Views  07/26/2013   *RADIOLOGY REPORT*  Clinical Data: Follow-up small bowel obstruction.  ABDOMEN - 2 VIEW  Comparison: 07/22/2013.  Findings: There are persistent findings compatible with small bowel obstruction with dilated loops of bowel measuring up to 5 cm in the central abdomen and pelvis.  Enteric tube remains present within the stomach.  Severe scoliosis.  On the decubitus film, there is no free air.  Fluid levels are identified.  IMPRESSION: No interval change in small bowel obstruction.   Original Report Authenticated By: Andreas Newport, M.D.    Assessment / Plan: 1.  Small bowel obstruction  Patient does not want surgery, but understands it may be necessary at this point  Film shows no changes   Continue NG tube, NPO for now  Likely going to OR today, Dr. Derrell Lolling will discuss with patient and family  Medical service monitoring WBC - possible pneumonia  Ariel Hunt, [facility] Office: [phone]  07/26/2013

## 2013-07-26 NOTE — Transfer of Care (Signed)
Immediate Anesthesia Transfer of Care Note  Patient: Ariel Hunt  Procedure(s) Performed: Procedure(s): small bowel resection (N/A)  Patient Location: PACU  Anesthesia Type:General  Level of Consciousness: awake and oriented  Airway & Oxygen Therapy: Patient Spontanous Breathing and Patient connected to face mask oxygen  Post-op Assessment: Report given to PACU RN and Post -op Vital signs reviewed and stable  Post vital signs: Reviewed and stable  Complications: No apparent anesthesia complications

## 2013-07-27 ENCOUNTER — Inpatient Hospital Stay (HOSPITAL_COMMUNITY): Payer: Medicare Other

## 2013-07-27 ENCOUNTER — Encounter (HOSPITAL_COMMUNITY): Payer: Self-pay | Admitting: General Surgery

## 2013-07-27 DIAGNOSIS — K56609 Unspecified intestinal obstruction, unspecified as to partial versus complete obstruction: Secondary | ICD-10-CM

## 2013-07-27 DIAGNOSIS — I509 Heart failure, unspecified: Secondary | ICD-10-CM

## 2013-07-27 DIAGNOSIS — I5021 Acute systolic (congestive) heart failure: Secondary | ICD-10-CM

## 2013-07-27 DIAGNOSIS — J90 Pleural effusion, not elsewhere classified: Secondary | ICD-10-CM

## 2013-07-27 DIAGNOSIS — R7881 Bacteremia: Secondary | ICD-10-CM

## 2013-07-27 DIAGNOSIS — B958 Unspecified staphylococcus as the cause of diseases classified elsewhere: Secondary | ICD-10-CM

## 2013-07-27 DIAGNOSIS — J189 Pneumonia, unspecified organism: Secondary | ICD-10-CM

## 2013-07-27 LAB — CBC
HCT: 27.3 % — ABNORMAL LOW (ref 36.0–46.0)
Hemoglobin: 8.6 g/dL — ABNORMAL LOW (ref 12.0–15.0)
MCH: 24.9 pg — ABNORMAL LOW (ref 26.0–34.0)
MCHC: 31.5 g/dL (ref 30.0–36.0)
RBC: 3.45 MIL/uL — ABNORMAL LOW (ref 3.87–5.11)

## 2013-07-27 LAB — BASIC METABOLIC PANEL
BUN: 21 mg/dL (ref 6–23)
CO2: 23 mEq/L (ref 19–32)
Chloride: 106 mEq/L (ref 96–112)
Glucose, Bld: 108 mg/dL — ABNORMAL HIGH (ref 70–99)
Potassium: 4.4 mEq/L (ref 3.5–5.1)
Sodium: 135 mEq/L (ref 135–145)

## 2013-07-27 LAB — GLUCOSE, CAPILLARY: Glucose-Capillary: 87 mg/dL (ref 70–99)

## 2013-07-27 MED ORDER — FUROSEMIDE 10 MG/ML IJ SOLN
20.0000 mg | Freq: Once | INTRAMUSCULAR | Status: AC
  Administered 2013-07-27: 20 mg via INTRAVENOUS
  Filled 2013-07-27: qty 2

## 2013-07-27 MED ORDER — FUROSEMIDE 10 MG/ML IJ SOLN
INTRAMUSCULAR | Status: AC
  Filled 2013-07-27: qty 4

## 2013-07-27 NOTE — Progress Notes (Signed)
1 Day Post-Op  Subjective: Stable and alert.Requiring minimal O2. Minimal pain.Afebrile.No tachycardia.Good urine output. Positive fluid balance.  Hemoglobin 8.6. CBC 12,500. Chemistries reasonable.  Being followed by internal medicine, pulmonary medicine, cardiology, infectious disease.    Objective: Vital signs in last 24 hours: Temp:  [97.5 F (36.4 C)-98 F (36.7 C)] 97.6 F (36.4 C) (07/29 0342) Pulse Rate:  [60-79] 66 (07/28 2300) Resp:  [15-24] 15 (07/28 2300) BP: (122-135)/(66-89) 122/76 mmHg (07/28 1415) SpO2:  [85 %-100 %] 100 % (07/28 2300) Arterial Line BP: (109-123)/(54-63) 121/60 mmHg (07/28 1330) FiO2 (%):  [100 %] 100 % (07/28 1315) Last BM Date: 07/17/13  Intake/Output from previous day: 07/28 0701 - 07/29 0700 In: 3770.1 [I.V.:2834.1; IV Piggyback:600; TPN:336] Out: 1690 [Urine:1090; Emesis/NG output:250] Intake/Output this shift: Total I/O In: 917 [I.V.:667; IV Piggyback:150; TPN:100] Out: 400 [Urine:350; Emesis/NG output:50]  General appearance: alert. Cooperative. Significantly deconditioned. Elderly. Does not appear toxic. Resp: Clear to auscultation anteriorly GI: Soft. Minimally tender. Less distended. Solid. wound okay.  Lab Results:  Results for orders placed during the hospital encounter of 07/17/13 (from the past 24 hour(s))  GLUCOSE, CAPILLARY     Status: Abnormal   Collection Time    07/26/13  8:13 AM      Result Value Range   Glucose-Capillary 113 (*) 70 - 99 mg/dL   Comment 1 Documented in Chart     Comment 2 Notify RN    POCT I-STAT 4, (NA,K, GLUC, HGB,HCT)     Status: Abnormal   Collection Time    07/26/13 10:40 AM      Result Value Range   Sodium 139  135 - 145 mEq/L   Potassium 3.3 (*) 3.5 - 5.1 mEq/L   Glucose, Bld 137 (*) 70 - 99 mg/dL   HCT 16.1 (*) 09.6 - 04.5 %   Hemoglobin 10.2 (*) 12.0 - 15.0 g/dL  TYPE AND SCREEN     Status: None   Collection Time    07/26/13 12:00 PM      Result Value Range   ABO/RH(D) A POS     Antibody Screen NEG     Sample Expiration 07/29/2013     Unit Number [id number]     Blood Component Type RED CELLS,LR     Unit division 00     Status of Unit REL FROM [facility]     Transfusion Status OK TO TRANSFUSE     Crossmatch Result Compatible     Unit Number [id number]     Blood Component Type RED CELLS,LR     Unit division 00     Status of Unit REL FROM [facility]     Transfusion Status OK TO TRANSFUSE     Crossmatch Result Compatible     Unit Number [id number]     Blood Component Type RED CELLS,LR     Unit division 00     Status of Unit ALLOCATED     Transfusion Status OK TO TRANSFUSE     Crossmatch Result Compatible     Unit Number [id number]     Blood Component Type RED CELLS,LR     Unit division 00     Status of Unit ALLOCATED     Transfusion Status OK TO TRANSFUSE     Crossmatch Result Compatible    PREPARE RBC (CROSSMATCH)     Status: None   Collection Time    07/26/13 12:00 PM      Result Value Range   Order Confirmation ORDER PROCESSED BY BLOOD  BANK    GLUCOSE, CAPILLARY     Status: Abnormal   Collection Time    07/26/13  3:23 PM      Result Value Range   Glucose-Capillary 135 (*) 70 - 99 mg/dL   Comment 1 Documented in Chart     Comment 2 Notify RN    GLUCOSE, CAPILLARY     Status: None   Collection Time    07/26/13 11:45 PM      Result Value Range   Glucose-Capillary 95  70 - 99 mg/dL   Comment 1 Notify RN    BASIC METABOLIC PANEL     Status: Abnormal   Collection Time    07/27/13  4:15 AM      Result Value Range   Sodium 135  135 - 145 mEq/L   Potassium 4.4  3.5 - 5.1 mEq/L   Chloride 106  96 - 112 mEq/L   CO2 23  19 - 32 mEq/L   Glucose, Bld 108 (*) 70 - 99 mg/dL   BUN 21  6 - 23 mg/dL   Creatinine, Ser 1.61  0.50 - 1.10 mg/dL   Calcium 7.5 (*) 8.4 - 10.5 mg/dL   GFR calc non Af Amer 63 (*) >90 mL/min   GFR calc Af Amer 74 (*) >90 mL/min  CBC     Status: Abnormal   Collection Time    07/27/13  4:15 AM      Result Value Range   WBC  12.5 (*) 4.0 - 10.5 K/uL   RBC 3.45 (*) 3.87 - 5.11 MIL/uL   Hemoglobin 8.6 (*) 12.0 - 15.0 g/dL   HCT 09.6 (*) 04.5 - 40.9 %   MCV 79.1  78.0 - 100.0 fL   MCH 24.9 (*) 26.0 - 34.0 pg   MCHC 31.5  30.0 - 36.0 g/dL   RDW 81.1 (*) 91.4 - 78.2 %   Platelets 158  150 - 400 K/uL     Studies/Results: @RISRSLT24 @  . antiseptic oral rinse  15 mL Mouth Rinse QID  . aspirin  300 mg Rectal Daily  . chlorhexidine  15 mL Mouth/Throat BID  . famotidine (PEPCID) IV  20 mg Intravenous Q12H  . heparin  5,000 Units Subcutaneous Q8H  . hydrocortisone sod succinate (SOLU-CORTEF) inj  25 mg Intravenous Q12H  . insulin aspart  0-9 Units Subcutaneous Q8H  . metoprolol  2.5 mg Intravenous Q6H  . mometasone-formoterol  2 puff Inhalation BID  . piperacillin-tazobactam (ZOSYN)  IV  3.375 g Intravenous Q8H  . sodium chloride  10-40 mL Intracatheter Q12H  . sodium chloride  3 mL Intravenous Q12H  . triamcinolone cream  1 application Topical Weekly  . vancomycin  1,000 mg Intravenous Q24H     Assessment/Plan: s/p Procedure(s): small bowel resection  POD #1. Laparotomy, lysis of adhesions,Small bowel resection Stable Continue NG suction, n.p.o.  mobilize out of bed.  Protein calorie malnutrition. Will need new IV access for TNA.  Suspect LLL pneumonia.No increased work of breathing postop. Chest x-ray this morning  Moderate to severe aortic stenosis and cardiac arrhythmias, and recent NSTEMI per cardiology  Hypokalemia, resolved  Bacteremia, probably staph, PICC line removed.      @PROBHOSP @  LOS: 10 days    Sheba Whaling M. Derrell Lolling, M.D., [facility] General and Minimally invasive Surgery Breast and Colorectal Surgery Office:   [phone] Pager:   [phone]  07/27/2013  . .prob

## 2013-07-27 NOTE — Progress Notes (Signed)
Stopped to see patient today. There were treatment team people in the room with a family member present. Doctor arrived just as I did. Will stop back later. Patient indicated she is feeling a bit better since surgery.

## 2013-07-27 NOTE — Progress Notes (Signed)
INFECTIOUS DISEASE PROGRESS NOTE  ID: Ariel Hunt is a 79 y.o. female with  Principal Problem:   Small bowel obstruction Active Problems:   COLLAGENOUS COLITIS   Hypertension   CAD (coronary artery disease)   Hyperlipidemia   Anemia   Heart murmur, systolic   DNR (do not resuscitate)   Aortic stenosis, severe   Arrhythmia   NSTEMI (non-ST elevated myocardial infarction)   Ventricular tachycardia   Hypokalemia   Hyperkalemia   HCAP (healthcare-associated pneumonia)   Acute systolic CHF (congestive heart failure)   Preoperative respiratory examination   Bacteremia due to Staphylococcus  Subjective: Without complaints, no pain. Dizziness while trying to walk. Was up in chair today. Hungry.   Abtx:  Anti-infectives   Start     Dose/Rate Route Frequency Ordered Stop   07/26/13 0932  cefOXitin (MEFOXIN) 2 g in dextrose 5 % 50 mL IVPB  Status:  Discontinued     2 g 100 mL/hr over 30 Minutes Intravenous 30 min pre-op 07/26/13 0925 07/26/13 1415   07/26/13 0900  vancomycin (VANCOCIN) IVPB 1000 mg/200 mL premix     1,000 mg 200 mL/hr over 60 Minutes Intravenous Every 24 hours 07/26/13 0827     07/25/13 1000  vancomycin (VANCOCIN) IVPB 750 mg/150 ml premix  Status:  Discontinued     750 mg 150 mL/hr over 60 Minutes Intravenous Every 24 hours 07/25/13 0836 07/26/13 0827   07/25/13 1000  piperacillin-tazobactam (ZOSYN) IVPB 3.375 g     3.375 g 12.5 mL/hr over 240 Minutes Intravenous Every 8 hours 07/25/13 0836        Medications:  Scheduled: . antiseptic oral rinse  15 mL Mouth Rinse QID  . aspirin  300 mg Rectal Daily  . chlorhexidine  15 mL Mouth/Throat BID  . famotidine (PEPCID) IV  20 mg Intravenous Q12H  . furosemide      . heparin  5,000 Units Subcutaneous Q8H  . hydrocortisone sod succinate (SOLU-CORTEF) inj  25 mg Intravenous Q12H  . insulin aspart  0-9 Units Subcutaneous Q8H  . metoprolol  2.5 mg Intravenous Q6H  . mometasone-formoterol  2 puff Inhalation BID    . piperacillin-tazobactam (ZOSYN)  IV  3.375 g Intravenous Q8H  . sodium chloride  10-40 mL Intracatheter Q12H  . sodium chloride  3 mL Intravenous Q12H  . triamcinolone cream  1 application Topical Weekly  . vancomycin  1,000 mg Intravenous Q24H    Objective: Vital signs in last 24 hours: Temp:  [97.5 F (36.4 C)-98 F (36.7 C)] 98 F (36.7 C) (07/29 0800) Pulse Rate:  [59-79] 59 (07/29 1300) Resp:  [11-22] 11 (07/29 1300) BP: (122-135)/(66-76) 122/76 mmHg (07/28 1415) SpO2:  [85 %-100 %] 98 % (07/29 1300) Arterial Line BP: (109-123)/(54-63) 121/60 mmHg (07/28 1330) FiO2 (%):  [100 %] 100 % (07/28 1315)   General appearance: alert, cooperative and no distress Resp: clear to auscultation bilaterally Cardio: regular rate and rhythm and systolic murmur: early systolic 3/6, crescendo at 2nd left intercostal space GI: normal findings: bowel sounds normal and soft, non-tender and wound is clean.  Extremities: edema UE and bruising  Lab Results  Recent Labs  07/26/13 0430 07/26/13 1040 07/27/13 0415  WBC 17.7*  --  12.5*  HGB 8.6* 10.2* 8.6*  HCT 26.1* 30.0* 27.3*  NA 139 139 135  K 2.6* 3.3* 4.4  CL 107  --  106  CO2 25  --  23  BUN 22  --  21  CREATININE  0.78  --  0.81   Liver Panel  Recent Labs  07/25/13 0345 07/26/13 0430  PROT 5.4* 4.3*  ALBUMIN 2.7* 1.9*  AST 25 19  ALT 21 16  ALKPHOS 38* 41  BILITOT 0.3 0.3   Sedimentation Rate No results found for this basename: ESRSEDRATE,  in the last 72 hours C-Reactive Protein No results found for this basename: CRP,  in the last 72 hours  Microbiology: Recent Results (from the past 240 hour(s))  MRSA PCR SCREENING     Status: None   Collection Time    07/19/13 11:47 AM      Result Value Range Status   MRSA by PCR NEGATIVE  NEGATIVE Final   Comment:            The GeneXpert MRSA Assay (FDA     approved for NASAL specimens     only), is one component of a     comprehensive MRSA colonization      surveillance program. It is not     intended to diagnose MRSA     infection nor to guide or     monitor treatment for     MRSA infections.  CULTURE, BLOOD (ROUTINE X 2)     Status: None   Collection Time    07/25/13  8:15 AM      Result Value Range Status   Specimen Description BLOOD RIGHT ARM   Final   Special Requests BOTTLES DRAWN AEROBIC AND ANAEROBIC [facility] EACH   Final   Culture  Setup Time 07/25/2013 15:17   Final   Culture     Final   Value: STAPHYLOCOCCUS AUREUS     Note: Gram Stain Report Called to,Read Back By and Verified With: Rita Ohara RN on 07/26/13 at 04:45 by Christie Nottingham   Report Status PENDING   Incomplete  CULTURE, BLOOD (ROUTINE X 2)     Status: None   Collection Time    07/25/13  8:36 AM      Result Value Range Status   Specimen Description BLOOD RIGHT HAND   Final   Special Requests BOTTLES DRAWN AEROBIC AND ANAEROBIC [facility] EACH   Final   Culture  Setup Time 07/25/2013 15:17   Final   Culture     Final   Value: STAPHYLOCOCCUS AUREUS     Note: RIFAMPIN AND GENTAMICIN SHOULD NOT BE USED AS SINGLE DRUGS FOR TREATMENT OF STAPH INFECTIONS.     Note: Gram Stain Report Called to,Read Back By and Verified With: Rita Ohara RN on 07/26/13 at 04:45 by Christie Nottingham   Report Status PENDING   Incomplete  CULTURE, BLOOD (ROUTINE X 2)     Status: None   Collection Time    07/26/13  3:59 PM      Result Value Range Status   Specimen Description BLOOD LEFT ARM   Final   Special Requests BOTTLES DRAWN AEROBIC ONLY [facility]   Final   Culture  Setup Time 07/26/2013 21:17   Final   Culture     Final   Value:        BLOOD CULTURE RECEIVED NO GROWTH TO DATE CULTURE WILL BE HELD FOR 5 DAYS BEFORE ISSUING A FINAL NEGATIVE REPORT   Report Status PENDING   Incomplete    Studies/Results: Dg Chest Port 1 View  07/27/2013   *RADIOLOGY REPORT*  Clinical Data: Loculated left pleural effusion.  PORTABLE CHEST - 1 VIEW  Comparison: 07/26/2013.  Findings: The patient is rotated to the  left on today's  exam. Enteric tube remains present.  The left upper extremity PICC has been removed.  Aeration at the left lung base has improved.  The effusion appears to be the free flowing if no thoracentesis has been performed as there is less loculation and focal opacity at the left lung base. Dense retrocardiac opacity is present compatible with atelectasis and effusion in combination.  There is a right pleural effusion layering posteriorly with associated atelectasis. Monitoring leads are projected over the chest. Probable superimposed interstitial pulmonary edema.  IMPRESSION:  1.  Improved left lung aeration with persistent dense retrocardiac opacity likely representing effusion and atelectasis in combination. 2. Shifting right pleural effusion with right basilar atelectasis. 3.  Interstitial pulmonary edema.   Original Report Authenticated By: Andreas Newport, M.D.   Dg Chest Port 1 View  07/26/2013   *RADIOLOGY REPORT*  Clinical Data: Postop pneumonia  PORTABLE CHEST - 1 VIEW  Comparison: 07/25/2013  Findings: Cardiomediastinal silhouette is stable.  Stable NG tube position.  Atherosclerotic calcifications of thoracic aorta again noted.  There is increasing partially loculated left pleural effusion. There is streaky airspace   left upper lobe suspicious for superimposed new infiltrate.  Persistent consolidation in left lower lobe.  Small amount of streaky atelectasis or infiltrate in the right lower lobe.  IMPRESSION: There is increasing partially loculated left pleural effusion. There is streaky airspace   left upper lobe suspicious for superimposed new infiltrate.  Persistent consolidation in left lower lobe.  Small amount of streaky atelectasis or infiltrate in the right lower lobe.   Original Report Authenticated By: Natasha Mead, M.D.   Dg Abd 2 Views  07/26/2013   *RADIOLOGY REPORT*  Clinical Data: Follow-up small bowel obstruction.  ABDOMEN - 2 VIEW  Comparison: 07/22/2013.  Findings: There are persistent  findings compatible with small bowel obstruction with dilated loops of bowel measuring up to 5 cm in the central abdomen and pelvis.  Enteric tube remains present within the stomach.  Severe scoliosis.  On the decubitus film, there is no free air.  Fluid levels are identified.  IMPRESSION: No interval change in small bowel obstruction.   Original Report Authenticated By: Andreas Newport, M.D.     Assessment/Plan: Staph bacteremia Loculated Pleural Effusion (L) SBO, s/p resection 7/28  Await sensi of Staph to narrow anbx Will check 2nd f/u BCx PIC out 7/28 Hopefully back in on 7/31 No change in anbx for now Watch CXR to f/u effusion, retrocardiac density Will recheck TTE (she just had 7-20), not sure she would tolerate TEE.   Total days of antibiotics: 4 (vanco/zosyn)         Johny Sax Infectious Diseases (pager) [phone] [email or web address] 07/27/2013, 1:03 PM  LOS: 10 days

## 2013-07-27 NOTE — Progress Notes (Addendum)
Subjective:  Post op. Mild discomfort overall doing well. No chest pain, no shortness of breath.  Objective:  Vital Signs in the last 24 hours: Temp:  [97.5 F (36.4 C)-97.8 F (36.6 C)] 97.6 F (36.4 C) (07/29 0342) Pulse Rate:  [60-79] 66 (07/28 2300) Resp:  [15-22] 15 (07/28 2300) BP: (122-135)/(66-76) 122/76 mmHg (07/28 1415) SpO2:  [85 %-100 %] 100 % (07/28 2300) Arterial Line BP: (109-123)/(54-63) 121/60 mmHg (07/28 1330) FiO2 (%):  [100 %] 100 % (07/28 1315)  Intake/Output from previous day: 07/28 0701 - 07/29 0700 In: 3836.8 [I.V.:2900.8; IV Piggyback:600; TPN:336] Out: 1690 [Urine:1090; Emesis/NG output:250]   Physical Exam: General: Elderly, frail, in no acute distress. Head:  Normocephalic and atraumatic. Lungs: Left lower lobe expiratory wheeze. Heart: Normal S1 and S2.  2/6 systolic murmur right upper sternal border, no rubs or gallops.  Abdomen: Soft, less distended, bowel sounds noted. Extremities: No clubbing or cyanosis. No edema. Neurologic: Alert and oriented x 3.    Lab Results:  Recent Labs  07/26/13 0430 07/26/13 1040 07/27/13 0415  WBC 17.7*  --  12.5*  HGB 8.6* 10.2* 8.6*  PLT 174  --  158    Recent Labs  07/26/13 0430 07/26/13 1040 07/27/13 0415  NA 139 139 135  K 2.6* 3.3* 4.4  CL 107  --  106  CO2 25  --  23  GLUCOSE 150* 137* 108*  BUN 22  --  21  CREATININE 0.78  --  0.81   Hepatic Function Panel  Recent Labs  07/26/13 0430  PROT 4.3*  ALBUMIN 1.9*  AST 19  ALT 16  ALKPHOS 41  BILITOT 0.3  Imaging: Dg Chest Port 1 View  07/27/2013   *RADIOLOGY REPORT*  Clinical Data: Loculated left pleural effusion.  PORTABLE CHEST - 1 VIEW  Comparison: 07/26/2013.  Findings: The patient is rotated to the left on today's exam. Enteric tube remains present.  The left upper extremity PICC has been removed.  Aeration at the left lung base has improved.  The effusion appears to be the free flowing if no thoracentesis has been performed as  there is less loculation and focal opacity at the left lung base. Dense retrocardiac opacity is present compatible with atelectasis and effusion in combination.  There is a right pleural effusion layering posteriorly with associated atelectasis. Monitoring leads are projected over the chest. Probable superimposed interstitial pulmonary edema.  IMPRESSION:  1.  Improved left lung aeration with persistent dense retrocardiac opacity likely representing effusion and atelectasis in combination. 2. Shifting right pleural effusion with right basilar atelectasis. 3.  Interstitial pulmonary edema.   Original Report Authenticated By: Andreas Newport, M.D.   Dg Chest Port 1 View  07/26/2013   *RADIOLOGY REPORT*  Clinical Data: Postop pneumonia  PORTABLE CHEST - 1 VIEW  Comparison: 07/25/2013  Findings: Cardiomediastinal silhouette is stable.  Stable NG tube position.  Atherosclerotic calcifications of thoracic aorta again noted.  There is increasing partially loculated left pleural effusion. There is streaky airspace   left upper lobe suspicious for superimposed new infiltrate.  Persistent consolidation in left lower lobe.  Small amount of streaky atelectasis or infiltrate in the right lower lobe.  IMPRESSION: There is increasing partially loculated left pleural effusion. There is streaky airspace   left upper lobe suspicious for superimposed new infiltrate.  Persistent consolidation in left lower lobe.  Small amount of streaky atelectasis or infiltrate in the right lower lobe.   Original Report Authenticated By: Natasha Mead, M.D.  Dg Abd 2 Views  07/26/2013   *RADIOLOGY REPORT*  Clinical Data: Follow-up small bowel obstruction.  ABDOMEN - 2 VIEW  Comparison: 07/22/2013.  Findings: There are persistent findings compatible with small bowel obstruction with dilated loops of bowel measuring up to 5 cm in the central abdomen and pelvis.  Enteric tube remains present within the stomach.  Severe scoliosis.  On the decubitus  film, there is no free air.  Fluid levels are identified.  IMPRESSION: No interval change in small bowel obstruction.   Original Report Authenticated By: Andreas Newport, M.D.    Telemetry: Sinus rhythm, no evidence of ventricular tachycardia or atrial fibrillation Personally viewed.   Assessment/Plan:  Principal Problem:   Small bowel obstruction Active Problems:   COLLAGENOUS COLITIS   Hypertension   CAD (coronary artery disease)   Hyperlipidemia   Anemia   Heart murmur, systolic   DNR (do not resuscitate)   Aortic stenosis, severe   Arrhythmia   NSTEMI (non-ST elevated myocardial infarction)   Ventricular tachycardia   Hypokalemia   Hyperkalemia   HCAP (healthcare-associated pneumonia)   Acute systolic CHF (congestive heart failure)   Preoperative respiratory examination   Bacteremia due to Staphylococcus   1. ventricular tachycardia  - No further recurrence, amiodarone, keep potassium greater than 4  2. atrial fibrillation  - Amiodarone, metoprolol. Will change to by mouth once able to take by mouth.  3. non-ST elevation myocardial infarction - Greater than one week post. No chest pain. Normal systolic function/EF.  4. moderate to severe aortic stenosis - Currently not posing any hemodynamic significance.  5. small bowel obstruction -Post laparotomy, small bowel resection.  6. positive blood cultures - Reviewed infectious disease note, Dr. Ninetta Lights. I agree with treating IV antibiotics regardless. I also do not think that transesophageal echocardiogram would be of much clinical value at this point. She is not a surgical candidate for valve.  SKAINS, MARK 07/27/2013, 8:02 AM

## 2013-07-27 NOTE — Progress Notes (Addendum)
PARENTERAL NUTRITION CONSULT NOTE - FOLLOW UP  Pharmacy Consult for TNA Indication: SBO, prolonged NPO status  No Known Allergies  Patient Measurements: Height: 5\' 4"  (162.6 cm) Weight: 96 lb 1.9 oz (43.6 kg) IBW/kg (Calculated) : 54.7 Usual Weight: 44 kg   Vital Signs: Temp: 98 F (36.7 C) (07/29 0800) Temp src: Oral (07/29 0800) Pulse Rate: 63 (07/29 0930) Intake/Output from previous day: 07/28 0701 - 07/29 0700 In: 3836.8 [I.V.:2900.8; IV Piggyback:600; TPN:336] Out: 1690 [Urine:1090; Emesis/NG output:250] Intake/Output from this shift:    Labs:  Recent Labs  07/25/13 0345 07/26/13 0430 07/26/13 1040 07/27/13 0415  WBC 15.6* 17.7*  --  12.5*  HGB 10.3* 8.6* 10.2* 8.6*  HCT 32.9* 26.1* 30.0* 27.3*  PLT 270 174  --  158     Recent Labs  07/25/13 0345 07/26/13 0430 07/26/13 1040 07/27/13 0415  NA 138 139 139 135  K 4.1 2.6* 3.3* 4.4  CL 106 107  --  106  CO2 22 25  --  23  GLUCOSE 150* 150* 137* 108*  BUN 15 22  --  21  CREATININE 0.74 0.78  --  0.81  CALCIUM 7.9* 7.3*  --  7.5*  MG 2.7* 2.5  --   --   PHOS 2.6 2.7  --   --   PROT 5.4* 4.3*  --   --   ALBUMIN 2.7* 1.9*  --   --   AST 25 19  --   --   ALT 21 16  --   --   ALKPHOS 38* 41  --   --   BILITOT 0.3 0.3  --   --   PREALBUMIN  --  8.0*  --   --   TRIG  --  58  --   --    Estimated Creatinine Clearance: 33.7 ml/min (by C-G formula based on Cr of 0.81).    Recent Labs  07/26/13 1523 07/26/13 2345 07/27/13 0808  GLUCAP 135* 95 127*    CBGs & Insulin requirements past 24 hours:  - CBGs < 150, required 2 units of sensitive SSI  Nutritional Goals:  - RD recs 7/25: 1300-1500 Kcal, 50-60 g protein, > 1.2 L fluid - Clinimix E5/15 at goal 51ml/hr with daily lipids will provide 60g protein, 1332kcal  Current nutrition:  - Diet: NPO  - TNA:  Clinimix E 5/15 @ 50 ml/hr (on hold for access) - mIVF: D51/2 NS @ 50 ml/hr  Assessment:  12 yof with h/o SBO (resolved with conservative  treatments) admitted 7/19 with SBO. Surgery on board, recommended conservative management. TNA started 7/25 given prolonged NPO status   Today, 7/29, TNA day# 5. Patient underwent ex lap, LOA, small bowel resection 7/28 PM. ID recommended PICC removal given staph aureus bacteremia.  TNA was NOT given on 7/28 while awaiting for new central access.    Plan for new central access THURSDAY   Labs: Electrolytes:  All wnl. Keep K>4 per cardiology given ventricular tachycardia Renal Function: stable/wnl.  Hepatic Function: stable/wnl Pre-Albumin: 13.8 (7/25), 8 (7/28) Triglycerides: wnl CBGs: controlled on Novolog sensitive SSI.  Plan:    NO central access for TNA  Plan new access Thursday (7/31)  Pharmacy will f/u if TNA is still warranted Thursday.  Geoffry Paradise, PharmD, BCPS Pager: [phone] 9:44 AM Pharmacy #: [phone]

## 2013-07-27 NOTE — Progress Notes (Signed)
[facility] PROGRESS NOTE  Ariel Hunt:[id number] DOB: Jun 12, 1923 DOA: 07/17/2013 PCP: Daisy Floro, MD  Assessment/Plan: Principal Problem:   Small bowel obstruction Active Problems:   COLLAGENOUS COLITIS   Hypertension   CAD (coronary artery disease)   Hyperlipidemia   Anemia   Heart murmur, systolic   DNR (do not resuscitate)   Aortic stenosis, severe   Arrhythmia   NSTEMI (non-ST elevated myocardial infarction)   Ventricular tachycardia   Hypokalemia   Hyperkalemia   HCAP (healthcare-associated pneumonia)   Acute systolic CHF (congestive heart failure)   Preoperative respiratory examination   Bacteremia due to Staphylococcus  Brief narrative: 79 y.o. female with hx of mild to moderate aortic stenosis, last ECHO about a year ago, hx of CAD with cardiac stent, prior SBO felt to be secondary to adhesions, resolved with conservative treatments several times, hx of HTN, dyslipidemia, GERD, asthma, anemia of unclear etiology, presenting to the ER with increased abdominal pain and distention for 2 days. Her last BM was on 07/16/13 and it was normal. She denied hemorrhoidal bleed, bloody diarrhea, epistasis, or any other obvious source of bleeding. Her last Hb was about a year ago (May 2013) and it was 11 grams/DL, then in Oct 2013, it dropped to about 10 grams, and on 07/17/13, it was 7.6 grams per DL, with MCV 75 and normal Cr. CT Abdomen/Pelvis showed SBO with transitional point in the small bowel, query luminal narrowing. She has normal WBC and the rest of her chemistries were unremarkable. Her last colonoscopy was in 2009, when she was dx with collagenous colitis. She is on very low dose of Entecort. She also has compressive Fx and has been taking taking pain medication regularly. [name] consulted surgery and hospitalist was asked to admit her for SBO and anemia. Despite conservative measures, SBO did not resolve. Patient was a high risk surgical candidate but since she did  not resolve with conservative measures, after careful consideration and optimizing her medical care, she underwent surgery on 7/28. She developed cardiac arrhythmias (A. fib and NSVT) a few days back and was transferred to the step down unit and is on IV amiodarone with no further episodes. She also spiked fever secondary to pneumonia and possible staph bacteremia. Surgery, cardiology continue to see patient. ID has been consulted.  Assessment & plan:   1. SBO:  Patient was initially treated conservatively with bowel rest, NG tube decompression, IV fluids and mobilization but failed. She is a high risk surgical candidate but after she failed conservative management, once her medical conditions were stabilized, she underwent surgery for SBO on 7/28. Management per general surgery. Critical care medicine continues to see patient. 2. Fever/possible HCAP versus aspiration pneumonia: UA not suggestive of UTI. Started empiric IV vancomycin and Zosyn. 3. Staph aureus bacteremia:? PICC related. Continue IV vancomycin. Infectious disease consulted and as per recommendations, PICC line was removed on 7/28. Surveillance blood cultures x 1 on 7/28 neg to date. Checking TTE again (not sure that she would tolerate TEE). Hopefully PICC line can be reinserted on 7/30 afternoon, if surveillance blood cultures continue to be negative.  4. Volume overload/? Acute systolic CHF: Patient's dyspnea improved after a dose of IV Lasix on 7/27. Reduced IV fluids and will need when necessary IV Lasix. 5. Hypokalemia/hyperkalemia/hypomagnesemia/hypocalcemia: Patient was severely hypokalemic on 7/25-aggressively repleted IV-hypokalemic (K.: 5.6). Change IV fluids to without potassium, reduce potassium and TNA and followup BMP this afternoon. Replete magnesium. Corrected Calcium 7.9 - replete in TNA. Replete as  needed and follow BMP. 6. Anemia: Patient has a history of chronic anemia. Her last HB in 04/2012 was 11.0, dropped to about 10.0  in 09/2012, and on 07/17/13, it was 7.6, MCV 75. Etiology is unclear. Anemia panel ordered. Transfused 2 units of PRBCs, with satisfactory bump in HB to 9.6. HB has remained stable/satisfactory. Follow CBC and transfuse only if hemoglobin less than 7 g per DL. 7. Moderate - Severe Aortic stenosis: Patient has known history of moderate aortic stenosis, per 2D Echocardiogram about a year ago. Repeat 2D Echocardiogram of 07/18/13, showed normal LV cavity size, normal systolic function, and no regional wall motion abnormalities. There was severe aortic valve stenosis (however per Cardiology- patient has Moderate-severe AS). Valve area: 0.8cm^2(VTI). Valve area: 0.79cm^2 (Vmax), moderate mitral regurgitation, moderately dilated LA and RA, as well as PA peak pressure: 45mm Hg (S).  8. Arrhythmia(NSVT/SVT/Afib)/Hypotension: Overnight on 07/18/13, patient developed tachyarrhythmias, with V. Tach, SVT and A,fib with RVR. Dr Armanda Magic provided cardiology consultation, patient was managed with Amiodarone bolus, iv Lopressor and Amiodarone infusion. Troponin I bumped to 0.43. In AM of 07/19/13, patient was initially in fast A. Fib, but then reverted to SR, with HR in the 80s. Systolic BP 70-83. Bolused 250 ml NS, and after discussion with family, commenced trial of NeoSynephrine, with family accepting of the fact that if this failed, no further escalation would be attempted. Fortunately, patient responded rather dramatically, with resolution of hypotension. NeoSynephrine was discontinued on 07/20/13, without deleterious effect. Still in SR and HR is satisfactory. Patient had an episode of nonsustained polymorphic VT on 7/24 at 2:40 AM. Otherwise maintaining sinus rhythm. On amiodarone infusion. Cardiology following. Heparin infusion discontinued 7/27. 9. History of colitis:  Patient has known history of Collagenous colitis, on Entocort. This appears clinically stable at this time. On stress doses of steroids. 10. CAD/type 2  NSTEMI: Known history of CAD, S/P PCI/Stent. As described above, now had bump in CEs. CEs have leveled off. Patient likely had demand ischemia. Heparin infusion DC'd 7/27. Cardiology following. 11. HTN: See discussion in #4 above. Now normalized.  12. GERD: Asymptomatic. On IV Pepcid. 13. LE wounds: Patient has chronic healing LLE wound, possibly associated with venous insufficiency. Regularly follows up at the wound care clinic. WOC consulted and recommendations implemented.  14. Protein-Calorie malnutrition: Clinically, patient appears severely malnourished. Will need nutritionist input in due course. TNA to be started on 7/25.  GI prophylaxis: IV Pepcid Activity: Up with assistance DVT prophylaxis: Lovenox Nutrition: N.p.o. Code Status: Partial Family Communication: None today at bedside. Disposition Plan: Patient will remain in step down unit.   Consultants:  General surgery.   Cardiology.  Critical care medicine  Infectious disease  Procedures:  PICC line 7/25-7/28  Foley catheter  NG tube to low wall suction  Exploratory laparotomy, small bowel resection and lysis of adhesion on 7/28  Antibiotics:  IV vancomycin 7/27 >  IV Zosyn 7/27 >  HPI/Subjective:  No BM or flatus. Denies abdominal pain or dyspnea. Indicates that she had a comfortable night and slept well.  Objective: Vital signs in last 24 hours: Temp:  [97.6 F (36.4 C)-98.3 F (36.8 C)] 98.3 F (36.8 C) (07/29 1200) Pulse Rate:  [59-66] 59 (07/29 1430) Resp:  [9-22] 13 (07/29 1430) SpO2:  [85 %-100 %] 99 % (07/29 1430) Weight:  [51.1 kg (112 lb 10.5 oz)] 51.1 kg (112 lb 10.5 oz) (07/29 1430) Weight change:  Last BM Date: 07/17/13  Intake/Output from previous day: 07/28 0701 -  07/29 0700 In: 3903.5 [I.V.:2967.5; IV Piggyback:600; TPN:336] Out: 1690 [Urine:1090; Emesis/NG output:250] Total I/O In: 796.9 [I.V.:466.9; NG/GT:30; IV Piggyback:300] Out: 255 [Urine:255]   Physical  Exam: General: Pleasant and comfortable. Hard of hearing. Frail elderly female.  CHEST:   Reduced breath sounds in the bases. No increased work of breathing. HEART:  Sounds 1 and 2 heard, normal, regular. No JVD or pedal edema. 2/6 systolic ejection murmur at apex. Telemetry: Sinus rhythm in the 60s/70s. No further arrhythmias. ABDOMEN:  Abdomen not distended, soft, non-tender, no palpable organomegaly, no palpable masses. Few bowel sounds present. LOWER EXTREMITIES:  No pitting edema, palpable peripheral pulses. Has dressings on LLE.  CENTRAL NERVOUS SYSTEM:  No focal neurologic deficit on gross examination. Alert and oriented.  Lab Results:  Recent Labs  07/26/13 0430 07/26/13 1040 07/27/13 0415  WBC 17.7*  --  12.5*  HGB 8.6* 10.2* 8.6*  HCT 26.1* 30.0* 27.3*  PLT 174  --  158    Recent Labs  07/26/13 0430 07/26/13 1040 07/27/13 0415  NA 139 139 135  K 2.6* 3.3* 4.4  CL 107  --  106  CO2 25  --  23  GLUCOSE 150* 137* 108*  BUN 22  --  21  CREATININE 0.78  --  0.81  CALCIUM 7.3*  --  7.5*   Recent Results (from the past 240 hour(s))  MRSA PCR SCREENING     Status: None   Collection Time    07/19/13 11:47 AM      Result Value Range Status   MRSA by PCR NEGATIVE  NEGATIVE Final   Comment:            The GeneXpert MRSA Assay (FDA     approved for NASAL specimens     only), is one component of a     comprehensive MRSA colonization     surveillance program. It is not     intended to diagnose MRSA     infection nor to guide or     monitor treatment for     MRSA infections.  CULTURE, BLOOD (ROUTINE X 2)     Status: None   Collection Time    07/25/13  8:15 AM      Result Value Range Status   Specimen Description BLOOD RIGHT ARM   Final   Special Requests BOTTLES DRAWN AEROBIC AND ANAEROBIC [facility] EACH   Final   Culture  Setup Time 07/25/2013 15:17   Final   Culture     Final   Value: STAPHYLOCOCCUS AUREUS     Note: Gram Stain Report Called to,Read Back By and  Verified With: Rita Ohara RN on 07/26/13 at 04:45 by Christie Nottingham   Report Status PENDING   Incomplete  CULTURE, BLOOD (ROUTINE X 2)     Status: None   Collection Time    07/25/13  8:36 AM      Result Value Range Status   Specimen Description BLOOD RIGHT HAND   Final   Special Requests BOTTLES DRAWN AEROBIC AND ANAEROBIC [facility] EACH   Final   Culture  Setup Time 07/25/2013 15:17   Final   Culture     Final   Value: STAPHYLOCOCCUS AUREUS     Note: RIFAMPIN AND GENTAMICIN SHOULD NOT BE USED AS SINGLE DRUGS FOR TREATMENT OF STAPH INFECTIONS.     Note: Gram Stain Report Called to,Read Back By and Verified With: Rita Ohara RN on 07/26/13 at 04:45 by Christie Nottingham   Report Status  PENDING   Incomplete  CULTURE, BLOOD (ROUTINE X 2)     Status: None   Collection Time    07/26/13  3:59 PM      Result Value Range Status   Specimen Description BLOOD LEFT ARM   Final   Special Requests BOTTLES DRAWN AEROBIC ONLY [facility]   Final   Culture  Setup Time 07/26/2013 21:17   Final   Culture     Final   Value:        BLOOD CULTURE RECEIVED NO GROWTH TO DATE CULTURE WILL BE HELD FOR 5 DAYS BEFORE ISSUING A FINAL NEGATIVE REPORT   Report Status PENDING   Incomplete     Studies/Results: Dg Chest Port 1 View  07/27/2013   *RADIOLOGY REPORT*  Clinical Data: Loculated left pleural effusion.  PORTABLE CHEST - 1 VIEW  Comparison: 07/26/2013.  Findings: The patient is rotated to the left on today's exam. Enteric tube remains present.  The left upper extremity PICC has been removed.  Aeration at the left lung base has improved.  The effusion appears to be the free flowing if no thoracentesis has been performed as there is less loculation and focal opacity at the left lung base. Dense retrocardiac opacity is present compatible with atelectasis and effusion in combination.  There is a right pleural effusion layering posteriorly with associated atelectasis. Monitoring leads are projected over the chest. Probable superimposed  interstitial pulmonary edema.  IMPRESSION:  1.  Improved left lung aeration with persistent dense retrocardiac opacity likely representing effusion and atelectasis in combination. 2. Shifting right pleural effusion with right basilar atelectasis. 3.  Interstitial pulmonary edema.   Original Report Authenticated By: Andreas Newport, M.D.   Dg Chest Port 1 View  07/26/2013   *RADIOLOGY REPORT*  Clinical Data: Postop pneumonia  PORTABLE CHEST - 1 VIEW  Comparison: 07/25/2013  Findings: Cardiomediastinal silhouette is stable.  Stable NG tube position.  Atherosclerotic calcifications of thoracic aorta again noted.  There is increasing partially loculated left pleural effusion. There is streaky airspace   left upper lobe suspicious for superimposed new infiltrate.  Persistent consolidation in left lower lobe.  Small amount of streaky atelectasis or infiltrate in the right lower lobe.  IMPRESSION: There is increasing partially loculated left pleural effusion. There is streaky airspace   left upper lobe suspicious for superimposed new infiltrate.  Persistent consolidation in left lower lobe.  Small amount of streaky atelectasis or infiltrate in the right lower lobe.   Original Report Authenticated By: Natasha Mead, M.D.   Dg Abd 2 Views  07/26/2013   *RADIOLOGY REPORT*  Clinical Data: Follow-up small bowel obstruction.  ABDOMEN - 2 VIEW  Comparison: 07/22/2013.  Findings: There are persistent findings compatible with small bowel obstruction with dilated loops of bowel measuring up to 5 cm in the central abdomen and pelvis.  Enteric tube remains present within the stomach.  Severe scoliosis.  On the decubitus film, there is no free air.  Fluid levels are identified.  IMPRESSION: No interval change in small bowel obstruction.   Original Report Authenticated By: Andreas Newport, M.D.    Medications: Scheduled Meds: . antiseptic oral rinse  15 mL Mouth Rinse QID  . aspirin  300 mg Rectal Daily  . chlorhexidine  15 mL  Mouth/Throat BID  . famotidine (PEPCID) IV  20 mg Intravenous Q12H  . furosemide      . heparin  5,000 Units Subcutaneous Q8H  . hydrocortisone sod succinate (SOLU-CORTEF) inj  25 mg Intravenous Q12H  .  insulin aspart  0-9 Units Subcutaneous Q8H  . metoprolol  2.5 mg Intravenous Q6H  . mometasone-formoterol  2 puff Inhalation BID  . piperacillin-tazobactam (ZOSYN)  IV  3.375 g Intravenous Q8H  . sodium chloride  10-40 mL Intracatheter Q12H  . sodium chloride  3 mL Intravenous Q12H  . triamcinolone cream  1 application Topical Weekly  . vancomycin  1,000 mg Intravenous Q24H   Continuous Infusions: . amiodarone (NEXTERONE PREMIX) 360 mg/200 mL dextrose 30 mg/hr (07/27/13 1400)  . dextrose 5 % and 0.45% NaCl 50 mL/hr at 07/26/13 2318   PRN Meds:.acetaminophen, albuterol, bisacodyl, diphenhydrAMINE, hydrALAZINE, LORazepam, morphine injection, morphine injection, ondansetron (ZOFRAN) IV, ondansetron (ZOFRAN) IV, ondansetron, sodium chloride    LOS: 10 days   [facility]  [facility] Pager [phone]. If 8PM-8AM, please contact night-coverage at [email or web address], password [facility] 07/27/2013, 3:39 PM  LOS: 10 days

## 2013-07-27 NOTE — Progress Notes (Signed)
CARE MANAGEMENT NOTE 07/27/2013  Patient:  Ariel Hunt, Ariel Hunt   Account Number:  [id number]  Date Initiated:  07/18/2013  Documentation initiated by:  [facility]  Subjective/Objective Assessment:   79 year old female admitted with SBO.     Action/Plan:   From home alone.   Anticipated DC Date:  07/30/2013   Anticipated DC Plan:  HOME/SELF CARE      DC Planning Services  CM consult      Choice offered to / List presented to:             Status of service:  In process, will continue to follow Medicare Important Message given?  NA - LOS <3 / Initial given by admissions (If response is "NO", the following Medicare IM given date fields will be blank) Date Medicare IM given:   Date Additional Medicare IM given:    Discharge Disposition:    Per UR Regulation:  Reviewed for med. necessity/level of care/duration of stay  If discussed at Long Length of Stay Meetings, dates discussed:   07/27/2013    Comments:  [id number] [name] RN, BSN, CCN: [phone] Case management. Chart reviewed for discharge planning and present needs. Discharge needs: none present at time of review.  Patient had small bowel resection on [id number], episode of dyspnea , tolerated  procedure. Next chart review due:  [id number]     [id number]/Margrette Wynia [name] RN,BSN,CCM: Case management [phone] Chart reviewed and updated. Remains on iv aminodarone, sbo persist.  Next chart review due on [id number]. Needs for discharge at time of review:  None

## 2013-07-27 NOTE — Progress Notes (Signed)
Echo Lab  2D Echocardiogram completed.  Janee Ureste L Daryn Pisani, RDCS 07/27/2013 2:14 PM

## 2013-07-27 NOTE — Progress Notes (Signed)
PULMONARY  / CRITICAL CARE MEDICINE  Name: Ariel Hunt MRN: [id number] DOB: [birth year]    ADMISSION DATE:  07/17/2013 CONSULTATION DATE:  07/25/13  REFERRING MD :  Dr Marcellus Scott PRIMARY SERVICE: [facility]  CHIEF COMPLAINT:  Pre-operative Pulmonary Evaluation  HPI:  79 year old functional female with history of asthma but no COPD and as stated DO NOT RESUSCITATE but with reported moderate-severe aortic  stenosis, coronary artery disease, metabolic syndrome, anemia of chronic disease, cachexia Body mass index is 16.49 kg/(m^2). and normal renal function. She was admitted on 07/17/2013 with a small bowel obstruction that is being conservatively managed medically. Course complicated by hospital-acquired pneumonia for which he is on antibiotics and also tachyarrhythmias with V. tach, SVT and A. fib on 07/18/2013 requiring transient pressors through July 22nd 2014. 07/25/2013 she has had no clinical improvement in her small bowel obstruction and surgery is being entertained. Pulmonary critical care has been consulted for preoperative pulmonary evaluation.   SIGNIFICANT EVENTS / STUDIES:  07/17/2013: Admission with small bowel obstruction  LINES / TUBES: None  CULTURES: BCX2 7/27: GPC in clusters>>> Results for orders placed during the hospital encounter of 07/17/13  MRSA PCR SCREENING     Status: None   Collection Time    07/19/13 11:47 AM      Result Value Range Status   MRSA by PCR NEGATIVE  NEGATIVE Final   Comment:            The GeneXpert MRSA Assay (FDA     approved for NASAL specimens     only), is one component of a     comprehensive MRSA colonization     surveillance program. It is not     intended to diagnose MRSA     infection nor to guide or     monitor treatment for     MRSA infections.  CULTURE, BLOOD (ROUTINE X 2)     Status: None   Collection Time    07/25/13  8:15 AM      Result Value Range Status   Specimen Description BLOOD RIGHT ARM   Final    Special Requests BOTTLES DRAWN AEROBIC AND ANAEROBIC [facility] EACH   Final   Culture  Setup Time 07/25/2013 15:17   Final   Culture     Final   Value: STAPHYLOCOCCUS AUREUS     Note: Gram Stain Report Called to,Read Back By and Verified With: Rita Ohara RN on 07/26/13 at 04:45 by Christie Nottingham   Report Status PENDING   Incomplete  CULTURE, BLOOD (ROUTINE X 2)     Status: None   Collection Time    07/25/13  8:36 AM      Result Value Range Status   Specimen Description BLOOD RIGHT HAND   Final   Special Requests BOTTLES DRAWN AEROBIC AND ANAEROBIC [facility] EACH   Final   Culture  Setup Time 07/25/2013 15:17   Final   Culture     Final   Value: STAPHYLOCOCCUS AUREUS     Note: RIFAMPIN AND GENTAMICIN SHOULD NOT BE USED AS SINGLE DRUGS FOR TREATMENT OF STAPH INFECTIONS.     Note: Gram Stain Report Called to,Read Back By and Verified With: Rita Ohara RN on 07/26/13 at 04:45 by Christie Nottingham   Report Status PENDING   Incomplete  CULTURE, BLOOD (ROUTINE X 2)     Status: None   Collection Time    07/26/13  3:59 PM      Result Value Range Status  Specimen Description BLOOD LEFT ARM   Final   Special Requests BOTTLES DRAWN AEROBIC ONLY [facility]   Final   Culture  Setup Time 07/26/2013 21:17   Final   Culture     Final   Value:        BLOOD CULTURE RECEIVED NO GROWTH TO DATE CULTURE WILL BE HELD FOR 5 DAYS BEFORE ISSUING A FINAL NEGATIVE REPORT   Report Status PENDING   Incomplete     ANTIBIOTIC Anti-infectives   Start     Dose/Rate Route Frequency Ordered Stop   07/26/13 0932  cefOXitin (MEFOXIN) 2 g in dextrose 5 % 50 mL IVPB  Status:  Discontinued     2 g 100 mL/hr over 30 Minutes Intravenous 30 min pre-op 07/26/13 0925 07/26/13 1415   07/26/13 0900  vancomycin (VANCOCIN) IVPB 1000 mg/200 mL premix     1,000 mg 200 mL/hr over 60 Minutes Intravenous Every 24 hours 07/26/13 0827     07/25/13 1000  vancomycin (VANCOCIN) IVPB 750 mg/150 ml premix  Status:  Discontinued     750 mg 150 mL/hr over 60 Minutes  Intravenous Every 24 hours 07/25/13 0836 07/26/13 0827   07/25/13 1000  piperacillin-tazobactam (ZOSYN) IVPB 3.375 g     3.375 g 12.5 mL/hr over 240 Minutes Intravenous Every 8 hours 07/25/13 0836         Subjective    VITAL SIGNS: Temp:  [97.5 F (36.4 C)-98 F (36.7 C)] 98 F (36.7 C) (07/29 0800) Pulse Rate:  [59-79] 64 (07/29 1030) Resp:  [11-22] 16 (07/29 1030) BP: (122-135)/(66-76) 122/76 mmHg (07/28 1415) SpO2:  [85 %-100 %] 99 % (07/29 1030) Arterial Line BP: (109-123)/(54-63) 121/60 mmHg (07/28 1330) FiO2 (%):  [100 %] 100 % (07/28 1315) HEMODYNAMICS:   VENTILATOR SETTINGS: Vent Mode:  [-] PRVC FiO2 (%):  [100 %] 100 % Set Rate:  [15 bmp] 15 bmp Vt Set:  [500 mL] 500 mL PEEP:  [5 cmH20] 5 cmH20 INTAKE / OUTPUT: Intake/Output     07/28 0701 - 07/29 0700 07/29 0701 - 07/30 0700   I.V. (mL/kg) 2967.5 (68.1) 200.1 (4.6)   NG/GT  30   IV Piggyback 600 200   TPN 336    Total Intake(mL/kg) 3903.5 (89.5) 430.1 (9.9)   Urine (mL/kg/hr) 1090 (1) 175 (1.1)   Emesis/NG output 250 (0.2)    Other 350 (0.3)    Total Output 1690 175   Net +2213.5 +255.1          PHYSICAL EXAMINATION: General:  Frail elderly femalesitting in chair. Looking better post op than pre-op Neuro:  Hard of hearing but alert and oriented x3. CAM-ICU negative for delirium. Moves all fours HEENT:  Hard of hearing. Neck is supple. No neck nodes. NG tube present Cardiovascular:  Normal sinus rhythm. Blood pressure soft. Lungs:  Exp wheeze.  Abdomen:  Soft but distended. Mid abd dressing CD&I Musculoskeletal:  No cyanosis no clubbing no edema Skin:  Chronic bruises  LABS: PULMONARY-+ No results found for this basename: PHART, PCO2, PCO2ART, PO2, PO2ART, HCO3, TCO2, O2SAT,  in the last 168 hours  CBC  Recent Labs Lab 07/25/13 0345 07/26/13 0430 07/26/13 1040 07/27/13 0415  HGB 10.3* 8.6* 10.2* 8.6*  HCT 32.9* 26.1* 30.0* 27.3*  WBC 15.6* 17.7*  --  12.5*  PLT 270 174  --  158     COAGULATION No results found for this basename: INR,  in the last 168 hours  CARDIAC   No results found  for this basename: TROPONINI,  in the last 168 hours  Recent Labs Lab 07/25/13 0345  PROBNP 7166.0*     CHEMISTRY  Recent Labs Lab 07/22/13 1405 07/23/13 0400  07/24/13 0530 07/24/13 1200 07/25/13 0345 07/26/13 0430 07/26/13 1040 07/27/13 0415  NA  --  144  < > 139 136 138 139 139 135  K 3.2* 2.5*  < > 5.6* 3.3* 4.1 2.6* 3.3* 4.4  CL  --  107  < > 106 104 106 107  --  106  CO2  --  30  < > 27 26 22 25   --  23  GLUCOSE  --  106*  < > 129* 134* 150* 150* 137* 108*  BUN  --  9  < > 11 11 15 22   --  21  CREATININE  --  0.68  < > 0.67 0.69 0.74 0.78  --  0.81  CALCIUM  --  8.0*  < > 6.8* 8.0* 7.9* 7.3*  --  7.5*  MG 2.2 2.1  --  1.4*  --  2.7* 2.5  --   --   PHOS  --  2.7  --  2.0*  --  2.6 2.7  --   --   < > = values in this interval not displayed.  LIVER  Recent Labs Lab 07/22/13 0335 07/23/13 0400 07/24/13 0530 07/25/13 0345 07/26/13 0430  AST 19 19 54* 25 19  ALT 13 15 21 21 16   ALKPHOS 31* 34* 31* 38* 41  BILITOT 0.2* 0.2* 0.2* 0.3 0.3  PROT 4.9* 5.0* 5.2* 5.4* 4.3*  ALBUMIN 2.4* 2.5* 2.6* 2.7* 1.9*     INFECTIOUS No results found for this basename: LATICACIDVEN, PROCALCITON,  in the last 168 hours   ENDOCRINE CBG (last 3)   Recent Labs  07/26/13 1523 07/26/13 2345 07/27/13 0808  GLUCAP 135* 95 127*    IMAGING x48h  Dg Chest Port 1 View  07/27/2013   *RADIOLOGY REPORT*  Clinical Data: Loculated left pleural effusion.  PORTABLE CHEST - 1 VIEW  Comparison: 07/26/2013.  Findings: The patient is rotated to the left on today's exam. Enteric tube remains present.  The left upper extremity PICC has been removed.  Aeration at the left lung base has improved.  The effusion appears to be the free flowing if no thoracentesis has been performed as there is less loculation and focal opacity at the left lung base. Dense retrocardiac opacity is  present compatible with atelectasis and effusion in combination.  There is a right pleural effusion layering posteriorly with associated atelectasis. Monitoring leads are projected over the chest. Probable superimposed interstitial pulmonary edema.  IMPRESSION:  1.  Improved left lung aeration with persistent dense retrocardiac opacity likely representing effusion and atelectasis in combination. 2. Shifting right pleural effusion with right basilar atelectasis. 3.  Interstitial pulmonary edema.   Original Report Authenticated By: Andreas Newport, M.D.   Dg Chest Port 1 View  07/26/2013   *RADIOLOGY REPORT*  Clinical Data: Postop pneumonia  PORTABLE CHEST - 1 VIEW  Comparison: 07/25/2013  Findings: Cardiomediastinal silhouette is stable.  Stable NG tube position.  Atherosclerotic calcifications of thoracic aorta again noted.  There is increasing partially loculated left pleural effusion. There is streaky airspace   left upper lobe suspicious for superimposed new infiltrate.  Persistent consolidation in left lower lobe.  Small amount of streaky atelectasis or infiltrate in the right lower lobe.  IMPRESSION: There is increasing partially loculated left pleural effusion. There is streaky  airspace   left upper lobe suspicious for superimposed new infiltrate.  Persistent consolidation in left lower lobe.  Small amount of streaky atelectasis or infiltrate in the right lower lobe.   Original Report Authenticated By: Natasha Mead, M.D.   Dg Abd 2 Views  07/26/2013   *RADIOLOGY REPORT*  Clinical Data: Follow-up small bowel obstruction.  ABDOMEN - 2 VIEW  Comparison: 07/22/2013.  Findings: There are persistent findings compatible with small bowel obstruction with dilated loops of bowel measuring up to 5 cm in the central abdomen and pelvis.  Enteric tube remains present within the stomach.  Severe scoliosis.  On the decubitus film, there is no free air.  Fluid levels are identified.  IMPRESSION: No interval change in small  bowel obstruction.   Original Report Authenticated By: Andreas Newport, M.D.  Agree. Worsening left sided airspace disease. Looks to be a mix of atelectasis and possibly loculated effusion.   ASSESSMENT / PLAN:  PULMONARY A: Chronic obstructive lung disease/ fixed asthma?  PNA (HCAP) Evolving Left pleural effusion (appears loculated) Extubated w/out difficulty. No c/o dyspnea   07/27/13: CXR better. BNP suggests diastolic dysfunction  P:  \ Lasix 20mg  IV and would consider diuresis based on bnp Routine post-op pulm hygiene Cont dulera Wean FIO2 Aspiration precautions Repeat PCXR in am. May need CT chest to further eval and if volume of effusion large enough will need drainage (diagnostic/therapeutic)  CARDIOVASCULAR A: Moderate Severe aortic stenosis and cardiac arrhythmias P:  Per cardiology  RENAL A: Normal P:   Per [facility]  GASTROINTESTINAL A:   Small bowel obstruction S/p expl lap w/ lysis of adhesions on 7/28.  P:   Post-op per surg   HEMATOLOGIC A:  Anemia of chronic disease P:  Packed red blood cells for hemoglobin less than 7 g percent only  INFECTIOUS A:   Left lower lobe hospital-acquired pneumonia Bacteremia (prelim looks like staph. ID thinks PICC related) S/p PICC line dc. No central line for now  P:   Antibiotics per [facility] & ID Prob Need to have PICC removed. Might be best to go day w/out TNA and central access.   ENDOCRINE A:  Nil acute   P:   Monitor  NEUROLOGIC A:  Neurologically intact P:   Try to avoid delirium if possible but she is at high risk    GLOBAL 07/27/13: Son and patient updted. PCCM will sign off   Dr. Kalman Shan, M.D., [facility].C.P Pulmonary and Critical Care Medicine Staff Physician [facility] [place] [facility] Pager: [phone], If no answer or between  15:00h - 7:00h: call [phone]  07/27/2013 10:37 AM

## 2013-07-28 LAB — GLUCOSE, CAPILLARY
Glucose-Capillary: 83 mg/dL (ref 70–99)
Glucose-Capillary: 99 mg/dL (ref 70–99)

## 2013-07-28 LAB — CULTURE, BLOOD (ROUTINE X 2)

## 2013-07-28 LAB — CBC
HCT: 26.4 % — ABNORMAL LOW (ref 36.0–46.0)
MCH: 25.4 pg — ABNORMAL LOW (ref 26.0–34.0)
MCV: 77.9 fL — ABNORMAL LOW (ref 78.0–100.0)
Platelets: 206 10*3/uL (ref 150–400)
RBC: 3.39 MIL/uL — ABNORMAL LOW (ref 3.87–5.11)
WBC: 9.5 10*3/uL (ref 4.0–10.5)

## 2013-07-28 LAB — BASIC METABOLIC PANEL
BUN: 18 mg/dL (ref 6–23)
CO2: 23 mEq/L (ref 19–32)
Calcium: 7.5 mg/dL — ABNORMAL LOW (ref 8.4–10.5)
Chloride: 103 mEq/L (ref 96–112)
Creatinine, Ser: 0.8 mg/dL (ref 0.50–1.10)

## 2013-07-28 LAB — MAGNESIUM: Magnesium: 2.1 mg/dL (ref 1.5–2.5)

## 2013-07-28 MED ORDER — HYDROCORTISONE SOD SUCCINATE 100 MG IJ SOLR
12.5000 mg | Freq: Two times a day (BID) | INTRAMUSCULAR | Status: DC
  Administered 2013-07-28 – 2013-07-29 (×2): 12.5 mg via INTRAVENOUS
  Filled 2013-07-28 (×4): qty 0.25

## 2013-07-28 MED ORDER — CEFAZOLIN SODIUM-DEXTROSE 2-3 GM-% IV SOLR
2.0000 g | Freq: Three times a day (TID) | INTRAVENOUS | Status: DC
  Administered 2013-07-28 – 2013-08-02 (×14): 2 g via INTRAVENOUS
  Filled 2013-07-28 (×17): qty 50

## 2013-07-28 NOTE — Progress Notes (Signed)
[facility] PROGRESS NOTE  Ariel Hunt WUJ:[id number] DOB: [birth year] DOA: 07/17/2013 PCP: Daisy Floro, MD  Assessment/Plan: Principal Problem:   Small bowel obstruction Active Problems:   COLLAGENOUS COLITIS   Hypertension   CAD (coronary artery disease)   Hyperlipidemia   Anemia   Heart murmur, systolic   DNR (do not resuscitate)   Aortic stenosis, severe   Arrhythmia   NSTEMI (non-ST elevated myocardial infarction)   Ventricular tachycardia   Hypokalemia   Hyperkalemia   HCAP (healthcare-associated pneumonia)   Acute systolic CHF (congestive heart failure)   Preoperative respiratory examination   Bacteremia due to Staphylococcus  Brief narrative: 79 y.o. female with hx of mild to moderate aortic stenosis, last ECHO about a year ago, hx of CAD with cardiac stent, prior SBO felt to be secondary to adhesions, resolved with conservative treatments several times, hx of HTN, dyslipidemia, GERD, asthma, anemia of unclear etiology, presenting to the ER with increased abdominal pain and distention for 2 days. Her last BM was on 07/16/13 and it was normal. She denied hemorrhoidal bleed, bloody diarrhea, epistasis, or any other obvious source of bleeding. Her last Hb was about a year ago (May 2013) and it was 11 grams/DL, then in Oct 2013, it dropped to about 10 grams, and on 07/17/13, it was 7.6 grams per DL, with MCV 75 and normal Cr. CT Abdomen/Pelvis showed SBO with transitional point in the small bowel, query luminal narrowing. She has normal WBC and the rest of her chemistries were unremarkable. Her last colonoscopy was in 2009, when she was dx with collagenous colitis. She is on very low dose of Entecort. She also has compressive Fx and has been taking taking pain medication regularly. [name] consulted surgery and hospitalist was asked to admit her for SBO and anemia. Despite conservative measures, SBO did not resolve. Patient was a high risk surgical candidate but since she did  not resolve with conservative measures, after careful consideration and optimizing her medical care, she underwent surgery on 7/28. She developed cardiac arrhythmias (A. fib and NSVT) a few days back and was transferred to the step down unit and is on IV amiodarone with no further episodes. She also spiked fever secondary to pneumonia and possible staph bacteremia. Surgery, cardiology continue to see patient. ID has been consulted.  Assessment & plan:   1. SBO:  Patient was initially treated conservatively with bowel rest, NG tube decompression, IV fluids and mobilization but failed. Once her medical conditions were stabilized, she underwent Ex- lap, lysis of adhesions and small bowel resection on 7/28. -Management per general surgery.        -NG tube, output minimal  2. Fever/possible HCAP versus aspiration pneumonia: UA not suggestive of UTI. Started empiric IV vancomycin and Zosyn.  3. MSSA bacteremia:? PICC related. Continue IV vancomycin. Infectious disease consulted and as per recommendations, PICC line was removed on 7/28. Surveillance blood cultures x 2 on 7/29 neg to date.       -replace PICC 7/31 if cultures remain negative   4. Volume overload/? Acute systolic CHF: Patient's dyspnea improved after a dose of IV Lasix on 7/27.  -stop IVF, on TNA now -ECHo with normal Ef and mod to severe AS   5.Hypokalemia/hyperkalemia/hypomagnesemia/hypocalcemia: - replete in TNA. Replete as needed and follow BMP.  6. Anemia: Patient has a history of chronic anemia.  -some worsening with acute illness -monitor and transfuse if <7  7. Arrhythmia(NSVT/SVT/Afib)/Hypotension: Overnight on 07/18/13, patient developed tachyarrhythmias, with V. Tach, SVT and  A,fib with RVR. Dr Armanda Magic provided cardiology consultation, patient was managed with Amiodarone bolus, iv Lopressor and Amiodarone infusion, subsequently rapid A. Fib, followed by Hypotension and was bolused followed by NeoSynephrine. Fortunately,  patient responded rather dramatically, with resolution of hypotension. NeoSynephrine was discontinued on 07/20/13, without deleterious effect. Still in SR and HR is satisfactory. Patient had an episode of nonsustained polymorphic VT on 7/24 at 2:40 AM. Otherwise maintaining sinus rhythm. On amiodarone infusion. Cardiology following. Heparin infusion discontinued 7/27.  8. History of colitis:  Patient has known history of Collagenous colitis, on Entocort. This appears clinically stable at this time. On stress doses of steroids, will cut down dose . 9. CAD/Demand ischemia: Known history of CAD, S/P PCI/Stent. As described above, had bump in CEs. CEs have leveled off. Patient likely had demand ischemia. Heparin infusion DC'd 7/27. Cardiology following.  10. GERD: Asymptomatic. On IV Pepcid.  11. LE wounds: Patient has chronic healing LLE wound, possibly associated with venous insufficiency. Regularly follows up at the wound care clinic. WOC consulted and recommendations implemented.   12.Protein-Calorie malnutrition: Clinically, patient appears severely malnourished. Will need nutritionist input in due course. TNA to be started on 7/25.  Ambulate, Pt/Ot Dc foley and A line  GI prophylaxis: IV Pepcid Activity: Up with assistance DVT prophylaxis: Lovenox Nutrition: N.p.o. Code Status: Partial Family Communication: None today at bedside. Disposition Plan: tarsnfer to tele   Consultants:  General surgery.   Cardiology.  Critical care medicine  Infectious disease  Procedures:  PICC line 7/25-7/28  Foley catheter  NG tube to low wall suction  Exploratory laparotomy, small bowel resection and lysis of adhesion on 7/28  Antibiotics:  IV vancomycin 7/27 >  IV Zosyn 7/27 >  HPI/Subjective:  Feels well, in good spirits, ambulating with RN  Objective: Vital signs in last 24 hours: Temp:  [97.4 F (36.3 C)-98.5 F (36.9 C)] 97.4 F (36.3 C) (07/30 0800) Pulse Rate:  [47-67]  61 (07/30 1200) Resp:  [9-19] 14 (07/30 1200) BP: (122)/(84) 122/84 mmHg (07/30 1200) SpO2:  [89 %-100 %] 97 % (07/30 1200) Weight:  [51.1 kg (112 lb 10.5 oz)-51.6 kg (113 lb 12.1 oz)] 51.6 kg (113 lb 12.1 oz) (07/30 0400) Weight change:  Last BM Date: 07/17/13  Intake/Output from previous day: 07/29 0701 - 07/30 0700 In: 2030.8 [I.V.:1600.8; NG/GT:30; IV Piggyback:400] Out: 1375 [Urine:1325; Emesis/NG output:50] Total I/O In: 433.5 [I.V.:133.5; IV Piggyback:300] Out: 75 [Urine:75]   Physical Exam: General: Pleasant and comfortable. Hard of hearing. Frail elderly female.  CHEST:   Reduced breath sounds in the bases. No increased work of breathing. HEART:  Sounds 1 and 2 heard, normal, regular. No JVD or pedal edema. 2/6 systolic ejection murmur at apex. Telemetry: Sinus rhythm in the 60s/70s. No further arrhythmias. ABDOMEN:  Abdomen not distended, soft, non-tender, no palpable organomegaly, no palpable masses. Few bowel sounds present. LOWER EXTREMITIES:  No pitting edema, palpable peripheral pulses. Has dressings on LLE.  CENTRAL NERVOUS SYSTEM:  No focal neurologic deficit on gross examination. Alert and oriented.  Lab Results:  Recent Labs  07/27/13 0415 07/28/13 0415  WBC 12.5* 9.5  HGB 8.6* 8.6*  HCT 27.3* 26.4*  PLT 158 206    Recent Labs  07/27/13 0415 07/28/13 0415  NA 135 134*  K 4.4 3.5  CL 106 103  CO2 23 23  GLUCOSE 108* 100*  BUN 21 18  CREATININE 0.81 0.80  CALCIUM 7.5* 7.5*   Recent Results (from the past 240 hour(s))  MRSA PCR SCREENING     Status: None   Collection Time    07/19/13 11:47 AM      Result Value Range Status   MRSA by PCR NEGATIVE  NEGATIVE Final   Comment:            The GeneXpert MRSA Assay (FDA     approved for NASAL specimens     only), is one component of a     comprehensive MRSA colonization     surveillance program. It is not     intended to diagnose MRSA     infection nor to guide or     monitor treatment for      MRSA infections.  CULTURE, BLOOD (ROUTINE X 2)     Status: None   Collection Time    07/25/13  8:15 AM      Result Value Range Status   Specimen Description BLOOD RIGHT ARM   Final   Special Requests BOTTLES DRAWN AEROBIC AND ANAEROBIC [facility] EACH   Final   Culture  Setup Time 07/25/2013 15:17   Final   Culture     Final   Value: STAPHYLOCOCCUS AUREUS     Note: SUSCEPTIBILITIES PERFORMED ON PREVIOUS CULTURE WITHIN THE LAST 5 DAYS.     Note: Gram Stain Report Called to,Read Back By and Verified With: Rita Ohara RN on 07/26/13 at 04:45 by Christie Nottingham   Report Status 07/28/2013 FINAL   Final  CULTURE, BLOOD (ROUTINE X 2)     Status: None   Collection Time    07/25/13  8:36 AM      Result Value Range Status   Specimen Description BLOOD RIGHT HAND   Final   Special Requests BOTTLES DRAWN AEROBIC AND ANAEROBIC [facility] EACH   Final   Culture  Setup Time 07/25/2013 15:17   Final   Culture     Final   Value: STAPHYLOCOCCUS AUREUS     Note: RIFAMPIN AND GENTAMICIN SHOULD NOT BE USED AS SINGLE DRUGS FOR TREATMENT OF STAPH INFECTIONS.     Note: Gram Stain Report Called to,Read Back By and Verified With: Rita Ohara RN on 07/26/13 at 04:45 by Christie Nottingham   Report Status 07/28/2013 FINAL   Final   Organism ID, Bacteria STAPHYLOCOCCUS AUREUS   Final  CULTURE, BLOOD (ROUTINE X 2)     Status: None   Collection Time    07/26/13  3:59 PM      Result Value Range Status   Specimen Description BLOOD LEFT ARM   Final   Special Requests BOTTLES DRAWN AEROBIC ONLY 6CC   Final   Culture  Setup Time 07/26/2013 21:17   Final   Culture     Final   Value:        BLOOD CULTURE RECEIVED NO GROWTH TO DATE CULTURE WILL BE HELD FOR 5 DAYS BEFORE ISSUING A FINAL NEGATIVE REPORT   Report Status PENDING   Incomplete  CULTURE, BLOOD (ROUTINE X 2)     Status: None   Collection Time    07/27/13  3:09 PM      Result Value Range Status   Specimen Description BLOOD RIGHT ARM   Final   Special Requests BOTTLES DRAWN AEROBIC ONLY  1CC   Final   Culture  Setup Time 07/27/2013 23:52   Final   Culture     Final   Value:        BLOOD CULTURE RECEIVED NO GROWTH TO DATE CULTURE WILL BE HELD  FOR 5 DAYS BEFORE ISSUING A FINAL NEGATIVE REPORT   Report Status PENDING   Incomplete     Studies/Results: Dg Chest Port 1 View  07/27/2013   *RADIOLOGY REPORT*  Clinical Data: Loculated left pleural effusion.  PORTABLE CHEST - 1 VIEW  Comparison: 07/26/2013.  Findings: The patient is rotated to the left on today's exam. Enteric tube remains present.  The left upper extremity PICC has been removed.  Aeration at the left lung base has improved.  The effusion appears to be the free flowing if no thoracentesis has been performed as there is less loculation and focal opacity at the left lung base. Dense retrocardiac opacity is present compatible with atelectasis and effusion in combination.  There is a right pleural effusion layering posteriorly with associated atelectasis. Monitoring leads are projected over the chest. Probable superimposed interstitial pulmonary edema.  IMPRESSION:  1.  Improved left lung aeration with persistent dense retrocardiac opacity likely representing effusion and atelectasis in combination. 2. Shifting right pleural effusion with right basilar atelectasis. 3.  Interstitial pulmonary edema.   Original Report Authenticated By: Andreas Newport, M.D.   Dg Chest Port 1 View  07/26/2013   *RADIOLOGY REPORT*  Clinical Data: Postop pneumonia  PORTABLE CHEST - 1 VIEW  Comparison: 07/25/2013  Findings: Cardiomediastinal silhouette is stable.  Stable NG tube position.  Atherosclerotic calcifications of thoracic aorta again noted.  There is increasing partially loculated left pleural effusion. There is streaky airspace   left upper lobe suspicious for superimposed new infiltrate.  Persistent consolidation in left lower lobe.  Small amount of streaky atelectasis or infiltrate in the right lower lobe.  IMPRESSION: There is increasing  partially loculated left pleural effusion. There is streaky airspace   left upper lobe suspicious for superimposed new infiltrate.  Persistent consolidation in left lower lobe.  Small amount of streaky atelectasis or infiltrate in the right lower lobe.   Original Report Authenticated By: Natasha Mead, M.D.    Medications: Scheduled Meds: . antiseptic oral rinse  15 mL Mouth Rinse QID  . aspirin  300 mg Rectal Daily  . chlorhexidine  15 mL Mouth/Throat BID  . famotidine (PEPCID) IV  20 mg Intravenous Q12H  . heparin  5,000 Units Subcutaneous Q8H  . hydrocortisone sod succinate (SOLU-CORTEF) inj  25 mg Intravenous Q12H  . insulin aspart  0-9 Units Subcutaneous Q8H  . metoprolol  2.5 mg Intravenous Q6H  . mometasone-formoterol  2 puff Inhalation BID  . piperacillin-tazobactam (ZOSYN)  IV  3.375 g Intravenous Q8H  . sodium chloride  10-40 mL Intracatheter Q12H  . sodium chloride  3 mL Intravenous Q12H  . triamcinolone cream  1 application Topical Weekly  . vancomycin  1,000 mg Intravenous Q24H   Continuous Infusions: . amiodarone (NEXTERONE PREMIX) 360 mg/200 mL dextrose 30 mg/hr (07/28/13 0915)   PRN Meds:.acetaminophen, albuterol, bisacodyl, diphenhydrAMINE, hydrALAZINE, LORazepam, morphine injection, morphine injection, ondansetron (ZOFRAN) IV, ondansetron (ZOFRAN) IV, ondansetron, sodium chloride    LOS: 11 days   [facility]  [facility] Pager [phone]. If 8PM-8AM, please contact night-coverage at [email or web address], password [facility] 07/28/2013, 1:26 PM  LOS: 11 days

## 2013-07-28 NOTE — Progress Notes (Signed)
2 Days Post-Op  Subjective: Alert. Stable. Afebrile. Has ambulated in the halls. Remains oriented. Denies dyspnea. Denies abdominal pain or nausea. Says she is hungry.  NG output is low.  Hemoglobin 8.6. WBC 9500. Potassium 3.5. Glucose 100. Objective: Vital signs in last 24 hours: Temp:  [98 F (36.7 C)-98.5 F (36.9 C)] 98 F (36.7 C) (07/30 0400) Pulse Rate:  [47-67] 59 (07/30 0400) Resp:  [9-19] 15 (07/30 0400) SpO2:  [89 %-100 %] 99 % (07/30 0400) Weight:  [112 lb 10.5 oz (51.1 kg)-113 lb 12.1 oz (51.6 kg)] 113 lb 12.1 oz (51.6 kg) (07/30 0400) Last BM Date: 07/17/13  Intake/Output from previous day: 07/29 0701 - 07/30 0700 In: 1830.7 [I.V.:1400.7; NG/GT:30; IV Piggyback:400] Out: 1300 [Urine:1250; Emesis/NG output:50] Intake/Output this shift: Total I/O In: 700.3 [I.V.:600.3; IV Piggyback:100] Out: 310 [Urine:310]  General appearance: N. Elderly. The condition. Alert. Cooperative. In no distress. GI: abdomen soft. Much less distended. Wound looks fine. Occasional bowel sounds.  Lab Results:  Results for orders placed during the hospital encounter of 07/17/13 (from the past 24 hour(s))  GLUCOSE, CAPILLARY     Status: Abnormal   Collection Time    07/27/13  8:08 AM      Result Value Range   Glucose-Capillary 127 (*) 70 - 99 mg/dL   Comment 1 Documented in Chart    GLUCOSE, CAPILLARY     Status: None   Collection Time    07/27/13  3:33 PM      Result Value Range   Glucose-Capillary 87  70 - 99 mg/dL  GLUCOSE, CAPILLARY     Status: Abnormal   Collection Time    07/27/13 11:37 PM      Result Value Range   Glucose-Capillary 108 (*) 70 - 99 mg/dL   Comment 1 Notify RN    MAGNESIUM     Status: None   Collection Time    07/28/13  4:15 AM      Result Value Range   Magnesium 2.1  1.5 - 2.5 mg/dL  PHOSPHORUS     Status: None   Collection Time    07/28/13  4:15 AM      Result Value Range   Phosphorus 3.2  2.3 - 4.6 mg/dL  PRO B NATRIURETIC PEPTIDE     Status:  Abnormal   Collection Time    07/28/13  4:15 AM      Result Value Range   Pro B Natriuretic peptide (BNP) 1586.0 (*) 0 - 450 pg/mL  CBC     Status: Abnormal   Collection Time    07/28/13  4:15 AM      Result Value Range   WBC 9.5  4.0 - 10.5 K/uL   RBC 3.39 (*) 3.87 - 5.11 MIL/uL   Hemoglobin 8.6 (*) 12.0 - 15.0 g/dL   HCT 16.1 (*) 09.6 - 04.5 %   MCV 77.9 (*) 78.0 - 100.0 fL   MCH 25.4 (*) 26.0 - 34.0 pg   MCHC 32.6  30.0 - 36.0 g/dL   RDW 40.9 (*) 81.1 - 91.4 %   Platelets 206  150 - 400 K/uL  BASIC METABOLIC PANEL     Status: Abnormal   Collection Time    07/28/13  4:15 AM      Result Value Range   Sodium 134 (*) 135 - 145 mEq/L   Potassium 3.5  3.5 - 5.1 mEq/L   Chloride 103  96 - 112 mEq/L   CO2 23  19 -  32 mEq/L   Glucose, Bld 100 (*) 70 - 99 mg/dL   BUN 18  6 - 23 mg/dL   Creatinine, Ser 1.61  0.50 - 1.10 mg/dL   Calcium 7.5 (*) 8.4 - 10.5 mg/dL   GFR calc non Af Amer 64 (*) >90 mL/min   GFR calc Af Amer 75 (*) >90 mL/min     Studies/Results: @RISRSLT24 @  . antiseptic oral rinse  15 mL Mouth Rinse QID  . aspirin  300 mg Rectal Daily  . chlorhexidine  15 mL Mouth/Throat BID  . famotidine (PEPCID) IV  20 mg Intravenous Q12H  . heparin  5,000 Units Subcutaneous Q8H  . hydrocortisone sod succinate (SOLU-CORTEF) inj  25 mg Intravenous Q12H  . insulin aspart  0-9 Units Subcutaneous Q8H  . metoprolol  2.5 mg Intravenous Q6H  . mometasone-formoterol  2 puff Inhalation BID  . piperacillin-tazobactam (ZOSYN)  IV  3.375 g Intravenous Q8H  . sodium chloride  10-40 mL Intracatheter Q12H  . sodium chloride  3 mL Intravenous Q12H  . triamcinolone cream  1 application Topical Weekly  . vancomycin  1,000 mg Intravenous Q24H     Assessment/Plan:  POD #2. Laparotomy, lysis of adhesions,Small bowel resection  Stable  Continue NG suction, n.p.o. Hope that ileus will resolve soon and we can resume diet. Not yet. mobilize out of bed.   ID. - On Vanc.  and Zosyn per  primary service. ID following. There is no indication for antibiotics from the standpoint of her bowel surgery.  Steroids -Unless there is a significant clinical indication for steroids, I would discontinue these because of their adverse affects on wound healing and immune competency.  Protein calorie malnutrition. Will need new IV access for TNA.    Suspect LLL pneumonia.No increased work of breathing postop. Chest x-ray this morning  Moderate to severe aortic stenosis and cardiac arrhythmias, and recent NSTEMI per cardiology  Hypokalemia, Borderline. Follow closely. Bacteremia, probably staph, PICC line removed.    @PROBHOSP @  LOS: 11 days    Donnavin Vandenbrink M. Derrell Lolling, M.D., [facility] General and Minimally invasive Surgery Breast and Colorectal Surgery Office:   [phone] Pager:   [phone]  07/28/2013  . .prob

## 2013-07-28 NOTE — Progress Notes (Signed)
INFECTIOUS DISEASE PROGRESS NOTE  ID: MAALLE Ariel Hunt is a 79 y.o. female with  Principal Problem:   Small bowel obstruction Active Problems:   COLLAGENOUS COLITIS   Hypertension   CAD (coronary artery disease)   Hyperlipidemia   Anemia   Heart murmur, systolic   DNR (do not resuscitate)   Aortic stenosis, severe   Arrhythmia   NSTEMI (non-ST elevated myocardial infarction)   Ventricular tachycardia   Hypokalemia   Hyperkalemia   HCAP (healthcare-associated pneumonia)   Acute systolic CHF (congestive heart failure)   Preoperative respiratory examination   Bacteremia due to Staphylococcus  Subjective: "I feel better",  "My kidneys weren't working good"  Abtx:  Anti-infectives   Start     Dose/Rate Route Frequency Ordered Stop   07/26/13 0932  cefOXitin (MEFOXIN) 2 g in dextrose 5 % 50 mL IVPB  Status:  Discontinued     2 g 100 mL/hr over 30 Minutes Intravenous 30 min pre-op 07/26/13 0925 07/26/13 1415   07/26/13 0900  vancomycin (VANCOCIN) IVPB 1000 mg/200 mL premix     1,000 mg 200 mL/hr over 60 Minutes Intravenous Every 24 hours 07/26/13 0827     07/25/13 1000  vancomycin (VANCOCIN) IVPB 750 mg/150 ml premix  Status:  Discontinued     750 mg 150 mL/hr over 60 Minutes Intravenous Every 24 hours 07/25/13 0836 07/26/13 0827   07/25/13 1000  piperacillin-tazobactam (ZOSYN) IVPB 3.375 g     3.375 g 12.5 mL/hr over 240 Minutes Intravenous Every 8 hours 07/25/13 0836        Medications:  Scheduled: . antiseptic oral rinse  15 mL Mouth Rinse QID  . aspirin  300 mg Rectal Daily  . chlorhexidine  15 mL Mouth/Throat BID  . famotidine (PEPCID) IV  20 mg Intravenous Q12H  . heparin  5,000 Units Subcutaneous Q8H  . hydrocortisone sod succinate (SOLU-CORTEF) inj  12.5 mg Intravenous Q12H  . insulin aspart  0-9 Units Subcutaneous Q8H  . metoprolol  2.5 mg Intravenous Q6H  . mometasone-formoterol  2 puff Inhalation BID  . piperacillin-tazobactam (ZOSYN)  IV  3.375 g  Intravenous Q8H  . sodium chloride  10-40 mL Intracatheter Q12H  . sodium chloride  3 mL Intravenous Q12H  . triamcinolone cream  1 application Topical Weekly  . vancomycin  1,000 mg Intravenous Q24H    Objective: Vital signs in last 24 hours: Temp:  [97.4 F (36.3 C)-98.2 F (36.8 C)] 98 F (36.7 C) (07/30 1438) Pulse Rate:  [47-67] 61 (07/30 1438) Resp:  [11-20] 20 (07/30 1438) BP: (120-122)/(57-84) 120/57 mmHg (07/30 1438) SpO2:  [89 %-100 %] 100 % (07/30 1438) Weight:  [51.6 kg (113 lb 12.1 oz)] 51.6 kg (113 lb 12.1 oz) (07/30 0400)   General appearance: alert, cooperative and pale Resp: clear to auscultation bilaterally Cardio: regular rate and rhythm GI: normal findings: bowel sounds normal and soft, non-tender  Lab Results  Recent Labs  07/27/13 0415 07/28/13 0415  WBC 12.5* 9.5  HGB 8.6* 8.6*  HCT 27.3* 26.4*  NA 135 134*  K 4.4 3.5  CL 106 103  CO2 23 23  BUN 21 18  CREATININE 0.81 0.80   Liver Panel  Recent Labs  07/26/13 0430  PROT 4.3*  ALBUMIN 1.9*  AST 19  ALT 16  ALKPHOS 41  BILITOT 0.3   Sedimentation Rate No results found for this basename: ESRSEDRATE,  in the last 72 hours C-Reactive Protein No results found for this basename: CRP,  in the last 72 hours  Microbiology: Recent Results (from the past 240 hour(s))  MRSA PCR SCREENING     Status: None   Collection Time    07/19/13 11:47 AM      Result Value Range Status   MRSA by PCR NEGATIVE  NEGATIVE Final   Comment:            The GeneXpert MRSA Assay (FDA     approved for NASAL specimens     only), is one component of a     comprehensive MRSA colonization     surveillance program. It is not     intended to diagnose MRSA     infection nor to guide or     monitor treatment for     MRSA infections.  CULTURE, BLOOD (ROUTINE X 2)     Status: None   Collection Time    07/25/13  8:15 AM      Result Value Range Status   Specimen Description BLOOD RIGHT ARM   Final   Special  Requests BOTTLES DRAWN AEROBIC AND ANAEROBIC [facility] EACH   Final   Culture  Setup Time 07/25/2013 15:17   Final   Culture     Final   Value: STAPHYLOCOCCUS AUREUS     Note: SUSCEPTIBILITIES PERFORMED ON PREVIOUS CULTURE WITHIN THE LAST 5 DAYS.     Note: Gram Stain Report Called to,Read Back By and Verified With: Rita Ohara RN on 07/26/13 at 04:45 by Christie Nottingham   Report Status 07/28/2013 FINAL   Final  CULTURE, BLOOD (ROUTINE X 2)     Status: None   Collection Time    07/25/13  8:36 AM      Result Value Range Status   Specimen Description BLOOD RIGHT HAND   Final   Special Requests BOTTLES DRAWN AEROBIC AND ANAEROBIC [facility] EACH   Final   Culture  Setup Time 07/25/2013 15:17   Final   Culture     Final   Value: STAPHYLOCOCCUS AUREUS     Note: RIFAMPIN AND GENTAMICIN SHOULD NOT BE USED AS SINGLE DRUGS FOR TREATMENT OF STAPH INFECTIONS.     Note: Gram Stain Report Called to,Read Back By and Verified With: Rita Ohara RN on 07/26/13 at 04:45 by Christie Nottingham   Report Status 07/28/2013 FINAL   Final   Organism ID, Bacteria STAPHYLOCOCCUS AUREUS   Final  CULTURE, BLOOD (ROUTINE X 2)     Status: None   Collection Time    07/26/13  3:59 PM      Result Value Range Status   Specimen Description BLOOD LEFT ARM   Final   Special Requests BOTTLES DRAWN AEROBIC ONLY 6CC   Final   Culture  Setup Time 07/26/2013 21:17   Final   Culture     Final   Value:        BLOOD CULTURE RECEIVED NO GROWTH TO DATE CULTURE WILL BE HELD FOR 5 DAYS BEFORE ISSUING A FINAL NEGATIVE REPORT   Report Status PENDING   Incomplete  CULTURE, BLOOD (ROUTINE X 2)     Status: None   Collection Time    07/27/13  3:09 PM      Result Value Range Status   Specimen Description BLOOD RIGHT ARM   Final   Special Requests BOTTLES DRAWN AEROBIC ONLY 1CC   Final   Culture  Setup Time 07/27/2013 23:52   Final   Culture     Final   Value:  BLOOD CULTURE RECEIVED NO GROWTH TO DATE CULTURE WILL BE HELD FOR 5 DAYS BEFORE ISSUING A FINAL  NEGATIVE REPORT   Report Status PENDING   Incomplete    Studies/Results: Dg Chest Port 1 View  07/27/2013   *RADIOLOGY REPORT*  Clinical Data: Loculated left pleural effusion.  PORTABLE CHEST - 1 VIEW  Comparison: 07/26/2013.  Findings: The patient is rotated to the left on today's exam. Enteric tube remains present.  The left upper extremity PICC has been removed.  Aeration at the left lung base has improved.  The effusion appears to be the free flowing if no thoracentesis has been performed as there is less loculation and focal opacity at the left lung base. Dense retrocardiac opacity is present compatible with atelectasis and effusion in combination.  There is a right pleural effusion layering posteriorly with associated atelectasis. Monitoring leads are projected over the chest. Probable superimposed interstitial pulmonary edema.  IMPRESSION:  1.  Improved left lung aeration with persistent dense retrocardiac opacity likely representing effusion and atelectasis in combination. 2. Shifting right pleural effusion with right basilar atelectasis. 3.  Interstitial pulmonary edema.   Original Report Authenticated By: Andreas Newport, M.D.     Assessment/Plan: Staph bacteremia due to [facility]? Loculated Pleural Effusion (L)  SBO, s/p resection 7/28  Total days of antibiotics: 5 (vanco/zosyn)  TTE does not show vegetation BCx 2/2 MSSA, repeat BCx are ngtd.  Will change her anbx to ancef alone Plan for 2 weeks of rx with repeat BCx when she has been off therapy at least 1 week.           Johny Sax Infectious Diseases (pager) [phone] [email or web address] 07/28/2013, 4:11 PM  LOS: 11 days

## 2013-07-28 NOTE — Progress Notes (Signed)
Subjective:  Sitting up in chair, NG tube in place, comfortable, smiling. Walked in the hallway  Objective:  Vital Signs in the last 24 hours: Temp:  [97.4 F (36.3 C)-98.5 F (36.9 C)] 97.4 F (36.3 C) (07/30 0800) Pulse Rate:  [47-67] 62 (07/30 0800) Resp:  [9-19] 17 (07/30 0800) SpO2:  [89 %-100 %] 97 % (07/30 0800) Weight:  [51.1 kg (112 lb 10.5 oz)-51.6 kg (113 lb 12.1 oz)] 51.6 kg (113 lb 12.1 oz) (07/30 0400)  Intake/Output from previous day: 07/29 0701 - 07/30 0700 In: 2030.8 [I.V.:1600.8; NG/GT:30; IV Piggyback:400] Out: 1375 [Urine:1325; Emesis/NG output:50]   Physical Exam: General: Frail, elderly, in no acute distress. Head:  Normocephalic and atraumatic. NG tube Lungs: Mild crackles left base. Heart: Normal S1 and S2.  2/6 systolic murmur, no rubs or gallops.  Abdomen: soft, non-tender, positive bowel sounds. Extremities: No clubbing or cyanosis. No edema. SCDs Neurologic: Alert and oriented x 3.    Lab Results:  Recent Labs  07/27/13 0415 07/28/13 0415  WBC 12.5* 9.5  HGB 8.6* 8.6*  PLT 158 206    Recent Labs  07/27/13 0415 07/28/13 0415  NA 135 134*  K 4.4 3.5  CL 106 103  CO2 23 23  GLUCOSE 108* 100*  BUN 21 18  CREATININE 0.81 0.80   Hepatic Function Panel  Recent Labs  07/26/13 0430  PROT 4.3*  ALBUMIN 1.9*  AST 19  ALT 16  ALKPHOS 41  BILITOT 0.3     Imaging: Dg Chest Port 1 View  07/27/2013   *RADIOLOGY REPORT*  Clinical Data: Loculated left pleural effusion.  PORTABLE CHEST - 1 VIEW  Comparison: 07/26/2013.  Findings: The patient is rotated to the left on today's exam. Enteric tube remains present.  The left upper extremity PICC has been removed.  Aeration at the left lung base has improved.  The effusion appears to be the free flowing if no thoracentesis has been performed as there is less loculation and focal opacity at the left lung base. Dense retrocardiac opacity is present compatible with atelectasis and effusion in  combination.  There is a right pleural effusion layering posteriorly with associated atelectasis. Monitoring leads are projected over the chest. Probable superimposed interstitial pulmonary edema.  IMPRESSION:  1.  Improved left lung aeration with persistent dense retrocardiac opacity likely representing effusion and atelectasis in combination. 2. Shifting right pleural effusion with right basilar atelectasis. 3.  Interstitial pulmonary edema.   Original Report Authenticated By: Andreas Newport, M.D.   Dg Chest Port 1 View  07/26/2013   *RADIOLOGY REPORT*  Clinical Data: Postop pneumonia  PORTABLE CHEST - 1 VIEW  Comparison: 07/25/2013  Findings: Cardiomediastinal silhouette is stable.  Stable NG tube position.  Atherosclerotic calcifications of thoracic aorta again noted.  There is increasing partially loculated left pleural effusion. There is streaky airspace   left upper lobe suspicious for superimposed new infiltrate.  Persistent consolidation in left lower lobe.  Small amount of streaky atelectasis or infiltrate in the right lower lobe.  IMPRESSION: There is increasing partially loculated left pleural effusion. There is streaky airspace   left upper lobe suspicious for superimposed new infiltrate.  Persistent consolidation in left lower lobe.  Small amount of streaky atelectasis or infiltrate in the right lower lobe.   Original Report Authenticated By: Natasha Mead, M.D.     Telemetry: Sinus rhythm, no atrial fibrillation, occasional PAC, no ventricular tachycardia Personally viewed.    Cardiac Studies:  Echocardiogram: - Left ventricle: Systolic function  was mildly to moderately reduced. The estimated ejection fraction was in the range of 40% to 45%. Diffuse hypokinesis. - Aortic valve: Trivial regurgitation. - Mitral valve: Calcified annulus. Mildly thickened leaflets . Moderate regurgitation. - Left atrium: The atrium was mildly dilated. - Right atrium: The atrium was mildly dilated. - Atrial  septum: There was no shunt. - Pericardium, extracardiac: There was a left pleural effusion. Impressions:  - Vegetation is not identified.   Assessment/Plan:  Principal Problem:   Small bowel obstruction Active Problems:   COLLAGENOUS COLITIS   Hypertension   CAD (coronary artery disease)   Hyperlipidemia   Anemia   Heart murmur, systolic   DNR (do not resuscitate)   Aortic stenosis, severe   Arrhythmia   NSTEMI (non-ST elevated myocardial infarction)   Ventricular tachycardia   Hypokalemia   Hyperkalemia   HCAP (healthcare-associated pneumonia)   Acute systolic CHF (congestive heart failure)   Preoperative respiratory examination   Bacteremia due to Staphylococcus   1. Atrial fibrillation-currently very stable on IV amiodarone. Convert to by mouth when able. Normal sinus rhythm currently.  2. Ventricular tachycardia-no further bouts. IV amiodarone. Keep potassium greater than 4.  3. Bacteremia-line change. Echocardiogram reassuring with no evidence of vegetation. ID note reviewed.  4. Decreased ejection fraction-echocardiogram demonstrated an EF of 40-45%, decreased from prior. This may be a function of recent stressors. No evidence of heart failure currently. Doing well. We will hopefully see resolution of her ejection fraction in the future. Echocardiogram from week prior showed normal ejection fraction.  5. Moderate to severe aortic stenosis-stable.  SKAINS, MARK 07/28/2013, 8:08 AM

## 2013-07-29 ENCOUNTER — Inpatient Hospital Stay (HOSPITAL_COMMUNITY): Payer: Medicare Other

## 2013-07-29 DIAGNOSIS — E876 Hypokalemia: Secondary | ICD-10-CM

## 2013-07-29 LAB — CBC
HCT: 28.8 % — ABNORMAL LOW (ref 36.0–46.0)
Hemoglobin: 9.5 g/dL — ABNORMAL LOW (ref 12.0–15.0)
MCH: 25.5 pg — ABNORMAL LOW (ref 26.0–34.0)
MCV: 77.4 fL — ABNORMAL LOW (ref 78.0–100.0)
Platelets: 212 10*3/uL (ref 150–400)
RBC: 3.72 MIL/uL — ABNORMAL LOW (ref 3.87–5.11)
WBC: 8 10*3/uL (ref 4.0–10.5)

## 2013-07-29 LAB — COMPREHENSIVE METABOLIC PANEL
ALT: 16 U/L (ref 0–35)
AST: 12 U/L (ref 0–37)
Albumin: 1.8 g/dL — ABNORMAL LOW (ref 3.5–5.2)
Alkaline Phosphatase: 45 U/L (ref 39–117)
Calcium: 7.8 mg/dL — ABNORMAL LOW (ref 8.4–10.5)
GFR calc Af Amer: 86 mL/min — ABNORMAL LOW (ref >90)
Glucose, Bld: 86 mg/dL (ref 70–99)
Potassium: 3 mEq/L — ABNORMAL LOW (ref 3.5–5.1)
Sodium: 135 mEq/L (ref 135–145)
Total Protein: 4.5 g/dL — ABNORMAL LOW (ref 6.0–8.3)

## 2013-07-29 LAB — GLUCOSE, CAPILLARY
Glucose-Capillary: 131 mg/dL — ABNORMAL HIGH (ref 70–99)
Glucose-Capillary: 76 mg/dL (ref 70–99)
Glucose-Capillary: 78 mg/dL (ref 70–99)

## 2013-07-29 MED ORDER — POTASSIUM CHLORIDE 10 MEQ/100ML IV SOLN
10.0000 meq | INTRAVENOUS | Status: AC
  Administered 2013-07-29 (×4): 10 meq via INTRAVENOUS
  Filled 2013-07-29 (×5): qty 100

## 2013-07-29 MED ORDER — POTASSIUM CHLORIDE 10 MEQ/50ML IV SOLN
10.0000 meq | INTRAVENOUS | Status: DC
  Filled 2013-07-29 (×4): qty 50

## 2013-07-29 MED ORDER — MILK AND MOLASSES ENEMA
Freq: Once | RECTAL | Status: AC
  Administered 2013-07-29: 240 mL via RECTAL
  Filled 2013-07-29: qty 250

## 2013-07-29 MED ORDER — POTASSIUM CHLORIDE 10 MEQ/100ML IV SOLN
10.0000 meq | INTRAVENOUS | Status: DC
  Filled 2013-07-29 (×4): qty 100

## 2013-07-29 MED ORDER — FAT EMULSION 20 % IV EMUL
240.0000 mL | INTRAVENOUS | Status: DC
  Administered 2013-07-29: 240 mL via INTRAVENOUS
  Filled 2013-07-29: qty 250

## 2013-07-29 MED ORDER — POTASSIUM CHLORIDE 10 MEQ/100ML IV SOLN
10.0000 meq | INTRAVENOUS | Status: AC
  Administered 2013-07-29 (×3): 10 meq via INTRAVENOUS
  Filled 2013-07-29 (×2): qty 100

## 2013-07-29 MED ORDER — TRACE MINERALS CR-CU-F-FE-I-MN-MO-SE-ZN IV SOLN
INTRAVENOUS | Status: DC
  Administered 2013-07-29: 17:00:00 via INTRAVENOUS
  Filled 2013-07-29: qty 1000

## 2013-07-29 NOTE — Procedures (Signed)
Successful placement of dual lumen PICC line to right basilic vein. Length 43cm Tip at lower SVC/RA No complications Ready for use.  Brayton El PA-C Interventional Radiology 07/29/2013 1:06 PM

## 2013-07-29 NOTE — Progress Notes (Signed)
Subjective:  Comfortable in bed. Did not get much sleep. Motivated to walk this afternoon. No SOB. No CP.   Objective:  Vital Signs in the last 24 hours: Temp:  [98 F (36.7 C)] 98 F (36.7 C) (07/30 2204) Pulse Rate:  [59-64] 60 (07/30 2204) Resp:  [13-20] 18 (07/30 2204) BP: (120-138)/(57-84) 138/75 mmHg (07/30 2204) SpO2:  [97 %-100 %] 98 % (07/31 0833) Weight:  [51.6 kg (113 lb 12.1 oz)] 51.6 kg (113 lb 12.1 oz) (07/31 0500)  Intake/Output from previous day: 07/30 0701 - 07/31 0700 In: 1063.6 [I.V.:433.6; IV Piggyback:550] Out: 1075 [Urine:950; Emesis/NG output:125]   Physical Exam: General: Frail, elderly, in no acute distress.  Head: Normocephalic and atraumatic. NG tube  Lungs: Mild crackles left base.  Heart: Normal S1 and S2. 2/6 systolic murmur, no rubs or gallops.  Abdomen: soft, non-tender, positive bowel sounds noted.  Extremities: No clubbing or cyanosis. No edema. SCDs  Neurologic: Alert and oriented x 3.     Lab Results:  Recent Labs  07/28/13 0415 07/29/13 0445  WBC 9.5 8.0  HGB 8.6* 9.5*  PLT 206 212    Recent Labs  07/28/13 0415 07/29/13 0445  NA 134* 135  K 3.5 3.0*  CL 103 103  CO2 23 23  GLUCOSE 100* 86  BUN 18 14  CREATININE 0.80 0.74   No results found for this basename: TROPONINI, CK, MB,  in the last 72 hours Hepatic Function Panel  Recent Labs  07/29/13 0445  PROT 4.5*  ALBUMIN 1.8*  AST 12  ALT 16  ALKPHOS 45  BILITOT 0.3   Telemetry: NSR, no afib Personally viewed.  Cardiac Studies:  EF 45%, mod to severe AS  Assessment/Plan:  Principal Problem:   Small bowel obstruction Active Problems:   COLLAGENOUS COLITIS   Hypertension   CAD (coronary artery disease)   Hyperlipidemia   Anemia   Heart murmur, systolic   DNR (do not resuscitate)   Aortic stenosis, severe   Arrhythmia   NSTEMI (non-ST elevated myocardial infarction)   Ventricular tachycardia   Hypokalemia   Hyperkalemia   HCAP (healthcare-associated  pneumonia)   Acute systolic CHF (congestive heart failure)   Preoperative respiratory examination   Bacteremia due to Staphylococcus   1) AFIB  - maintaining NSR on AMIO IV, metop IV  - convert to PO when able to take PO  - not felt to be an anticoagulation candidate at this point. Also great job at maintaining NSR. On heparin SQ.  - If she continues to improve, and able to take PO, may reconsider anticoagulation (perhaps low dose Eliquis 2.5mg  PO BID).    2) NSTEMI  - resolved  3) VT  - resolved, amio, K>4 goal  4) CAD  - med mgt  5) AS  - med mgt  6) SBO  - post op.   - ambulating   SKAINS, MARK 07/29/2013, 9:08 AM

## 2013-07-29 NOTE — Progress Notes (Signed)
PARENTERAL NUTRITION CONSULT NOTE - FOLLOW UP  Pharmacy Consult for TNA Indication: SBO, prolonged NPO status  No Known Allergies  Patient Measurements: Height: 5\' 4"  (162.6 cm) Weight: 113 lb 12.1 oz (51.6 kg) IBW/kg (Calculated) : 54.7 Usual Weight: 44 kg   Vital Signs:   Intake/Output from previous day: 07/30 0701 - 07/31 0700 In: 1080.3 [I.V.:450.3; IV Piggyback:550] Out: 1075 [Urine:950; Emesis/NG output:125] Intake/Output from this shift: Total I/O In: 100 [IV Piggyback:100] Out: -   Labs:  Recent Labs  07/27/13 0415 07/28/13 0415 07/29/13 0445  WBC 12.5* 9.5 8.0  HGB 8.6* 8.6* 9.5*  HCT 27.3* 26.4* 28.8*  PLT 158 206 212     Recent Labs  07/27/13 0415 07/28/13 0415 07/29/13 0445  NA 135 134* 135  K 4.4 3.5 3.0*  CL 106 103 103  CO2 23 23 23   GLUCOSE 108* 100* 86  BUN 21 18 14   CREATININE 0.81 0.80 0.74  CALCIUM 7.5* 7.5* 7.8*  MG  --  2.1 2.1  PHOS  --  3.2 3.3  PROT  --   --  4.5*  ALBUMIN  --   --  1.8*  AST  --   --  12  ALT  --   --  16  ALKPHOS  --   --  45  BILITOT  --   --  0.3   Estimated Creatinine Clearance: 40.4 ml/min (by C-G formula based on Cr of 0.74).    Recent Labs  07/29/13 0009 07/29/13 0355 07/29/13 0745  GLUCAP 85 72 78    CBGs & Insulin requirements past 24 hours:  - CBGs < 150, required 0 units of sensitive SSI  Nutritional Goals:  - RD recs 7/25: 1300-1500 Kcal, 50-60 g protein, > 1.2 L fluid - Clinimix E5/15 at goal 73ml/hr with daily lipids will provide 60g protein, 1332kcal  Current nutrition:  - Diet: NPO  - TNA:  on hold for access - mIVF: saline flushes  Assessment:  87 yof with h/o SBO (resolved with conservative treatments) admitted 7/19 with SBO.  Patient underwent ex lap, LOA, small bowel resection 7/28 PM.  TNA started 7/25 given prolonged NPO status.  PICC removed 7/28 given staph aureus bacteremia; therefore, TNA has been on hold.  ID following and pt on IV Ancef for MSSA  Repeat blood  cultures are no growth to date so IR to place new PICC today and TNA to resume this evening.   Labs: Electrolytes:  K+ 3.0. MD ordered 5 KCl runs this AM.  Keep K>4 per cardiology given ventricular tachycardia so will order an additional 4 runs for this afternoon.  Other lytes WNL.  F/u K+ in AM.  Other lytes WNL.  Corr Ca 9.56. Renal Function: stable/wnl.  Hepatic Function: stable/wnl Pre-Albumin: 13.8 (7/25), 8 (7/28) Triglycerides: wnl CBGs: controlled on Novolog sensitive SSI.  Plan:    At 1800, start Clinimix E 5/15 at 40 ml/hr and IV fat emulsion 20% at 10 ml/hr.  TNA to contain multivitamin and trace elements daily.  Continue sensitive scale SSI q8h.   KCl 10 mEq/100 mL IV x 4 runs this afternoon (in addition to 5 runs ordered by MD this AM).  Important to keep K+>4 per cardiology.    TNA labs Monday/Thursdays. BMET in AM.   Pharmacy will follow up daily.    Clance Boll, PharmD, BCPS Pager: [phone] 07/29/2013 12:19 PM

## 2013-07-29 NOTE — Progress Notes (Addendum)
[facility] PROGRESS NOTE  ANIJA BRICKNER WJX:[id number] DOB: December 10, 1923 DOA: 07/17/2013 PCP: Daisy Floro, MD  Assessment/Plan: Brief narrative: 79 y.o. female with hx of mild to moderate aortic stenosis, last ECHO about a year ago, hx of CAD with cardiac stent, prior SBO felt to be secondary to adhesions, resolved with conservative treatments several times, hx of HTN, dyslipidemia, GERD, asthma, anemia of unclear etiology, presented to the ER with increased abdominal pain and distention for 2 days.  CT Abdomen/Pelvis showed SBO with transitional point in the small bowel, query luminal narrowing. [name] consulted surgery and hospitalist was asked to admit her for SBO and anemia. Despite conservative measures, SBO did not resolve. Patient was a high risk surgical candidate but since she did not resolve with conservative measures, after careful consideration and optimizing her medical care, she underwent surgery on 7/28. She developed cardiac arrhythmias (A. fib and NSVT) a few days back and was transferred to the step down unit and is on IV amiodarone with no further episodes. She also spiked fever secondary to staph bacteremia. Surgery, cardiology continue to see patient. ID following as well.  Assessment & plan:   1. SBO:  Patient was initially treated conservatively with bowel rest, NG tube decompression, IV fluids and mobilization but failed. Once her medical conditions were stabilized, she underwent Ex- lap, lysis of adhesions and small bowel resection on 7/28.        -had NG tube for ileus, output minimal, NG Dced 7/31       -diet per CCS  2. Fever due to 3     -empiric Vanc/Zosyn stopped 7/30  3. MSSA bacteremia:suspected to be PICC related.  Infectious disease consulted and as per recommendations, PICC line was removed on 7/28. Surveillance blood cultures x 2 on 7/29 neg to date.       -replaced PICC 7/31 since repeat cultures remain negative       -was initially on IV VAnc, now on  IV Ancef for 2 weeks  with repeat BCx when she has been off therapy at least 1 week.   4. Volume overload/? Acute systolic CHF: Patient's dyspnea improved after a dose of IV Lasix on 7/27.  -stopped IVF, on TNA now -ECHo with normal Ef and mod to severe AS  5.Hypokalemia/hyperkalemia/hypomagnesemia/hypocalcemia: - replete in TNA. Replete as needed and follow BMP.  6. Anemia: Patient has a history of chronic anemia.  -some worsening with acute illness -monitor and transfuse if <7  7. Arrhythmia(NSVT/SVT/Afib)/Hypotension: Overnight on 07/18/13, patient developed tachyarrhythmias, with V. Tach, SVT and A,fib with RVR. Dr Armanda Magic provided cardiology consultation, patient was managed with Amiodarone bolus, iv Lopressor and Amiodarone infusion, subsequently rapid A. Fib, followed by Hypotension and was bolused followed by NeoSynephrine. Fortunately, patient responded rather dramatically, with resolution of hypotension. NeoSynephrine was discontinued on 07/20/13, without deleterious effect. Still in SR and HR is satisfactory. Patient had an episode of nonsustained polymorphic VT on 7/24 at 2:40 AM. Otherwise maintaining sinus rhythm. On amiodarone infusion. Cardiology following. Heparin infusion discontinued 7/27.  8. History of colitis:  Patient has known history of Collagenous colitis, on Entocort. This appears clinically stable at this time. Was on stress doses of steroids, will stop this and resume entercort when PO resumed . 9. CAD/Demand ischemia: Known history of CAD, S/P PCI/Stent. As described above, had bump in CEs. CEs have leveled off. Patient likely had demand ischemia. Heparin infusion DC'd 7/27. Cardiology following.  10. GERD: Asymptomatic. On IV Pepcid.  11. LE  wounds: Patient has chronic healing LLE wound, possibly associated with venous insufficiency. Regularly follows up at the wound care clinic. WOC consulted and recommendations implemented.   12.Protein-Calorie malnutrition:  Clinically, patient appears severely malnourished. TNA started on 7/25.  Ambulate, Pt/Ot Foley and Aline removed, Ambulate PT/Ot eval  GI prophylaxis: IV Pepcid Activity: Up with assistance DVT prophylaxis: Lovenox   Code Status: Partial Family Communication: None today at bedside. Disposition Plan:  Tele, SNF early next week if stable   Consultants:  General surgery.   Cardiology.  Critical care medicine  Infectious disease  Procedures:  PICC line 7/25-7/28  Foley catheter  NG tube to low wall suction  Exploratory laparotomy, small bowel resection and lysis of adhesion on 7/28  PICC replaced 7/31  Antibiotics:  IV vancomycin 7/27 >7/30  IV Zosyn 7/27 >7/30  Ancef 7/30  HPI/Subjective:  Feels well, in good spirits, ambulating with RN  Objective: Vital signs in last 24 hours: Temp:  [98 F (36.7 C)] 98 F (36.7 C) (07/30 2204) Pulse Rate:  [60-62] 60 (07/30 2204) Resp:  [13-20] 18 (07/30 2204) BP: (120-138)/(57-75) 138/75 mmHg (07/30 2204) SpO2:  [97 %-100 %] 98 % (07/31 0833) Weight:  [51.6 kg (113 lb 12.1 oz)] 51.6 kg (113 lb 12.1 oz) (07/31 0500) Weight change: 0.5 kg (1 lb 1.6 oz) Last BM Date: 07/17/13  Intake/Output from previous day: 07/30 0701 - 07/31 0700 In: 1080.3 [I.V.:450.3; IV Piggyback:550] Out: 1075 [Urine:950; Emesis/NG output:125] Total I/O In: 150 [IV Piggyback:150] Out: -    Physical Exam: General: Pleasant and comfortable. Hard of hearing. Frail elderly female.  CHEST:   Reduced breath sounds in the bases. No increased work of breathing. HEART:  Sounds 1 and 2 heard, normal, regular. No JVD or pedal edema. 2/6 systolic ejection murmur at apex. Telemetry: Sinus rhythm in the 60s/70s. No further arrhythmias. ABDOMEN:  Abdomen not distended, soft, non-tender, no palpable organomegaly, no palpable masses. Few bowel sounds present. LOWER EXTREMITIES:  No pitting edema, palpable peripheral pulses. Has dressings on LLE.   CENTRAL NERVOUS SYSTEM:  No focal neurologic deficit on gross examination. Alert and oriented.  Lab Results:  Recent Labs  07/28/13 0415 07/29/13 0445  WBC 9.5 8.0  HGB 8.6* 9.5*  HCT 26.4* 28.8*  PLT 206 212    Recent Labs  07/28/13 0415 07/29/13 0445  NA 134* 135  K 3.5 3.0*  CL 103 103  CO2 23 23  GLUCOSE 100* 86  BUN 18 14  CREATININE 0.80 0.74  CALCIUM 7.5* 7.8*   Recent Results (from the past 240 hour(s))  CULTURE, BLOOD (ROUTINE X 2)     Status: None   Collection Time    07/25/13  8:15 AM      Result Value Range Status   Specimen Description BLOOD RIGHT ARM   Final   Special Requests BOTTLES DRAWN AEROBIC AND ANAEROBIC [facility] EACH   Final   Culture  Setup Time 07/25/2013 15:17   Final   Culture     Final   Value: STAPHYLOCOCCUS AUREUS     Note: SUSCEPTIBILITIES PERFORMED ON PREVIOUS CULTURE WITHIN THE LAST 5 DAYS.     Note: Gram Stain Report Called to,Read Back By and Verified With: Rita Ohara RN on 07/26/13 at 04:45 by Christie Nottingham   Report Status 07/28/2013 FINAL   Final  CULTURE, BLOOD (ROUTINE X 2)     Status: None   Collection Time    07/25/13  8:36 AM  Result Value Range Status   Specimen Description BLOOD RIGHT HAND   Final   Special Requests BOTTLES DRAWN AEROBIC AND ANAEROBIC [facility] EACH   Final   Culture  Setup Time 07/25/2013 15:17   Final   Culture     Final   Value: STAPHYLOCOCCUS AUREUS     Note: RIFAMPIN AND GENTAMICIN SHOULD NOT BE USED AS SINGLE DRUGS FOR TREATMENT OF STAPH INFECTIONS.     Note: Gram Stain Report Called to,Read Back By and Verified With: Rita Ohara RN on 07/26/13 at 04:45 by Christie Nottingham   Report Status 07/28/2013 FINAL   Final   Organism ID, Bacteria STAPHYLOCOCCUS AUREUS   Final  CULTURE, BLOOD (ROUTINE X 2)     Status: None   Collection Time    07/26/13  3:59 PM      Result Value Range Status   Specimen Description BLOOD LEFT ARM   Final   Special Requests BOTTLES DRAWN AEROBIC ONLY 6CC   Final   Culture  Setup Time  07/26/2013 21:17   Final   Culture     Final   Value:        BLOOD CULTURE RECEIVED NO GROWTH TO DATE CULTURE WILL BE HELD FOR 5 DAYS BEFORE ISSUING A FINAL NEGATIVE REPORT   Report Status PENDING   Incomplete  CULTURE, BLOOD (ROUTINE X 2)     Status: None   Collection Time    07/27/13  3:09 PM      Result Value Range Status   Specimen Description BLOOD RIGHT ARM   Final   Special Requests BOTTLES DRAWN AEROBIC ONLY 1CC   Final   Culture  Setup Time 07/27/2013 23:52   Final   Culture     Final   Value:        BLOOD CULTURE RECEIVED NO GROWTH TO DATE CULTURE WILL BE HELD FOR 5 DAYS BEFORE ISSUING A FINAL NEGATIVE REPORT   Report Status PENDING   Incomplete     Studies/Results: No results found.  Medications: Scheduled Meds: . antiseptic oral rinse  15 mL Mouth Rinse QID  . aspirin  300 mg Rectal Daily  .  ceFAZolin (ANCEF) IV  2 g Intravenous Q8H  . chlorhexidine  15 mL Mouth/Throat BID  . famotidine (PEPCID) IV  20 mg Intravenous Q12H  . heparin  5,000 Units Subcutaneous Q8H  . insulin aspart  0-9 Units Subcutaneous Q8H  . metoprolol  2.5 mg Intravenous Q6H  . milk and molasses   Rectal Once  . mometasone-formoterol  2 puff Inhalation BID  . potassium chloride  10 mEq Intravenous Q1 Hr x 4  . sodium chloride  10-40 mL Intracatheter Q12H  . sodium chloride  3 mL Intravenous Q12H  . triamcinolone cream  1 application Topical Weekly   Continuous Infusions: . amiodarone (NEXTERONE PREMIX) 360 mg/200 mL dextrose 30 mg/hr (07/29/13 1001)  . Marland KitchenTPN (CLINIMIX-E) Adult     And  . fat emulsion     PRN Meds:.acetaminophen, albuterol, bisacodyl, diphenhydrAMINE, hydrALAZINE, LORazepam, morphine injection, morphine injection, ondansetron (ZOFRAN) IV, sodium chloride    LOS: 12 days   [facility]  [facility] Pager [phone]. If 8PM-8AM, please contact night-coverage at [email or web address], password [facility] 07/29/2013, 1:27 PM  LOS: 12 days

## 2013-07-29 NOTE — Progress Notes (Signed)
INFECTIOUS DISEASE PROGRESS NOTE  Ariel Hunt is a 79 y.o. female with  Principal Problem:   Small bowel obstruction Active Problems:   COLLAGENOUS COLITIS   Hypertension   CAD (coronary artery disease)   Hyperlipidemia   Anemia   Heart murmur, systolic   DNR (do not resuscitate)   Aortic stenosis, severe   Arrhythmia   NSTEMI (non-ST elevated myocardial infarction)   Ventricular tachycardia   Hypokalemia   Hyperkalemia   HCAP (healthcare-associated pneumonia)   Acute systolic CHF (congestive heart failure)   Preoperative respiratory examination   Bacteremia due to Staphylococcus  Subjective: "Id' give anything in the world to have something to eat"  Abtx:  Anti-infectives   Start     Dose/Rate Route Frequency Ordered Stop   07/28/13 1800  ceFAZolin (ANCEF) IVPB 2 g/50 mL premix     2 g 100 mL/hr over 30 Minutes Intravenous 3 times per day 07/28/13 1620     07/26/13 0932  cefOXitin (MEFOXIN) 2 g in dextrose 5 % 50 mL IVPB  Status:  Discontinued     2 g 100 mL/hr over 30 Minutes Intravenous 30 min pre-op 07/26/13 0925 07/26/13 1415   07/26/13 0900  vancomycin (VANCOCIN) IVPB 1000 mg/200 mL premix  Status:  Discontinued     1,000 mg 200 mL/hr over 60 Minutes Intravenous Every 24 hours 07/26/13 0827 07/28/13 1620   07/25/13 1000  vancomycin (VANCOCIN) IVPB 750 mg/150 ml premix  Status:  Discontinued     750 mg 150 mL/hr over 60 Minutes Intravenous Every 24 hours 07/25/13 0836 07/26/13 0827   07/25/13 1000  piperacillin-tazobactam (ZOSYN) IVPB 3.375 g  Status:  Discontinued     3.375 g 12.5 mL/hr over 240 Minutes Intravenous Every 8 hours 07/25/13 0836 07/28/13 1620      Medications:  Scheduled: . antiseptic oral rinse  15 mL Mouth Rinse QID  . aspirin  300 mg Rectal Daily  .  ceFAZolin (ANCEF) IV  2 g Intravenous Q8H  . chlorhexidine  15 mL Mouth/Throat BID  . famotidine (PEPCID) IV  20 mg Intravenous Q12H  . heparin  5,000 Units Subcutaneous Q8H  .  insulin aspart  0-9 Units Subcutaneous Q8H  . metoprolol  2.5 mg Intravenous Q6H  . mometasone-formoterol  2 puff Inhalation BID  . potassium chloride  10 mEq Intravenous Q1 Hr x 4  . sodium chloride  10-40 mL Intracatheter Q12H  . sodium chloride  3 mL Intravenous Q12H  . triamcinolone cream  1 application Topical Weekly    Objective: Vital signs in last 24 hours: Temp:  [97.5 F (36.4 C)-98 F (36.7 C)] 97.5 F (36.4 C) (07/31 1349) Pulse Rate:  [60-61] 60 (07/31 1349) Resp:  [18-20] 18 (07/31 1349) BP: (120-145)/(57-75) 145/74 mmHg (07/31 1349) SpO2:  [97 %-100 %] 100 % (07/31 1349) Weight:  [51.6 kg (113 lb 12.1 oz)] 51.6 kg (113 lb 12.1 oz) (07/31 0500)   General appearance: alert, cooperative, no distress and pale Resp: clear to auscultation bilaterally Cardio: regular rate and rhythm and systolic murmur: early systolic 3/6, crescendo at 2nd left intercostal space GI: normal findings: bowel sounds normal and soft, non-tender Extremities: no cordis felt in bilateral UE. brusing, edema unchanged.   Lab Results  Recent Labs  07/28/13 0415 07/29/13 0445  WBC 9.5 8.0  HGB 8.6* 9.5*  HCT 26.4* 28.8*  NA 134* 135  K 3.5 3.0*  CL 103 103  CO2 23 23  BUN 18 14  CREATININE 0.80 0.74   Liver Panel  Recent Labs  07/29/13 0445  PROT 4.5*  ALBUMIN 1.8*  AST 12  ALT 16  ALKPHOS 45  BILITOT 0.3   Sedimentation Rate No results found for this basename: ESRSEDRATE,  in the last 72 hours C-Reactive Protein No results found for this basename: CRP,  in the last 72 hours  Microbiology: Recent Results (from the past 240 hour(s))  CULTURE, BLOOD (ROUTINE X 2)     Status: None   Collection Time    07/25/13  8:15 AM      Result Value Range Status   Specimen Description BLOOD RIGHT ARM   Final   Special Requests BOTTLES DRAWN AEROBIC AND ANAEROBIC [facility] EACH   Final   Culture  Setup Time 07/25/2013 15:17   Final   Culture     Final   Value: STAPHYLOCOCCUS AUREUS      Note: SUSCEPTIBILITIES PERFORMED ON PREVIOUS CULTURE WITHIN THE LAST 5 DAYS.     Note: Gram Stain Report Called to,Read Back By and Verified With: Rita Ohara RN on 07/26/13 at 04:45 by Christie Nottingham   Report Status 07/28/2013 FINAL   Final  CULTURE, BLOOD (ROUTINE X 2)     Status: None   Collection Time    07/25/13  8:36 AM      Result Value Range Status   Specimen Description BLOOD RIGHT HAND   Final   Special Requests BOTTLES DRAWN AEROBIC AND ANAEROBIC [facility] EACH   Final   Culture  Setup Time 07/25/2013 15:17   Final   Culture     Final   Value: STAPHYLOCOCCUS AUREUS     Note: RIFAMPIN AND GENTAMICIN SHOULD NOT BE USED AS SINGLE DRUGS FOR TREATMENT OF STAPH INFECTIONS.     Note: Gram Stain Report Called to,Read Back By and Verified With: Rita Ohara RN on 07/26/13 at 04:45 by Christie Nottingham   Report Status 07/28/2013 FINAL   Final   Organism ID, Bacteria STAPHYLOCOCCUS AUREUS   Final  CULTURE, BLOOD (ROUTINE X 2)     Status: None   Collection Time    07/26/13  3:59 PM      Result Value Range Status   Specimen Description BLOOD LEFT ARM   Final   Special Requests BOTTLES DRAWN AEROBIC ONLY 6CC   Final   Culture  Setup Time 07/26/2013 21:17   Final   Culture     Final   Value:        BLOOD CULTURE RECEIVED NO GROWTH TO DATE CULTURE WILL BE HELD FOR 5 DAYS BEFORE ISSUING A FINAL NEGATIVE REPORT   Report Status PENDING   Incomplete  CULTURE, BLOOD (ROUTINE X 2)     Status: None   Collection Time    07/27/13  3:09 PM      Result Value Range Status   Specimen Description BLOOD RIGHT ARM   Final   Special Requests BOTTLES DRAWN AEROBIC ONLY 1CC   Final   Culture  Setup Time 07/27/2013 23:52   Final   Culture     Final   Value:        BLOOD CULTURE RECEIVED NO GROWTH TO DATE CULTURE WILL BE HELD FOR 5 DAYS BEFORE ISSUING A FINAL NEGATIVE REPORT   Report Status PENDING   Incomplete    Studies/Results: Ir Fluoro Guide Cv Line Right  07/29/2013   *RADIOLOGY REPORT*  Clinical Data: Protein calorie  malnutrition and poor venous access. PICC line requested.  Powered  PICC LINE PLACEMENT WITH ULTRASOUND AND FLUOROSCOPIC GUIDANCE  Fluoroscopy Time: 12 seconds  The right arm was prepped with chlorhexidine, draped in the usual sterile fashion using maximum barrier technique (cap and mask, sterile gown, sterile gloves, large sterile sheet, hand hygiene and cutaneous antisepsis) and infiltrated locally with 1% Lidocaine.  Ultrasound demonstrated patency of the right basilic vein, and this was documented with an image.  Under real-time ultrasound guidance, this vein was accessed with a 21 gauge micropuncture needle and image documentation was performed.  The needle was exchanged over a guidewire for a peel-away sheath through which a five [place] dual lumen PICC trimmed to 43 cm was advanced, positioned with its tip at the lower SVC/right atrial junction.  Fluoroscopy during the procedure and fluoro spot radiograph confirms appropriate catheter position.  The catheter was flushed, secured to the skin with Prolene sutures, and covered with a sterile dressing.  Complications:  None  IMPRESSION: Successful right arm powered PICC line placement with ultrasound and fluoroscopic guidance.  The catheter is ready for use.  Read by Brayton El PA-C   Original Report Authenticated By: Tacey Ruiz, MD   Ir US Guide Vasc Access Right  07/29/2013   *RADIOLOGY REPORT*  Clinical Data: Protein calorie malnutrition and poor venous access. PICC line requested.  Powered PICC LINE PLACEMENT WITH ULTRASOUND AND FLUOROSCOPIC GUIDANCE  Fluoroscopy Time: 12 seconds  The right arm was prepped with chlorhexidine, draped in the usual sterile fashion using maximum barrier technique (cap and mask, sterile gown, sterile gloves, large sterile sheet, hand hygiene and cutaneous antisepsis) and infiltrated locally with 1% Lidocaine.  Ultrasound demonstrated patency of the right basilic vein, and this was documented with an image.  Under real-time  ultrasound guidance, this vein was accessed with a 21 gauge micropuncture needle and image documentation was performed.  The needle was exchanged over a guidewire for a peel-away sheath through which a five [place] dual lumen PICC trimmed to 43 cm was advanced, positioned with its tip at the lower SVC/right atrial junction.  Fluoroscopy during the procedure and fluoro spot radiograph confirms appropriate catheter position.  The catheter was flushed, secured to the skin with Prolene sutures, and covered with a sterile dressing.  Complications:  None  IMPRESSION: Successful right arm powered PICC line placement with ultrasound and fluoroscopic guidance.  The catheter is ready for use.  Read by Brayton El PA-C   Original Report Authenticated By: Tacey Ruiz, MD     Assessment/Plan: Staph bacteremia due to [facility]?  Loculated Pleural Effusion (L)  SBO, s/p resection 7/28   Total days of antibiotics: 6 (vanco/zosyn --> ancef)  Repeat BCx are NGTD.  Plan for 2 weeks of ancef/anbx started from when her PIC was pulled (08-09-13).  Needs repeat BCx 1 week after completing her anbx Available if questions.           Johny Sax Infectious Diseases (pager) [phone] [email or web address] 07/29/2013, 2:31 PM  LOS: 12 days

## 2013-07-29 NOTE — Care Management Note (Addendum)
    Page 1 of 2   08/02/2013     2:13:11 PM   CARE MANAGEMENT NOTE 08/02/2013  Patient:  Ariel Hunt, Ariel Hunt   Account Number:  [id number]  Date Initiated:  07/18/2013  Documentation initiated by:  [facility]  Subjective/Objective Assessment:   79 year old female admitted with SBO.     Action/Plan:   From home alone.   Anticipated DC Date:  08/02/2013   Anticipated DC Plan:  SKILLED NURSING FACILITY      DC Planning Services  CM consult      Choice offered to / List presented to:  C-4 Adult Children           Status of service:  Completed, signed off Medicare Important Message given?  NA - LOS <3 / Initial given by admissions (If response is "NO", the following Medicare IM given date fields will be blank) Date Medicare IM given:   Date Additional Medicare IM given:    Discharge Disposition:  SKILLED NURSING FACILITY  Per UR Regulation:  Reviewed for med. necessity/level of care/duration of stay  If discussed at Long Length of Stay Meetings, dates discussed:   07/27/2013  07/29/2013    Comments:  08/02/13 Ariel Cloer RN,BSN NCM [phone] D/C SNF.  07/31/13 Ariel Markwardt RN,BSN NCM WEEKEND [phone] RECEIVED CALL FROM SON PHIL [facility] CHOSEN FOR HH.TC [facility] REP MARY AWARE OF REFERRAL, & FOLLOWING FOR HH ORDERS,AWARE OF ?HHRN LONG TERM IV ABX.NOTED PT-SNF,WILL DISCUSS RECOMMENDATION [facility] TOMORROW.  07/30/13 Ariel Crumbley RN,BSN NCM [phone] POD#4 LAP,LOA,SMALL BOWEL RESECTION,ILEUS RESOLVING,FULL LIQ,PICC-LONG TERM IV ABX.TNA ORDERED.AWAIT PT RECOMMENDATIONS.[facility] HAS HHC AGENCY LIST IF NEEDED FOR HHC.  07/29/13 Ariel Wiest RN,BSN NCM [phone] TRANSFER FROM SDU.HAS PICC R ARM DOUBLE LUMEN.ID-LONG TERM IV ABX.NPO.SPOKE TO SON PHIL ON PHONE TEL#[phone] ABOUT D/C PLANS.PATIENT LIVES HOME ALONE,BUT SON PHIL, & DTR WORK BUT WOULD LIKE TO KNOW MORE ABOUT HHC & PATIENT'S NEEDS.WOULD RECOMMEND PT/OT CONS.LEFT HHC AGENCY LIST IN RM AS RESOURCE.  [id number] Ariel Plater, RN, BSN,  CCN: [id number]/ Case management. Chart reviewed for discharge planning and present needs. Discharge needs: none present at time of review.  Patient had small bowel resection on [id number], episode of dyspnea , tolerated  procedure. Next chart review due:  [id number]     [id number]/Ariel Ariel Plater RN,BSN,CCM: Case management [phone] Chart reviewed and updated. Remains on iv aminodarone, sbo persist.  Next chart review due on [id number]. Needs for discharge at time of review:  None

## 2013-07-29 NOTE — Progress Notes (Signed)
NUTRITION FOLLOW UP  Intervention:   TPN per PharmD Recommend advancing diet to clear liquids Provide Resource Breeze BID once diet advanced  Nutrition Dx:   Inadequate oral intake related to inability to eat as evidenced by NPO status; ongoing  Goal:   Pt to meet >/= 90% of their estimated nutrition needs; not currently met  Monitor:   TPN initiation/rate Bowel function; SBO improving- flatus, no BM Weight; up 24 lbs from admission wt- likely due to edema Labs; low hemoglobin, low potassium, low calcium, low albumin  Assessment:   Pt underwent Ex- lap, lysis of adhesions and small bowel resection on 7/28. Had NG tube for ileus, output minimal, NG discontinued 7/31. Pt reports that she has been up walking but, can't walk as far as she could prior to surgery because she feels weak. Pt reports that her mouth is dry and she feels very hungry. TPN  was discontinued after surgery due to bacteremia, PICC line was removed. Pt continues to be NPO, PICC line was replaced today with plans to re-initiate TPN tonight. Per pharmacy note TPN at goal rate will provide 1332 kcal and 60 grams of protein which will meet 100% of estimated energy and protein needs.  Height: Ht Readings from Last 1 Encounters:  07/18/13 5\' 4"  (1.626 m)    Weight Status:   Wt Readings from Last 1 Encounters:  07/29/13 113 lb 12.1 oz (51.6 kg)    Re-estimated needs:  Kcal: 1300-1500  Protein: 50-60 grams  Fluid: > 1.2 L  Skin: +2 RUE edema, +3 LUE edema; stage 1 pressure ulcer on sacrum, stage 1 pressure ulcer on vertebral column; abrasions on right leg and left leg; serous filled blister on right wrist  Diet Order: NPO   Intake/Output Summary (Last 24 hours) at 07/29/13 1348 Last data filed at 07/29/13 1007  Gross per 24 hour  Intake 780.05 ml  Output   1000 ml  Net -219.95 ml    Last BM: 7/19   Labs:   Recent Labs Lab 07/26/13 0430  07/27/13 0415 07/28/13 0415 07/29/13 0445  NA 139  < > 135  134* 135  K 2.6*  < > 4.4 3.5 3.0*  CL 107  --  106 103 103  CO2 25  --  23 23 23   BUN 22  --  21 18 14   CREATININE 0.78  --  0.81 0.80 0.74  CALCIUM 7.3*  --  7.5* 7.5* 7.8*  MG 2.5  --   --  2.1 2.1  PHOS 2.7  --   --  3.2 3.3  GLUCOSE 150*  < > 108* 100* 86  < > = values in this interval not displayed.  CBG (last 3)   Recent Labs  07/29/13 0009 07/29/13 0355 07/29/13 0745  GLUCAP 85 72 78    Scheduled Meds: . antiseptic oral rinse  15 mL Mouth Rinse QID  . aspirin  300 mg Rectal Daily  .  ceFAZolin (ANCEF) IV  2 g Intravenous Q8H  . chlorhexidine  15 mL Mouth/Throat BID  . famotidine (PEPCID) IV  20 mg Intravenous Q12H  . heparin  5,000 Units Subcutaneous Q8H  . insulin aspart  0-9 Units Subcutaneous Q8H  . metoprolol  2.5 mg Intravenous Q6H  . milk and molasses   Rectal Once  . mometasone-formoterol  2 puff Inhalation BID  . potassium chloride  10 mEq Intravenous Q1 Hr x 4  . sodium chloride  10-40 mL Intracatheter Q12H  . sodium  chloride  3 mL Intravenous Q12H  . triamcinolone cream  1 application Topical Weekly    Continuous Infusions: . amiodarone (NEXTERONE PREMIX) 360 mg/200 mL dextrose 30 mg/hr (07/29/13 1001)  . Marland KitchenTPN (CLINIMIX-E) Adult     And  . fat emulsion      Ariel Hunt RD, LDN Inpatient Clinical Dietitian Pager: [phone] After Hours Pager: [phone]

## 2013-07-29 NOTE — Progress Notes (Signed)
3 Days Post-Op  Subj ective: Stable and  Alert. Passing flatus, no stool. Voiding well. NG output low. Hungry, denies nausea. Continues to ambulate.  K 3.0. BUN 14. Albumin 1.8. Hgb 9.5.  Objective: Vital signs in last 24 hours: Temp:  [97.4 F (36.3 C)-98 F (36.7 C)] 98 F (36.7 C) (07/30 2204) Pulse Rate:  [58-64] 60 (07/30 2204) Resp:  [13-20] 18 (07/30 2204) BP: (120-138)/(57-84) 138/75 mmHg (07/30 2204) SpO2:  [97 %-100 %] 98 % (07/30 2204) Weight:  [113 lb 12.1 oz (51.6 kg)] 113 lb 12.1 oz (51.6 kg) (07/31 0500) Last BM Date: 07/17/13  Intake/Output from previous day: 07/30 0701 - 07/31 0700 In: 830 [I.V.:300; IV Piggyback:450] Out: 1050 [Urine:950; Emesis/NG output:100] Intake/Output this shift: Total I/O In: 166.3 [I.V.:66.3; IV Piggyback:100] Out: 825 [Urine:725; Emesis/NG output:100]  General appearance: alert GI: soft. non distended. wound clean hypoactive BS.  Lab Results:  Results for orders placed during the hospital encounter of 07/17/13 (from the past 24 hour(s))  GLUCOSE, CAPILLARY     Status: None   Collection Time    07/28/13  8:05 AM      Result Value Range   Glucose-Capillary 83  70 - 99 mg/dL   Comment 1 Documented in Chart     Comment 2 Notify RN    GLUCOSE, CAPILLARY     Status: None   Collection Time    07/28/13  4:05 PM      Result Value Range   Glucose-Capillary 99  70 - 99 mg/dL  GLUCOSE, CAPILLARY     Status: None   Collection Time    07/28/13  8:36 PM      Result Value Range   Glucose-Capillary 87  70 - 99 mg/dL   Comment 1 Notify RN    GLUCOSE, CAPILLARY     Status: None   Collection Time    07/29/13 12:09 AM      Result Value Range   Glucose-Capillary 85  70 - 99 mg/dL  GLUCOSE, CAPILLARY     Status: None   Collection Time    07/29/13  3:55 AM      Result Value Range   Glucose-Capillary 72  70 - 99 mg/dL   Comment 1 Notify RN    COMPREHENSIVE METABOLIC PANEL     Status: Abnormal   Collection Time    07/29/13  4:45 AM     Result Value Range   Sodium 135  135 - 145 mEq/L   Potassium 3.0 (*) 3.5 - 5.1 mEq/L   Chloride 103  96 - 112 mEq/L   CO2 23  19 - 32 mEq/L   Glucose, Bld 86  70 - 99 mg/dL   BUN 14  6 - 23 mg/dL   Creatinine, Ser 0.98  0.50 - 1.10 mg/dL   Calcium 7.8 (*) 8.4 - 10.5 mg/dL   Total Protein 4.5 (*) 6.0 - 8.3 g/dL   Albumin 1.8 (*) 3.5 - 5.2 g/dL   AST 12  0 - 37 U/L   ALT 16  0 - 35 U/L   Alkaline Phosphatase 45  39 - 117 U/L   Total Bilirubin 0.3  0.3 - 1.2 mg/dL   GFR calc non Af Amer 74 (*) >90 mL/min   GFR calc Af Amer 86 (*) >90 mL/min  MAGNESIUM     Status: None   Collection Time    07/29/13  4:45 AM      Result Value Range   Magnesium 2.1  1.5 - 2.5 mg/dL  PHOSPHORUS     Status: None   Collection Time    07/29/13  4:45 AM      Result Value Range   Phosphorus 3.3  2.3 - 4.6 mg/dL  CBC     Status: Abnormal   Collection Time    07/29/13  4:45 AM      Result Value Range   WBC 8.0  4.0 - 10.5 K/uL   RBC 3.72 (*) 3.87 - 5.11 MIL/uL   Hemoglobin 9.5 (*) 12.0 - 15.0 g/dL   HCT 16.1 (*) 09.6 - 04.5 %   MCV 77.4 (*) 78.0 - 100.0 fL   MCH 25.5 (*) 26.0 - 34.0 pg   MCHC 33.0  30.0 - 36.0 g/dL   RDW 40.9 (*) 81.1 - 91.4 %   Platelets 212  150 - 400 K/uL     Studies/Results: @RISRSLT24 @  . antiseptic oral rinse  15 mL Mouth Rinse QID  . aspirin  300 mg Rectal Daily  .  ceFAZolin (ANCEF) IV  2 g Intravenous Q8H  . chlorhexidine  15 mL Mouth/Throat BID  . famotidine (PEPCID) IV  20 mg Intravenous Q12H  . heparin  5,000 Units Subcutaneous Q8H  . hydrocortisone sod succinate (SOLU-CORTEF) inj  12.5 mg Intravenous Q12H  . insulin aspart  0-9 Units Subcutaneous Q8H  . metoprolol  2.5 mg Intravenous Q6H  . milk and molasses   Rectal Once  . mometasone-formoterol  2 puff Inhalation BID  . potassium chloride  10 mEq Intravenous Q1 Hr x 5  . sodium chloride  10-40 mL Intracatheter Q12H  . sodium chloride  3 mL Intravenous Q12H  . triamcinolone cream  1 application Topical  Weekly     Assessment/Plan: s/p Procedure(s): small bowel resection  POD #3. Laparotomy, lysis of adhesions,Small bowel resection  Stable  DC NG tube. Ice chips. Enema. mobilize out of bed.   ID. - On Vanc. and Zosyn per primary service. ID following. There is no indication for antibiotics from the standpoint of her bowel surgery.   Steroids -Unless there is a significant clinical indication for steroids, I would discontinue these because of their adverse affects on wound healing and immune competency.   Protein calorie malnutrition. Will need new IV access for TNA. Ordered.  Suspect LLL pneumonia.No increased work of breathing postop.CCM has signed off.   Moderate to severe aortic stenosis and cardiac arrhythmias, and recent NSTEMI per cardiology  Hypokalemia, I've ordered five KCl runs. Bacteremia, probably staph, PICC line removed.   @PROBHOSP @  LOS: 12 days    Ariel Hunt M 07/29/2013  . .prob

## 2013-07-30 DIAGNOSIS — E46 Unspecified protein-calorie malnutrition: Secondary | ICD-10-CM

## 2013-07-30 LAB — TYPE AND SCREEN
ABO/RH(D): A POS
Antibody Screen: NEGATIVE
Unit division: 0
Unit division: 0
Unit division: 0

## 2013-07-30 LAB — GLUCOSE, CAPILLARY: Glucose-Capillary: 97 mg/dL (ref 70–99)

## 2013-07-30 LAB — BASIC METABOLIC PANEL
Chloride: 103 mEq/L (ref 96–112)
GFR calc Af Amer: >90 mL/min (ref >90)
Potassium: 3.1 mEq/L — ABNORMAL LOW (ref 3.5–5.1)

## 2013-07-30 MED ORDER — BOOST / RESOURCE BREEZE PO LIQD
1.0000 | Freq: Three times a day (TID) | ORAL | Status: DC
  Administered 2013-07-30 – 2013-08-02 (×9): 1 via ORAL

## 2013-07-30 MED ORDER — ASPIRIN 325 MG PO TABS
325.0000 mg | ORAL_TABLET | Freq: Every day | ORAL | Status: DC
  Administered 2013-07-30 – 2013-08-02 (×4): 325 mg via ORAL
  Filled 2013-07-30 (×4): qty 1

## 2013-07-30 MED ORDER — METOPROLOL TARTRATE 12.5 MG HALF TABLET
12.5000 mg | ORAL_TABLET | Freq: Two times a day (BID) | ORAL | Status: DC
  Administered 2013-07-30 – 2013-08-02 (×6): 12.5 mg via ORAL
  Filled 2013-07-30 (×8): qty 1

## 2013-07-30 MED ORDER — POTASSIUM CHLORIDE 10 MEQ/100ML IV SOLN
10.0000 meq | INTRAVENOUS | Status: DC
  Filled 2013-07-30 (×6): qty 100

## 2013-07-30 MED ORDER — AMIODARONE HCL 200 MG PO TABS
200.0000 mg | ORAL_TABLET | Freq: Two times a day (BID) | ORAL | Status: DC
  Administered 2013-07-30 – 2013-08-02 (×7): 200 mg via ORAL
  Filled 2013-07-30 (×8): qty 1

## 2013-07-30 MED ORDER — POTASSIUM CHLORIDE CRYS ER 20 MEQ PO TBCR
60.0000 meq | EXTENDED_RELEASE_TABLET | Freq: Once | ORAL | Status: AC
  Administered 2013-07-30: 60 meq via ORAL
  Filled 2013-07-30: qty 3

## 2013-07-30 MED ORDER — FAMOTIDINE 20 MG PO TABS
20.0000 mg | ORAL_TABLET | Freq: Two times a day (BID) | ORAL | Status: DC
  Administered 2013-07-30 – 2013-08-02 (×7): 20 mg via ORAL
  Filled 2013-07-30 (×8): qty 1

## 2013-07-30 NOTE — Progress Notes (Signed)
[facility]  Patient Status:   New pt for [facility]  [facility] is providing the following services:  HH nursing and Infusion Pharmacy services for home IV ABX.  [facility] hospital coordinators will follow pt and support DC home over the weekend if needed.   If patient discharges after hours, please call [phone].   Sedalia Muta 07/30/2013, 1:28 PM

## 2013-07-30 NOTE — Progress Notes (Signed)
[facility] PROGRESS NOTE  Ariel Hunt ZOX:[id number] DOB: 02-10-1923 DOA: 07/17/2013 PCP: Daisy Floro, MD  Assessment/Plan: Brief narrative: 79 y.o. female with hx of mild to moderate aortic stenosis, last ECHO about a year ago, hx of CAD with cardiac stent, prior SBO felt to be secondary to adhesions, resolved with conservative treatments several times, hx of HTN, dyslipidemia, GERD, asthma, anemia of unclear etiology, presented to the ER with increased abdominal pain and distention for 2 days.  CT Abdomen/Pelvis showed SBO with transitional point in the small bowel, query luminal narrowing. [name] consulted surgery and hospitalist was asked to admit her for SBO and anemia. Despite conservative measures, SBO did not resolve. Patient was a high risk surgical candidate but since she did not resolve with conservative measures, after careful consideration and optimizing her medical care, she underwent surgery on 7/28. She developed cardiac arrhythmias (A. fib and NSVT) a few days back and was transferred to the step down unit and is on IV amiodarone with no further episodes. She also spiked fever secondary to staph bacteremia. Surgery, cardiology continue to see patient. ID following as well.  Assessment & plan:   1. SBO:  Patient was initially treated conservatively with bowel rest, NG tube decompression, IV fluids and mobilization but failed. Once her medical conditions were stabilized, she underwent Ex- lap, lysis of adhesions and small bowel resection on 7/28.        -had NG tube for ileus, output minimal, NG Dced 7/31       -diet per CCS: on full liq today       -DC TNA  2. Fever due to 3     -empiric Vanc/Zosyn stopped 7/30  3. MSSA bacteremia:suspected to be PICC related.  Infectious disease consulted , PICC line was removed on 7/28. Surveillance blood cultures x 2 on 7/29 neg to date.       -replaced PICC 7/31 since repeat cultures remain negative       -was initially on IV VAnc,  now on IV Ancef for 2 weeks  with repeat BCx when she has been off therapy at least 1 week.   4. Volume overload/? Acute systolic CHF: Patient's dyspnea improved after a dose of IV Lasix on 7/27.  -stopped IVF, DC TNA -ECHo with normal Ef and mod to severe AS  5.Hypokalemia/hyperkalemia/hypomagnesemia/hypocalcemia: - replete in TNA. Replete as needed and follow BMP.  6. Anemia: Patient has a history of chronic anemia.  -some worsening with acute illness -monitor and transfuse if <7  7. Arrhythmia(NSVT/SVT/Afib)/Hypotension: Overnight on 07/18/13, patient developed tachyarrhythmias, with V. Tach, SVT and A,fib with RVR. Dr Armanda Magic provided cardiology consultation, patient was managed with Amiodarone bolus, iv Lopressor and Amiodarone infusion, subsequently rapid A. Fib, followed by Hypotension and started on NeoSynephrine. NeoSynephrine was discontinued on 7/22 Cards following, change Amiodarone to PO and change IV lopressor to PO. -start PO low dose eloquis at DC  8. History of colitis:  Patient has known history of Collagenous colitis, on Entocort. This appears clinically stable at this time. Was on stress doses of steroids, will stop this and resume entercort when PO resumed . 9. CAD/Demand ischemia: Known history of CAD, S/P PCI/Stent. As described above, had bump in CEs. CEs have leveled off. Patient likely had demand ischemia. Heparin infusion DC'd 7/27. Cardiology following.  10. GERD: Asymptomatic. On PO Pepcid.  11. LE wounds: Patient has chronic healing LLE wound, WOC consulted and recommendations implemented.   12.Protein-Calorie malnutrition: was on TNA  Ambulate, Pt/Ot Foley and Aline removed, Ambulate PT/Ot eval  GI prophylaxis: IV Pepcid Activity: Up with assistance DVT prophylaxis: Lovenox   Code Status: Partial Family Communication: None today at bedside. Disposition Plan:  SNF early next week    Consultants:  General surgery.   Cardiology.  Critical  care medicine  Infectious disease  Procedures:  PICC line 7/25-7/28  Foley catheter  NG tube to low wall suction  Exploratory laparotomy, small bowel resection and lysis of adhesion on 7/28  PICC replaced 7/31  Antibiotics:  IV vancomycin 7/27 >7/30  IV Zosyn 7/27 >7/30  Ancef 7/30  HPI/Subjective:  Feels well, in good spirits, ambulating with RN  Objective: Vital signs in last 24 hours: Temp:  [97.5 F (36.4 C)-97.9 F (36.6 C)] 97.6 F (36.4 C) (08/01 0445) Pulse Rate:  [56-60] 56 (08/01 0445) Resp:  [16-18] 16 (08/01 0445) BP: (126-145)/(59-74) 139/59 mmHg (08/01 0445) SpO2:  [97 %-100 %] 98 % (08/01 0824) Weight:  [50.2 kg (110 lb 10.7 oz)] 50.2 kg (110 lb 10.7 oz) (08/01 0445) Weight change: -1.4 kg (-3 lb 1.4 oz) Last BM Date: 07/30/13  Intake/Output from previous day: 07/31 0701 - 08/01 0700 In: 1965.1 [I.V.:399.4; IV Piggyback:800; TPN:765.7] Out: 1004 [Urine:1000; Stool:4] Total I/O In: 120 [P.O.:120] Out: 800 [Urine:800]   Physical Exam: General: Pleasant and comfortable. Hard of hearing. Frail elderly female.  CHEST:   Reduced breath sounds in the bases. No increased work of breathing. HEART:  Sounds 1 and 2 heard, normal, regular. No JVD or pedal edema. 2/6 systolic ejection murmur at apex. Telemetry: Sinus rhythm in the 60s/70s. No further arrhythmias. ABDOMEN:  Abdomen not distended, soft, non-tender, no palpable organomegaly, no palpable masses. Few bowel sounds present. LOWER EXTREMITIES:  No pitting edema, palpable peripheral pulses. Has dressings on LLE.  CENTRAL NERVOUS SYSTEM:  No focal neurologic deficit on gross examination. Alert and oriented.  Lab Results:  Recent Labs  07/28/13 0415 07/29/13 0445  WBC 9.5 8.0  HGB 8.6* 9.5*  HCT 26.4* 28.8*  PLT 206 212    Recent Labs  07/29/13 0445 07/30/13 0405  NA 135 136  K 3.0* 3.1*  CL 103 103  CO2 23 25  GLUCOSE 86 106*  BUN 14 12  CREATININE 0.74 0.64  CALCIUM 7.8* 7.8*    Recent Results (from the past 240 hour(s))  CULTURE, BLOOD (ROUTINE X 2)     Status: None   Collection Time    07/25/13  8:15 AM      Result Value Range Status   Specimen Description BLOOD RIGHT ARM   Final   Special Requests BOTTLES DRAWN AEROBIC AND ANAEROBIC [facility] EACH   Final   Culture  Setup Time 07/25/2013 15:17   Final   Culture     Final   Value: STAPHYLOCOCCUS AUREUS     Note: SUSCEPTIBILITIES PERFORMED ON PREVIOUS CULTURE WITHIN THE LAST 5 DAYS.     Note: Gram Stain Report Called to,Read Back By and Verified With: Rita Ohara RN on 07/26/13 at 04:45 by Christie Nottingham   Report Status 07/28/2013 FINAL   Final  CULTURE, BLOOD (ROUTINE X 2)     Status: None   Collection Time    07/25/13  8:36 AM      Result Value Range Status   Specimen Description BLOOD RIGHT HAND   Final   Special Requests BOTTLES DRAWN AEROBIC AND ANAEROBIC [facility] EACH   Final   Culture  Setup Time 07/25/2013 15:17   Final  Culture     Final   Value: STAPHYLOCOCCUS AUREUS     Note: RIFAMPIN AND GENTAMICIN SHOULD NOT BE USED AS SINGLE DRUGS FOR TREATMENT OF STAPH INFECTIONS.     Note: Gram Stain Report Called to,Read Back By and Verified With: Rita Ohara RN on 07/26/13 at 04:45 by Christie Nottingham   Report Status 07/28/2013 FINAL   Final   Organism ID, Bacteria STAPHYLOCOCCUS AUREUS   Final  CULTURE, BLOOD (ROUTINE X 2)     Status: None   Collection Time    07/26/13  3:59 PM      Result Value Range Status   Specimen Description BLOOD LEFT ARM   Final   Special Requests BOTTLES DRAWN AEROBIC ONLY 6CC   Final   Culture  Setup Time 07/26/2013 21:17   Final   Culture     Final   Value:        BLOOD CULTURE RECEIVED NO GROWTH TO DATE CULTURE WILL BE HELD FOR 5 DAYS BEFORE ISSUING A FINAL NEGATIVE REPORT   Report Status PENDING   Incomplete  CULTURE, BLOOD (ROUTINE X 2)     Status: None   Collection Time    07/27/13  3:09 PM      Result Value Range Status   Specimen Description BLOOD RIGHT ARM   Final   Special Requests  BOTTLES DRAWN AEROBIC ONLY 1CC   Final   Culture  Setup Time 07/27/2013 23:52   Final   Culture     Final   Value:        BLOOD CULTURE RECEIVED NO GROWTH TO DATE CULTURE WILL BE HELD FOR 5 DAYS BEFORE ISSUING A FINAL NEGATIVE REPORT   Report Status PENDING   Incomplete     Studies/Results: Ir Fluoro Guide Cv Line Right  07/29/2013   *RADIOLOGY REPORT*  Clinical Data: Protein calorie malnutrition and poor venous access. PICC line requested.  Powered PICC LINE PLACEMENT WITH ULTRASOUND AND FLUOROSCOPIC GUIDANCE  Fluoroscopy Time: 12 seconds  The right arm was prepped with chlorhexidine, draped in the usual sterile fashion using maximum barrier technique (cap and mask, sterile gown, sterile gloves, large sterile sheet, hand hygiene and cutaneous antisepsis) and infiltrated locally with 1% Lidocaine.  Ultrasound demonstrated patency of the right basilic vein, and this was documented with an image.  Under real-time ultrasound guidance, this vein was accessed with a 21 gauge micropuncture needle and image documentation was performed.  The needle was exchanged over a guidewire for a peel-away sheath through which a five [place] dual lumen PICC trimmed to 43 cm was advanced, positioned with its tip at the lower SVC/right atrial junction.  Fluoroscopy during the procedure and fluoro spot radiograph confirms appropriate catheter position.  The catheter was flushed, secured to the skin with Prolene sutures, and covered with a sterile dressing.  Complications:  None  IMPRESSION: Successful right arm powered PICC line placement with ultrasound and fluoroscopic guidance.  The catheter is ready for use.  Read by Brayton El PA-C   Original Report Authenticated By: Tacey Ruiz, MD   Ir US Guide Vasc Access Right  07/29/2013   *RADIOLOGY REPORT*  Clinical Data: Protein calorie malnutrition and poor venous access. PICC line requested.  Powered PICC LINE PLACEMENT WITH ULTRASOUND AND FLUOROSCOPIC GUIDANCE  Fluoroscopy  Time: 12 seconds  The right arm was prepped with chlorhexidine, draped in the usual sterile fashion using maximum barrier technique (cap and mask, sterile gown, sterile gloves, large sterile sheet, hand hygiene and  cutaneous antisepsis) and infiltrated locally with 1% Lidocaine.  Ultrasound demonstrated patency of the right basilic vein, and this was documented with an image.  Under real-time ultrasound guidance, this vein was accessed with a 21 gauge micropuncture needle and image documentation was performed.  The needle was exchanged over a guidewire for a peel-away sheath through which a five [place] dual lumen PICC trimmed to 43 cm was advanced, positioned with its tip at the lower SVC/right atrial junction.  Fluoroscopy during the procedure and fluoro spot radiograph confirms appropriate catheter position.  The catheter was flushed, secured to the skin with Prolene sutures, and covered with a sterile dressing.  Complications:  None  IMPRESSION: Successful right arm powered PICC line placement with ultrasound and fluoroscopic guidance.  The catheter is ready for use.  Read by Brayton El PA-C   Original Report Authenticated By: Tacey Ruiz, MD    Medications: Scheduled Meds: . amiodarone  200 mg Oral BID  . antiseptic oral rinse  15 mL Mouth Rinse QID  . aspirin  325 mg Oral Daily  .  ceFAZolin (ANCEF) IV  2 g Intravenous Q8H  . chlorhexidine  15 mL Mouth/Throat BID  . famotidine  20 mg Oral BID  . feeding supplement  1 Container Oral TID BM  . heparin  5,000 Units Subcutaneous Q8H  . metoprolol tartrate  12.5 mg Oral BID  . mometasone-formoterol  2 puff Inhalation BID  . sodium chloride  10-40 mL Intracatheter Q12H  . sodium chloride  3 mL Intravenous Q12H  . triamcinolone cream  1 application Topical Weekly   Continuous Infusions:   PRN Meds:.acetaminophen, albuterol, bisacodyl, diphenhydrAMINE, hydrALAZINE, LORazepam, morphine injection, morphine injection, ondansetron (ZOFRAN) IV,  sodium chloride    LOS: 13 days   [facility]  [facility] Pager [phone]. If 8PM-8AM, please contact night-coverage at [email or web address], password [email or web address] 07/30/2013, 1:07 PM  LOS: 13 days

## 2013-07-30 NOTE — Clinical Social Work Psychosocial (Signed)
     Clinical Social Work Department BRIEF PSYCHOSOCIAL ASSESSMENT 07/30/2013  Patient:  Ariel Hunt, Ariel Hunt     Account Number:  [id number]     Admit date:  07/17/2013  Clinical Social Worker:  Hattie Perch  Date/Time:  07/30/2013 12:00 M  Referred by:  Physician  Date Referred:  07/30/2013 Referred for  SNF Placement   Other Referral:   Interview type:  Patient Other interview type:    PSYCHOSOCIAL DATA Living Status:  ALONE Admitted from facility:   Level of care:   Primary support name:   Primary support relationship to patient:   Degree of support available:   good    CURRENT CONCERNS Current Concerns  Post-Acute Placement   Other Concerns:    SOCIAL WORK ASSESSMENT / PLAN CSW met with patient. patient is alert and oriented X3.  Patient in need of snf placement. patient is pleasant and understanding of need for snf even though she preferred going home. patient has no snf preference at this time and is agreeable to being faxed out and recieving bed offers. patient is anxious over how long she will be at snf.   Assessment/plan status:   Other assessment/ plan:   Information/referral to community resources:    PATIENTS/FAMILYS RESPONSE TO PLAN OF CARE: patient is pleasant and agreeable to snf but is anxious over how long she may have to be there.

## 2013-07-30 NOTE — Progress Notes (Signed)
4 Days Post-Op  Subjective: No problems since NG tube removed. Had a good bowel movement. States she is hungry. Denies abdominal pain.  Potassium 3.1. BUN 12. Glucose 106.  Objective: Vital signs in last 24 hours: Temp:  [97.5 F (36.4 C)-97.9 F (36.6 C)] 97.6 F (36.4 C) (08/01 0445) Pulse Rate:  [56-60] 56 (08/01 0445) Resp:  [16-18] 16 (08/01 0445) BP: (126-145)/(59-74) 139/59 mmHg (08/01 0445) SpO2:  [97 %-100 %] 100 % (08/01 0445) Weight:  [110 lb 10.7 oz (50.2 kg)] 110 lb 10.7 oz (50.2 kg) (08/01 0445) Last BM Date: 07/17/13  Intake/Output from previous day: 07/31 0701 - 08/01 0700 In: 1000.7 [I.V.:350.7; IV Piggyback:650] Out: 1004 [Urine:1000; Stool:4] Intake/Output this shift: Total I/O In: 163.1 [I.V.:163.1] Out: 602 [Urine:600; Stool:2]  General appearance: alert.  Cooperative. In no distress. Elderly and deconditioned. GI: abdomen soft. Wound clean.  Hypoactive bowel sounds. Minimally distended, if at all.  Lab Results:  Results for orders placed during the hospital encounter of 07/17/13 (from the past 24 hour(s))  GLUCOSE, CAPILLARY     Status: None   Collection Time    07/29/13  7:45 AM      Result Value Range   Glucose-Capillary 78  70 - 99 mg/dL  GLUCOSE, CAPILLARY     Status: None   Collection Time    07/29/13  1:45 PM      Result Value Range   Glucose-Capillary 78  70 - 99 mg/dL  GLUCOSE, CAPILLARY     Status: None   Collection Time    07/29/13  4:03 PM      Result Value Range   Glucose-Capillary 76  70 - 99 mg/dL  GLUCOSE, CAPILLARY     Status: Abnormal   Collection Time    07/29/13  8:08 PM      Result Value Range   Glucose-Capillary 109 (*) 70 - 99 mg/dL  GLUCOSE, CAPILLARY     Status: Abnormal   Collection Time    07/29/13 11:49 PM      Result Value Range   Glucose-Capillary 131 (*) 70 - 99 mg/dL  BASIC METABOLIC PANEL     Status: Abnormal   Collection Time    07/30/13  4:05 AM      Result Value Range   Sodium 136  135 - 145 mEq/L    Potassium 3.1 (*) 3.5 - 5.1 mEq/L   Chloride 103  96 - 112 mEq/L   CO2 25  19 - 32 mEq/L   Glucose, Bld 106 (*) 70 - 99 mg/dL   BUN 12  6 - 23 mg/dL   Creatinine, Ser 1.61  0.50 - 1.10 mg/dL   Calcium 7.8 (*) 8.4 - 10.5 mg/dL   GFR calc non Af Amer 78 (*) >90 mL/min   GFR calc Af Amer >90  >90 mL/min  GLUCOSE, CAPILLARY     Status: None   Collection Time    07/30/13  4:22 AM      Result Value Range   Glucose-Capillary 97  70 - 99 mg/dL     Studies/Results: @RISRSLT24 @  . antiseptic oral rinse  15 mL Mouth Rinse QID  . aspirin  300 mg Rectal Daily  .  ceFAZolin (ANCEF) IV  2 g Intravenous Q8H  . chlorhexidine  15 mL Mouth/Throat BID  . famotidine (PEPCID) IV  20 mg Intravenous Q12H  . heparin  5,000 Units Subcutaneous Q8H  . insulin aspart  0-9 Units Subcutaneous Q8H  . metoprolol  2.5 mg  Intravenous Q6H  . mometasone-formoterol  2 puff Inhalation BID  . potassium chloride  10 mEq Intravenous Q1 Hr x 6  . sodium chloride  10-40 mL Intracatheter Q12H  . sodium chloride  3 mL Intravenous Q12H  . triamcinolone cream  1 application Topical Weekly     Assessment/Plan: s/p Procedure(s): small bowel resection  POD #4. Laparotomy, lysis of adhesions,Small bowel resection  Stable  Ileus resolving. Start full liquid diet  mobilize out of bed.   ID. - On Vanc. and Zosyn per primary service. ID following. There is no indication for antibiotics from the standpoint of her bowel surgery.   Steroids -  These have now been discontinued.  Protein calorie malnutrition  On TNA.  Marland Kitchen  Suspect LLL pneumonia.No increased work of breathing postop.CCM has signed off.   Moderate to severe aortic stenosis and cardiac arrhythmias, and recent NSTEMI per cardiology  Hypokalemia, I've ordered another six KCl runs. Slow to resolve.    @PROBHOSP @  LOS: 13 days    Kamika Goodloe M 07/30/2013  . .prob

## 2013-07-30 NOTE — Evaluation (Signed)
Physical Therapy Evaluation Patient Details Name: [name] MRN: [id number] DOB: 1923-07-02 Today's Date: 07/30/2013 Time:  -     PT Assessment / Plan / Recommendation History of Present Illness  Ariel Hunt is an 79 y.o. female with hx of mild to moderate aortic stenosis, last ECHO about a year ago, hx of CAD with cardiac stent, prior SBO felt to be because of adhesions, resolved with conservative treatments several times, hx of HTN, GERD, asthma, anemia of unclear etiology, presents to the ER with increase abdominal pain and distention for 2 days; Pt is s/p Exploratory Lap  on 7/28; due to SBO with small bowel resection  Clinical Impression  Pt will benefit from PT due to deficits below    PT Assessment  Patient needs continued PT services    Follow Up Recommendations  SNF    Does the patient have the potential to tolerate intense rehabilitation      Barriers to Discharge Decreased caregiver support      Equipment Recommendations  None recommended by PT    Recommendations for Other Services     Frequency Min 3X/week    Precautions / Restrictions Precautions Precautions: Fall   Pertinent Vitals/Pain C/o right hip/buttock pain, RN gave meds      Mobility  Bed Mobility Bed Mobility: Supine to Sit Supine to Sit: 4: Min assist Details for Bed Mobility Assistance: cues for self assist, min with trunk Transfers Transfers: Sit to Stand;Stand to Sit Sit to Stand: 4: Min guard;4: Min assist (x2 for dressing removal by RN) Stand to Sit: 4: Min assist;4: Min guard;To chair/3-in-1 Details for Transfer Assistance: assist with wt shift and balance, cues for hand placement Ambulation/Gait Ambulation/Gait Assistance: 4: Min guard Ambulation Distance (Feet): 40 Feet Assistive device: Rolling walker Ambulation/Gait Assistance Details: cues for  RW safety Gait Pattern: Step-through pattern;Trunk flexed    Exercises     PT Diagnosis: Difficulty walking;Generalized weakness   PT Problem List: Decreased range of motion;Decreased strength;Decreased activity tolerance;Decreased balance;Decreased mobility;Decreased knowledge of use of DME PT Treatment Interventions: DME instruction;Gait training;Stair training;Functional mobility training;Therapeutic activities;Patient/family education;Therapeutic exercise     PT Goals(Current goals can be found in the care plan section) Acute Rehab PT Goals Patient Stated Goal: go to rehab PT Goal Formulation: With patient Time For Goal Achievement: 08/13/13 Potential to Achieve Goals: Good  Visit Information  Last PT Received On: 07/30/13 Assistance Needed: +1 History of Present Illness: Ariel Hunt is an 78 y.o. female with hx of mild to moderate aortic stenosis, last ECHO about a year ago, hx of CAD with cardiac stent, prior SBO felt to be because of adhesions, resolved with conservative treatments several times, hx of HTN, GERD, asthma, anemia of unclear etiology, presents to the ER with increase abdominal pain and distention for 2 days; Pt is s/p Exploratory Lap  on 7/28; due to SBO with small bowel resection       Prior Functioning  Home Living Family/patient expects to be discharged to:: Skilled nursing facility Living Arrangements: Alone Prior Function Level of Independence: Independent with assistive device(s) Communication Communication: No difficulties    Cognition  Cognition Arousal/Alertness: Awake/alert Behavior During Therapy: WFL for tasks assessed/performed Overall Cognitive Status: Within Functional Limits for tasks assessed    Extremity/Trunk Assessment Lower Extremity Assessment Lower Extremity Assessment: Generalized weakness   Balance Static Sitting Balance Static Sitting - Balance Support: No upper extremity supported;Feet supported Static Sitting - Level of Assistance: 5: Stand by assistance Static Standing Balance  Static Standing - Balance Support: No upper extremity supported Static  Standing - Level of Assistance: 5: Stand by assistance;4: Min assist  End of Session PT - End of Session Equipment Utilized During Treatment: Gait belt Activity Tolerance: Patient limited by fatigue;Patient limited by pain Patient left: in chair;with call bell/phone within reach Nurse Communication: Mobility status  GP     [facility] 07/30/2013, 3:37 PM

## 2013-07-31 LAB — BASIC METABOLIC PANEL
BUN: 10 mg/dL (ref 6–23)
Calcium: 7.4 mg/dL — ABNORMAL LOW (ref 8.4–10.5)
GFR calc non Af Amer: 77 mL/min — ABNORMAL LOW (ref >90)
Glucose, Bld: 97 mg/dL (ref 70–99)
Sodium: 133 mEq/L — ABNORMAL LOW (ref 135–145)

## 2013-07-31 LAB — GLUCOSE, CAPILLARY: Glucose-Capillary: 78 mg/dL (ref 70–99)

## 2013-07-31 MED ORDER — POTASSIUM CHLORIDE CRYS ER 20 MEQ PO TBCR
40.0000 meq | EXTENDED_RELEASE_TABLET | Freq: Two times a day (BID) | ORAL | Status: DC
  Administered 2013-07-31 – 2013-08-02 (×5): 40 meq via ORAL
  Filled 2013-07-31 (×6): qty 2

## 2013-07-31 NOTE — Progress Notes (Addendum)
[facility] PROGRESS NOTE  Ariel Hunt DOB: October 01, 1923 DOA: 07/17/2013 PCP: Daisy Floro, MD  Assessment/Plan: Brief narrative: 79 y.o. female with hx of mild to moderate aortic stenosis, last ECHO about a year ago, hx of CAD with cardiac stent, prior SBO felt to be secondary to adhesions, resolved with conservative treatments several times, hx of HTN, dyslipidemia, GERD, asthma, anemia of unclear etiology, presented to the ER with increased abdominal pain and distention for 2 days.  CT Abdomen/Pelvis showed SBO with transitional point in the small bowel, query luminal narrowing. [name] consulted surgery and hospitalist was asked to admit her for SBO and anemia. Despite conservative measures, SBO did not resolve. Patient was a high risk surgical candidate but since she did not resolve with conservative measures, after careful consideration and optimizing her medical care, she underwent surgery on 7/28. She developed cardiac arrhythmias (A. fib and NSVT) a few days back and was transferred to the step down unit and is on IV amiodarone with no further episodes. She also spiked fever secondary to staph bacteremia. Surgery, cardiology continue to see patient. ID following as well.  Assessment & plan:   1. SBO:  Patient was initially treated conservatively with bowel rest, NG tube decompression, IV fluids and mobilization but failed. Once her medical conditions were stabilized, she underwent Ex- lap, lysis of adhesions and small bowel resection on 7/28.        -had NG tube for ileus, output minimal, NG Dced 7/31       -diet per CCS: on full liq today, advance to bland diet       -DC TNA  2. Fever due to 3     -empiric Vanc/Zosyn stopped 7/30  3. MSSA bacteremia:suspected to be PICC related.  Infectious disease consulted , PICC line was removed on 7/28. Surveillance blood cultures x 2 on 7/29 neg to date.       -replaced PICC 7/31 since repeat cultures remain negative        -was initially on IV VAnc, now on IV Ancef for 2 weeks till 8/11  with repeat BCx when she has been off therapy at least 1 week.   4. Volume overload/? Acute systolic CHF: Patient's dyspnea improved after a dose of IV Lasix on 7/27.  -stopped IVF, DC TNA -ECHo with normal Ef and mod to severe AS  5.Hypokalemia/hyperkalemia/hypomagnesemia/hypocalcemia: - replete in TNA. Replete as needed and follow BMP.  6. Anemia: Patient has a history of chronic anemia.  -some worsening with acute illness -monitor and transfuse if <7  7. Arrhythmia(NSVT/SVT/Afib)/Hypotension: Overnight on 07/18/13, patient developed tachyarrhythmias, with V. Tach, SVT and A,fib with RVR. Dr Armanda Magic provided cardiology consultation, patient was managed with Amiodarone bolus, iv Lopressor and Amiodarone infusion, subsequently rapid A. Fib, followed by Hypotension and started on NeoSynephrine. NeoSynephrine was discontinued on 7/22 Cards following, change Amiodarone to PO and change IV lopressor to PO. -start PO low dose eloquis at DC  8. History of colitis:  Patient has known history of Collagenous colitis, on Entocort. This appears clinically stable at this time. Was on stress doses of steroids, will stop this and resume entercort when PO resumed . 9. CAD/Demand ischemia: Known history of CAD, S/P PCI/Stent. As described above, had bump in CEs. CEs have leveled off. Patient likely had demand ischemia. Heparin infusion DC'd 7/27. Cardiology following.  10. GERD: Asymptomatic. On PO Pepcid.  11. LE wounds: Patient has chronic healing LLE wound, WOC consulted and recommendations implemented.  12.Protein-Calorie malnutrition: was on TNA  Ambulate, Pt/Ot Foley and Aline removed, Ambulate PT/Ot eval  GI prophylaxis:  Pepcid Activity: Up with assistance DVT prophylaxis: Lovenox   Code Status: Partial Family Communication: None today at bedside. Disposition Plan:  SNF early next week    Consultants:  General  surgery.   Cardiology.  Critical care medicine  Infectious disease  Procedures:  PICC line 7/25-7/28  Foley catheter  NG tube to low wall suction  Exploratory laparotomy, small bowel resection and lysis of adhesion on 7/28  PICC replaced 7/31  Antibiotics:  IV vancomycin 7/27 >7/30  IV Zosyn 7/27 >7/30  Ancef 7/30  HPI/Subjective:  Feels well, in good spirits, ambulating with RN  Objective: Vital signs in last 24 hours: Temp:  [97.7 F (36.5 C)-97.8 F (36.6 C)] 97.7 F (36.5 C) (08/02 1441) Pulse Rate:  [50-70] 70 (08/02 1441) Resp:  [16-18] 18 (08/02 1441) BP: (109-130)/(60-73) 129/73 mmHg (08/02 1441) SpO2:  [97 %-100 %] 100 % (08/02 1441) Weight:  [51.9 kg (114 lb 6.7 oz)] 51.9 kg (114 lb 6.7 oz) (08/02 0504) Weight change: 1.7 kg (3 lb 12 oz) Last BM Date: 07/31/13  Intake/Output from previous day: 08/01 0701 - 08/02 0700 In: 1032.4 [P.O.:480; I.V.:93.2; IV Piggyback:100; TPN:279.2] Out: 800 [Urine:800] Total I/O In: 720 [P.O.:720] Out: -    Physical Exam: General: Pleasant and comfortable. Hard of hearing. Frail elderly female.  CHEST:   Reduced breath sounds in the bases. No increased work of breathing. HEART:  Sounds 1 and 2 heard, normal, regular. No JVD or pedal edema. 2/6 systolic ejection murmur at apex. Telemetry: Sinus rhythm in the 60s/70s. No further arrhythmias. ABDOMEN:  Abdomen not distended, soft, non-tender, no palpable organomegaly, no palpable masses. Few bowel sounds present. LOWER EXTREMITIES:  No pitting edema, palpable peripheral pulses. Has dressings on LLE.  CENTRAL NERVOUS SYSTEM:  No focal neurologic deficit on gross examination. Alert and oriented.  Lab Results:  Recent Labs  07/29/13 0445  WBC 8.0  HGB 9.5*  HCT 28.8*  PLT 212    Recent Labs  07/30/13 0405 07/31/13 0355  NA 136 133*  K 3.1* 3.3*  CL 103 104  CO2 25 24  GLUCOSE 106* 97  BUN 12 10  CREATININE 0.64 0.66  CALCIUM 7.8* 7.4*   Recent  Results (from the past 240 hour(s))  CULTURE, BLOOD (ROUTINE X 2)     Status: None   Collection Time    07/25/13  8:15 AM      Result Value Range Status   Specimen Description BLOOD RIGHT ARM   Final   Special Requests BOTTLES DRAWN AEROBIC AND ANAEROBIC [facility] EACH   Final   Culture  Setup Time 07/25/2013 15:17   Final   Culture     Final   Value: STAPHYLOCOCCUS AUREUS     Note: SUSCEPTIBILITIES PERFORMED ON PREVIOUS CULTURE WITHIN THE LAST 5 DAYS.     Note: Gram Stain Report Called to,Read Back By and Verified With: Rita Ohara RN on 07/26/13 at 04:45 by Christie Nottingham   Report Status 07/28/2013 FINAL   Final  CULTURE, BLOOD (ROUTINE X 2)     Status: None   Collection Time    07/25/13  8:36 AM      Result Value Range Status   Specimen Description BLOOD RIGHT HAND   Final   Special Requests BOTTLES DRAWN AEROBIC AND ANAEROBIC [facility] EACH   Final   Culture  Setup Time 07/25/2013 15:17   Final  Culture     Final   Value: STAPHYLOCOCCUS AUREUS     Note: RIFAMPIN AND GENTAMICIN SHOULD NOT BE USED AS SINGLE DRUGS FOR TREATMENT OF STAPH INFECTIONS.     Note: Gram Stain Report Called to,Read Back By and Verified With: Rita Ohara RN on 07/26/13 at 04:45 by Christie Nottingham   Report Status 07/28/2013 FINAL   Final   Organism ID, Bacteria STAPHYLOCOCCUS AUREUS   Final  CULTURE, BLOOD (ROUTINE X 2)     Status: None   Collection Time    07/26/13  3:59 PM      Result Value Range Status   Specimen Description BLOOD LEFT ARM   Final   Special Requests BOTTLES DRAWN AEROBIC ONLY 6CC   Final   Culture  Setup Time 07/26/2013 21:17   Final   Culture     Final   Value:        BLOOD CULTURE RECEIVED NO GROWTH TO DATE CULTURE WILL BE HELD FOR 5 DAYS BEFORE ISSUING A FINAL NEGATIVE REPORT   Report Status PENDING   Incomplete  CULTURE, BLOOD (ROUTINE X 2)     Status: None   Collection Time    07/27/13  3:09 PM      Result Value Range Status   Specimen Description BLOOD RIGHT ARM   Final   Special Requests BOTTLES  DRAWN AEROBIC ONLY 1CC   Final   Culture  Setup Time 07/27/2013 23:52   Final   Culture     Final   Value:        BLOOD CULTURE RECEIVED NO GROWTH TO DATE CULTURE WILL BE HELD FOR 5 DAYS BEFORE ISSUING A FINAL NEGATIVE REPORT   Report Status PENDING   Incomplete     Studies/Results: No results found.  Medications: Scheduled Meds: . amiodarone  200 mg Oral BID  . antiseptic oral rinse  15 mL Mouth Rinse QID  . aspirin  325 mg Oral Daily  .  ceFAZolin (ANCEF) IV  2 g Intravenous Q8H  . chlorhexidine  15 mL Mouth/Throat BID  . famotidine  20 mg Oral BID  . feeding supplement  1 Container Oral TID BM  . heparin  5,000 Units Subcutaneous Q8H  . metoprolol tartrate  12.5 mg Oral BID  . mometasone-formoterol  2 puff Inhalation BID  . potassium chloride  40 mEq Oral BID  . sodium chloride  10-40 mL Intracatheter Q12H  . sodium chloride  3 mL Intravenous Q12H  . triamcinolone cream  1 application Topical Weekly   Continuous Infusions:   PRN Meds:.acetaminophen, albuterol, bisacodyl, diphenhydrAMINE, hydrALAZINE, LORazepam, morphine injection, morphine injection, ondansetron (ZOFRAN) IV, sodium chloride    LOS: 14 days   [facility]  [facility] Pager [phone]. If 8PM-8AM, please contact night-coverage at [email or web address], password [facility] 07/31/2013, 5:58 PM  LOS: 14 days

## 2013-07-31 NOTE — Progress Notes (Signed)
Subjective:  Comfortable in bed. No SOB. No CP.   Objective:  Vital Signs in the last 24 hours: Temp:  [97.6 F (36.4 C)-97.8 F (36.6 C)] 97.7 F (36.5 C) (08/02 0504) Pulse Rate:  [59-65] 59 (08/02 0504) Resp:  [16-18] 16 (08/02 0504) BP: (109-130)/(51-62) 109/60 mmHg (08/02 0504) SpO2:  [97 %-99 %] 99 % (08/02 0504) Weight:  [114 lb 6.7 oz (51.9 kg)] 114 lb 6.7 oz (51.9 kg) (08/02 0504)  Intake/Output from previous day: 08/01 0701 - 08/02 0700 In: 1032.4 [P.O.:480; I.V.:93.2; IV Piggyback:100; TPN:279.2] Out: 800 [Urine:800]   Physical Exam: General: Frail, elderly, in no acute distress.  Head: Normocephalic and atraumatic.  Lungs: CTA  Heart: Normal S1 and S2. 2/6 systolic murmur, no rubs or gallops.  Abdomen: s/p abdominal surgery Extremities: No edema. Neurologic: Alert and oriented x 3.     Lab Results:  Recent Labs  07/29/13 0445  WBC 8.0  HGB 9.5*  PLT 212    Recent Labs  07/30/13 0405 07/31/13 0355  NA 136 133*  K 3.1* 3.3*  CL 103 104  CO2 25 24  GLUCOSE 106* 97  BUN 12 10  CREATININE 0.64 0.66   Hepatic Function Panel  Recent Labs  07/29/13 0445  PROT 4.5*  ALBUMIN 1.8*  AST 12  ALT 16  ALKPHOS 45  BILITOT 0.3   Telemetry: NSR with PVCs Cardiac Studies:  EF 45%, mod to severe AS  Assessment/Plan:    1) AFIB  - maintaining NSR on AMIO, metop  - not felt to be an anticoagulation candidate at this point. Continue ASA  - If she continues to improve, may reconsider anticoagulation (perhaps low dose Eliquis 2.5mg  PO BID). Will leave to Dr Anne Fu.   2) NSTEMI  3) VT  - resolved  4) CAD  - med mgt  5) AS  - med mgt  6) SBO  - post op.   - ambulating  7) Hypokalemia - supplement.   Olga Millers 07/31/2013, 8:12 AM

## 2013-07-31 NOTE — Progress Notes (Signed)
Provided Pt and daughter with bed offers.  Answered questions and provided Pt's daughter with the names of the facilities that are close to Pt's residence, as requested by Pt.  Providence Crosby, LCSWA Clinical Social Work [phone]

## 2013-07-31 NOTE — Progress Notes (Signed)
5 Days Post-Op  Subjective: Feeling better. Bowels have started moving  Objective: Vital signs in last 24 hours: Temp:  [97.6 F (36.4 C)-97.8 F (36.6 C)] 97.7 F (36.5 C) (08/02 0504) Pulse Rate:  [59-65] 59 (08/02 0504) Resp:  [16-18] 16 (08/02 0504) BP: (109-130)/(51-62) 109/60 mmHg (08/02 0504) SpO2:  [97 %-99 %] 99 % (08/02 0504) Weight:  [114 lb 6.7 oz (51.9 kg)] 114 lb 6.7 oz (51.9 kg) (08/02 0504) Last BM Date: 07/30/13  Intake/Output from previous day: 08/01 0701 - 08/02 0700 In: 1032.4 [P.O.:480; I.V.:93.2; IV Piggyback:100; TPN:279.2] Out: 800 [Urine:800] Intake/Output this shift: Total I/O In: 240 [P.O.:240] Out: -   GI: soft, minimal tenderness. good bs. incision looks good  Lab Results:   Recent Labs  07/29/13 0445  WBC 8.0  HGB 9.5*  HCT 28.8*  PLT 212   BMET  Recent Labs  07/30/13 0405 07/31/13 0355  NA 136 133*  K 3.1* 3.3*  CL 103 104  CO2 25 24  GLUCOSE 106* 97  BUN 12 10  CREATININE 0.64 0.66  CALCIUM 7.8* 7.4*   PT/INR No results found for this basename: LABPROT, INR,  in the last 72 hours ABG No results found for this basename: PHART, PCO2, PO2, HCO3,  in the last 72 hours  Studies/Results: Ir Fluoro Guide Cv Line Right  07/29/2013   *RADIOLOGY REPORT*  Clinical Data: Protein calorie malnutrition and poor venous access. PICC line requested.  Powered PICC LINE PLACEMENT WITH ULTRASOUND AND FLUOROSCOPIC GUIDANCE  Fluoroscopy Time: 12 seconds  The right arm was prepped with chlorhexidine, draped in the usual sterile fashion using maximum barrier technique (cap and mask, sterile gown, sterile gloves, large sterile sheet, hand hygiene and cutaneous antisepsis) and infiltrated locally with 1% Lidocaine.  Ultrasound demonstrated patency of the right basilic vein, and this was documented with an image.  Under real-time ultrasound guidance, this vein was accessed with a 21 gauge micropuncture needle and image documentation was performed.  The  needle was exchanged over a guidewire for a peel-away sheath through which a five [place] dual lumen PICC trimmed to 43 cm was advanced, positioned with its tip at the lower SVC/right atrial junction.  Fluoroscopy during the procedure and fluoro spot radiograph confirms appropriate catheter position.  The catheter was flushed, secured to the skin with Prolene sutures, and covered with a sterile dressing.  Complications:  None  IMPRESSION: Successful right arm powered PICC line placement with ultrasound and fluoroscopic guidance.  The catheter is ready for use.  Read by Brayton El PA-C   Original Report Authenticated By: Tacey Ruiz, MD   Ir US Guide Vasc Access Right  07/29/2013   *RADIOLOGY REPORT*  Clinical Data: Protein calorie malnutrition and poor venous access. PICC line requested.  Powered PICC LINE PLACEMENT WITH ULTRASOUND AND FLUOROSCOPIC GUIDANCE  Fluoroscopy Time: 12 seconds  The right arm was prepped with chlorhexidine, draped in the usual sterile fashion using maximum barrier technique (cap and mask, sterile gown, sterile gloves, large sterile sheet, hand hygiene and cutaneous antisepsis) and infiltrated locally with 1% Lidocaine.  Ultrasound demonstrated patency of the right basilic vein, and this was documented with an image.  Under real-time ultrasound guidance, this vein was accessed with a 21 gauge micropuncture needle and image documentation was performed.  The needle was exchanged over a guidewire for a peel-away sheath through which a five [place] dual lumen PICC trimmed to 43 cm was advanced, positioned with its tip at the lower SVC/right atrial  junction.  Fluoroscopy during the procedure and fluoro spot radiograph confirms appropriate catheter position.  The catheter was flushed, secured to the skin with Prolene sutures, and covered with a sterile dressing.  Complications:  None  IMPRESSION: Successful right arm powered PICC line placement with ultrasound and fluoroscopic guidance.  The  catheter is ready for use.  Read by Brayton El PA-C   Original Report Authenticated By: Tacey Ruiz, MD    Anti-infectives: Anti-infectives   Start     Dose/Rate Route Frequency Ordered Stop   07/28/13 1800  ceFAZolin (ANCEF) IVPB 2 g/50 mL premix     2 g 100 mL/hr over 30 Minutes Intravenous 3 times per day 07/28/13 1620     07/26/13 0932  cefOXitin (MEFOXIN) 2 g in dextrose 5 % 50 mL IVPB  Status:  Discontinued     2 g 100 mL/hr over 30 Minutes Intravenous 30 min pre-op 07/26/13 0925 07/26/13 1415   07/26/13 0900  vancomycin (VANCOCIN) IVPB 1000 mg/200 mL premix  Status:  Discontinued     1,000 mg 200 mL/hr over 60 Minutes Intravenous Every 24 hours 07/26/13 0827 07/28/13 1620   07/25/13 1000  vancomycin (VANCOCIN) IVPB 750 mg/150 ml premix  Status:  Discontinued     750 mg 150 mL/hr over 60 Minutes Intravenous Every 24 hours 07/25/13 0836 07/26/13 0827   07/25/13 1000  piperacillin-tazobactam (ZOSYN) IVPB 3.375 g  Status:  Discontinued     3.375 g 12.5 mL/hr over 240 Minutes Intravenous Every 8 hours 07/25/13 0836 07/28/13 1620      Assessment/Plan: s/p Procedure(s): small bowel resection (N/A) Advance diet Physical Therapy  LOS: 14 days    TOTH III,PAUL S 07/31/2013

## 2013-08-01 LAB — BASIC METABOLIC PANEL
BUN: 9 mg/dL (ref 6–23)
CO2: 23 mEq/L (ref 19–32)
Chloride: 107 mEq/L (ref 96–112)
Creatinine, Ser: 0.67 mg/dL (ref 0.50–1.10)

## 2013-08-01 LAB — CULTURE, BLOOD (ROUTINE X 2)

## 2013-08-01 NOTE — Progress Notes (Signed)
[facility] PROGRESS NOTE  Ariel Hunt ZOX:[id number] DOB: 1923-02-01 DOA: 07/17/2013 PCP: Daisy Floro, MD  Assessment/Plan: Brief narrative: 79 y.o. female with hx of mild to moderate aortic stenosis, last ECHO about a year ago, hx of CAD with cardiac stent, prior SBO felt to be secondary to adhesions, resolved with conservative treatments several times, hx of HTN, dyslipidemia, GERD, asthma, anemia of unclear etiology, presented to the ER with increased abdominal pain and distention for 2 days.  CT Abdomen/Pelvis showed SBO with transitional point in the small bowel, query luminal narrowing. [name] consulted surgery and hospitalist was asked to admit her for SBO and anemia. Despite conservative measures, SBO did not resolve. Patient was a high risk surgical candidate but since she did not resolve with conservative measures, after careful consideration and optimizing her medical care, she underwent surgery on 7/28. She developed cardiac arrhythmias (A. fib and NSVT) a few days back and was transferred to the step down unit and is on IV amiodarone with no further episodes. She also spiked fever secondary to staph bacteremia. Surgery, cardiology continue to see patient. ID following as well.  Assessment & plan:   1. SBO:  Patient was initially treated conservatively with bowel rest, NG tube decompression, IV fluids and mobilization but failed. Once her medical conditions were stabilized, she underwent Ex- lap, lysis of adhesions and small bowel resection on 7/28.        -had NG tube for ileus, output minimal, NG Dced 7/31       -diet per [facility]: advanced to bland diet       -off TNA  2. Fever due to 3     -empiric Vanc/Zosyn stopped 7/30  3. MSSA bacteremia:suspected to be PICC related.  Infectious disease consulted , PICC line was removed on 7/28. Surveillance blood cultures x 2 on 7/29 neg to date.       -replaced PICC 7/31 since repeat cultures remain negative       -was initially on IV  VAnc, now on IV Ancef for 2 weeks till 8/11  with repeat BCx when she has been off therapy at least 1 week.   4. Volume overload/? Acute systolic CHF: Patient's dyspnea improved after a dose of IV Lasix on 7/27.  -stopped IVF, DC TNA -ECHo with normal Ef and mod to severe AS  5.Hypokalemia/hyperkalemia/hypomagnesemia/hypocalcemia: - replete in TNA. Replete as needed and follow BMP.  6. Anemia: Patient has a history of chronic anemia.  -some worsening with acute illness -monitor and transfuse if <7  7. Arrhythmia(NSVT/SVT/Afib)/Hypotension: Overnight on 07/18/13, patient developed tachyarrhythmias, with V. Tach, SVT and A,fib with RVR. Dr Armanda Magic provided cardiology consultation, patient was managed with Amiodarone bolus, iv Lopressor and Amiodarone infusion, subsequently rapid A. Fib, followed by Hypotension and started on NeoSynephrine. NeoSynephrine was discontinued on 7/22 Cards following, change Amiodarone to PO and change IV lopressor to PO. -start PO low dose eloquis at DC  8. History of colitis:  Patient has known history of Collagenous colitis, on Entocort. This appears clinically stable at this time. Was on stress doses of steroids, which was stopped . 9. CAD/Demand ischemia: Known history of CAD, S/P PCI/Stent. As described above, had bump in CEs. CEs have leveled off. Patient likely had demand ischemia. Heparin infusion DC'd 7/27. Cardiology following.  10. GERD: Asymptomatic. On PO Pepcid.  11. LE wounds: Patient has chronic healing LLE wound, WOC consulted and recommendations implemented.   12.Protein-Calorie malnutrition: was on TNA  Ambulate, Pt/Ot Foley and  Aline removed, Ambulate PT/Ot eval  GI prophylaxis:  Pepcid Activity: Up with assistance DVT prophylaxis: Lovenox   Code Status: Partial Family Communication: None today at bedside. Disposition Plan:  SNF Tomorrow  Consultants:  General surgery.   Cardiology.  Critical care medicine  Infectious  disease  Procedures:  PICC line 7/25-7/28  Foley catheter  NG tube to low wall suction  Exploratory laparotomy, small bowel resection and lysis of adhesion on 7/28  PICC replaced 7/31  Antibiotics:  IV vancomycin 7/27 >7/30  IV Zosyn 7/27 >7/30  Ancef 7/30  HPI/Subjective:  Feels well, in good spirits, ambulating with RN, having BMs and tolerating diet  Objective: Vital signs in last 24 hours: Temp:  [97.6 F (36.4 C)-97.7 F (36.5 C)] 97.6 F (36.4 C) (08/03 1444) Pulse Rate:  [63-80] 66 (08/03 1444) Resp:  [16-20] 20 (08/03 1444) BP: (113-129)/(55-69) 129/55 mmHg (08/03 1444) SpO2:  [95 %-100 %] 100 % (08/03 1444) Weight:  [49.6 kg (109 lb 5.6 oz)] 49.6 kg (109 lb 5.6 oz) (08/03 0458) Weight change: -2.3 kg (-5 lb 1.1 oz) Last BM Date: 08/01/13  Intake/Output from previous day: 08/02 0701 - 08/03 0700 In: 1120 [P.O.:870; IV Piggyback:250] Out: -  Total I/O In: 480 [P.O.:480] Out: -    Physical Exam: General: Pleasant and comfortable. Hard of hearing. Frail elderly female.  CHEST:   Reduced breath sounds in the bases. No increased work of breathing. HEART:  Sounds 1 and 2 heard, normal, regular. No JVD or pedal edema. 2/6 systolic ejection murmur at apex. Telemetry: Sinus rhythm in the 60s/70s. No further arrhythmias. ABDOMEN:  Abdomen not distended, soft, non-tender, no palpable organomegaly, no palpable masses. Few bowel sounds present. LOWER EXTREMITIES:  No pitting edema, palpable peripheral pulses. Has dressings on LLE.  CENTRAL NERVOUS SYSTEM:  No focal neurologic deficit on gross examination. Alert and oriented.  Lab Results: No results found for this basename: WBC, HGB, HCT, PLT,  in the last 72 hours  Recent Labs  07/31/13 0355 08/01/13 0450  NA 133* 136  K 3.3* 3.9  CL 104 107  CO2 24 23  GLUCOSE 97 82  BUN 10 9  CREATININE 0.66 0.67  CALCIUM 7.4* 7.7*   Recent Results (from the past 240 hour(s))  CULTURE, BLOOD (ROUTINE X 2)      Status: None   Collection Time    07/25/13  8:15 AM      Result Value Range Status   Specimen Description BLOOD RIGHT ARM   Final   Special Requests BOTTLES DRAWN AEROBIC AND ANAEROBIC [facility] EACH   Final   Culture  Setup Time 07/25/2013 15:17   Final   Culture     Final   Value: STAPHYLOCOCCUS AUREUS     Note: SUSCEPTIBILITIES PERFORMED ON PREVIOUS CULTURE WITHIN THE LAST 5 DAYS.     Note: Gram Stain Report Called to,Read Back By and Verified With: Rita Ohara RN on 07/26/13 at 04:45 by Christie Nottingham   Report Status 07/28/2013 FINAL   Final  CULTURE, BLOOD (ROUTINE X 2)     Status: None   Collection Time    07/25/13  8:36 AM      Result Value Range Status   Specimen Description BLOOD RIGHT HAND   Final   Special Requests BOTTLES DRAWN AEROBIC AND ANAEROBIC [facility] EACH   Final   Culture  Setup Time 07/25/2013 15:17   Final   Culture     Final   Value: STAPHYLOCOCCUS AUREUS  Note: RIFAMPIN AND GENTAMICIN SHOULD NOT BE USED AS SINGLE DRUGS FOR TREATMENT OF STAPH INFECTIONS.     Note: Gram Stain Report Called to,Read Back By and Verified With: Rita Ohara RN on 07/26/13 at 04:45 by Christie Nottingham   Report Status 07/28/2013 FINAL   Final   Organism ID, Bacteria STAPHYLOCOCCUS AUREUS   Final  CULTURE, BLOOD (ROUTINE X 2)     Status: None   Collection Time    07/26/13  3:59 PM      Result Value Range Status   Specimen Description BLOOD LEFT ARM   Final   Special Requests BOTTLES DRAWN AEROBIC ONLY [facility]   Final   Culture  Setup Time 07/26/2013 21:17   Final   Culture NO GROWTH 5 DAYS   Final   Report Status 08/01/2013 FINAL   Final  CULTURE, BLOOD (ROUTINE X 2)     Status: None   Collection Time    07/27/13  3:09 PM      Result Value Range Status   Specimen Description BLOOD RIGHT ARM   Final   Special Requests BOTTLES DRAWN AEROBIC ONLY [facility]   Final   Culture  Setup Time 07/27/2013 23:52   Final   Culture     Final   Value:        BLOOD CULTURE RECEIVED NO GROWTH TO DATE CULTURE WILL BE HELD FOR  5 DAYS BEFORE ISSUING A FINAL NEGATIVE REPORT   Report Status PENDING   Incomplete     Studies/Results: No results found.  Medications: Scheduled Meds: . amiodarone  200 mg Oral BID  . antiseptic oral rinse  15 mL Mouth Rinse QID  . aspirin  325 mg Oral Daily  .  ceFAZolin (ANCEF) IV  2 g Intravenous Q8H  . chlorhexidine  15 mL Mouth/Throat BID  . famotidine  20 mg Oral BID  . feeding supplement  1 Container Oral TID BM  . heparin  5,000 Units Subcutaneous Q8H  . metoprolol tartrate  12.5 mg Oral BID  . mometasone-formoterol  2 puff Inhalation BID  . potassium chloride  40 mEq Oral BID  . sodium chloride  10-40 mL Intracatheter Q12H  . sodium chloride  3 mL Intravenous Q12H  . triamcinolone cream  1 application Topical Weekly   Continuous Infusions:   PRN Meds:.acetaminophen, albuterol, bisacodyl, diphenhydrAMINE, hydrALAZINE, LORazepam, morphine injection, morphine injection, ondansetron (ZOFRAN) IV, sodium chloride    LOS: 15 days   [facility]  [facility] Pager [phone]. If 8PM-8AM, please contact night-coverage at [email or web address], password [facility] 08/01/2013, 2:48 PM  LOS: 15 days

## 2013-08-01 NOTE — Progress Notes (Signed)
6 Days Post-Op  Subjective: No complaints  Objective: Vital signs in last 24 hours: Temp:  [97.6 F (36.4 C)-97.7 F (36.5 C)] 97.6 F (36.4 C) (08/03 0509) Pulse Rate:  [50-80] 80 (08/03 0509) Resp:  [16-20] 20 (08/03 0509) BP: (113-129)/(59-73) 114/69 mmHg (08/03 0509) SpO2:  [95 %-100 %] 95 % (08/03 0758) Weight:  [109 lb 5.6 oz (49.6 kg)] 109 lb 5.6 oz (49.6 kg) (08/03 0458) Last BM Date: 08/01/13  Intake/Output from previous day: 08/02 0701 - 08/03 0700 In: 1120 [P.O.:870; IV Piggyback:250] Out: -  Intake/Output this shift:    GI: soft, nontender. +bm. incision looks good  Lab Results:  No results found for this basename: WBC, HGB, HCT, PLT,  in the last 72 hours BMET  Recent Labs  07/31/13 0355 08/01/13 0450  NA 133* 136  K 3.3* 3.9  CL 104 107  CO2 24 23  GLUCOSE 97 82  BUN 10 9  CREATININE 0.66 0.67  CALCIUM 7.4* 7.7*   PT/INR No results found for this basename: LABPROT, INR,  in the last 72 hours ABG No results found for this basename: PHART, PCO2, PO2, HCO3,  in the last 72 hours  Studies/Results: No results found.  Anti-infectives: Anti-infectives   Start     Dose/Rate Route Frequency Ordered Stop   07/28/13 1800  ceFAZolin (ANCEF) IVPB 2 g/50 mL premix     2 g 100 mL/hr over 30 Minutes Intravenous 3 times per day 07/28/13 1620     07/26/13 0932  cefOXitin (MEFOXIN) 2 g in dextrose 5 % 50 mL IVPB  Status:  Discontinued     2 g 100 mL/hr over 30 Minutes Intravenous 30 min pre-op 07/26/13 0925 07/26/13 1415   07/26/13 0900  vancomycin (VANCOCIN) IVPB 1000 mg/200 mL premix  Status:  Discontinued     1,000 mg 200 mL/hr over 60 Minutes Intravenous Every 24 hours 07/26/13 0827 07/28/13 1620   07/25/13 1000  vancomycin (VANCOCIN) IVPB 750 mg/150 ml premix  Status:  Discontinued     750 mg 150 mL/hr over 60 Minutes Intravenous Every 24 hours 07/25/13 0836 07/26/13 0827   07/25/13 1000  piperacillin-tazobactam (ZOSYN) IVPB 3.375 g  Status:   Discontinued     3.375 g 12.5 mL/hr over 240 Minutes Intravenous Every 8 hours 07/25/13 0836 07/28/13 1620      Assessment/Plan: s/p Procedure(s): small bowel resection (N/A) Stable from surgical standpoint Disposition per medicine  LOS: 15 days    TOTH III,Edelmiro Innocent S 08/01/2013

## 2013-08-02 LAB — CULTURE, BLOOD (ROUTINE X 2)

## 2013-08-02 MED ORDER — HYDROCODONE-ACETAMINOPHEN 5-325 MG PO TABS: 1.0000 | ORAL_TABLET | Freq: Four times a day (QID) | ORAL | Status: DC | PRN

## 2013-08-02 MED ORDER — CEFAZOLIN SODIUM-DEXTROSE 2-3 GM-% IV SOLR: 2.0000 g | Freq: Three times a day (TID) | INTRAVENOUS | Status: DC

## 2013-08-02 MED ORDER — AMIODARONE HCL 200 MG PO TABS: 200.0000 mg | ORAL_TABLET | Freq: Two times a day (BID) | ORAL | Status: AC

## 2013-08-02 MED ORDER — METOPROLOL TARTRATE 12.5 MG HALF TABLET: 12.5000 mg | ORAL_TABLET | Freq: Two times a day (BID) | ORAL | Status: DC

## 2013-08-02 MED ORDER — ASPIRIN 325 MG PO TABS: 325.0000 mg | ORAL_TABLET | Freq: Every day | ORAL | Status: AC

## 2013-08-02 MED ORDER — HEPARIN SOD (PORK) LOCK FLUSH 100 UNIT/ML IV SOLN
250.0000 [IU] | Freq: Every day | INTRAVENOUS | Status: DC
  Filled 2013-08-02: qty 3

## 2013-08-02 MED ORDER — POTASSIUM CHLORIDE CRYS ER 20 MEQ PO TBCR: 20.0000 meq | EXTENDED_RELEASE_TABLET | Freq: Two times a day (BID) | ORAL | Status: AC

## 2013-08-02 MED ORDER — FAMOTIDINE 20 MG PO TABS: 20.0000 mg | ORAL_TABLET | Freq: Two times a day (BID) | ORAL | Status: AC

## 2013-08-02 MED ORDER — HEPARIN SOD (PORK) LOCK FLUSH 100 UNIT/ML IV SOLN
250.0000 [IU] | INTRAVENOUS | Status: DC | PRN
  Administered 2013-08-02 (×2): 250 [IU]
  Filled 2013-08-02: qty 3

## 2013-08-02 MED ORDER — BOOST / RESOURCE BREEZE PO LIQD: 1.0000 | Freq: Three times a day (TID) | ORAL | Status: DC

## 2013-08-02 MED ORDER — OXYCODONE HCL 5 MG PO TABS
5.0000 mg | ORAL_TABLET | Freq: Four times a day (QID) | ORAL | Status: DC | PRN
  Administered 2013-08-02: 5 mg via ORAL
  Filled 2013-08-02: qty 1

## 2013-08-02 NOTE — Progress Notes (Signed)
Pt transported to [facility] via ambulance. Family was notified of pt's departure. Pt is hemodynamically stable. No complaint of pain.

## 2013-08-02 NOTE — Discharge Summary (Signed)
Physician Discharge Summary  Ariel Hunt ZOX:[id number] DOB: 1923/12/18 DOA: 07/17/2013  PCP: Daisy Floro, MD  Admit date: 07/17/2013 Discharge date: 08/02/2013  Time spent: 50 minutes  Recommendations for Outpatient Follow-up:  1. PCP in 1 week 2. Dr.Ingram, [facility] in 7-10days 3. Stop IV Ancef on 8/11 4. DC PICC line after IV Ancef completed 5. Repeat Blood Cultures 1 week after IV Ancef completed 6. FU with Dr.MArk Anne Fu and evaluate need for anticoagulation upon follow up  Discharge Diagnoses:  Principal Problem:   Small bowel obstruction Active Problems:   COLLAGENOUS COLITIS   Hypertension   CAD (coronary artery disease)   Hyperlipidemia   Anemia   Heart murmur, systolic   DNR (do not resuscitate)   Aortic stenosis, severe   Arrhythmia   NSTEMI (non-ST elevated myocardial infarction)   Ventricular tachycardia   Hypokalemia   Hyperkalemia   HCAP (healthcare-associated pneumonia)   Acute systolic CHF (congestive heart failure)   Preoperative respiratory examination   Bacteremia due to Staphylococcus   Discharge Condition: stable  Diet recommendation: heart healthy, low sodium  Filed Weights   07/31/13 0504 08/01/13 0458 08/02/13 0524  Weight: 51.9 kg (114 lb 6.7 oz) 49.6 kg (109 lb 5.6 oz) 49.7 kg (109 lb 9.1 oz)    History of present illness:  Ariel Hunt is an 79 y.o. female with hx of mild to moderate aortic stenosis, CAD, SBO presented to the ER with increase abdominal pain and distention for 2 days. She has an abdominal CT which showed SBO with transitional point in the small bowel, quarry luminal narrowing.  Her last colonoscopy was in 2009, where she was dx with collagenous colitis.   Hospital Course:  79 y.o. female with hx of mild to moderate aortic stenosis, CAD, SBO was admitted with Bowel obstruction. CT Abdomen/Pelvis showed SBO with transitional point in the small bowel, query luminal narrowing. Surgery was consulted,  Despite conservative  measures, SBO did not resolve. Patient was a high risk surgical candidate but since she did not resolve with conservative measures, after careful consideration and optimizing her medical care, she underwent surgery on 7/28. She developed cardiac arrhythmias (A. fib and NSVT) post op and was transferred to the step down unit and is on IV amiodarone with no further episodes. She also spiked fever secondary to staph bacteremia, subsequently her PICC Line was removed. Her ileus resolved slowly and is being discharged to SNF .  Detailed Course:  1. SBO: Patient was initially treated conservatively with bowel rest, NG tube decompression, IV fluids and mobilization but failed. Once her medical conditions were stabilized, she underwent Ex- lap, lysis of adhesions and small bowel resection on 7/28.  -had NG tube for ileus, output minimal, NG Dced 7/31  -was on TNA in the interim which was discontinued 3 days ago -diet slowly advanced to bland diet   2. MSSA bacteremia:suspected to be PICC related. Infectious disease consulted , PICC line was removed on 7/28. Surveillance blood cultures x 2 on 7/29 neg to date.  -was initially on IV VAnc, now on IV Ancef for 2 weeks till 8/11 with repeat BCx when she has been off therapy at least 1 week.   3.  Volume overload/? Acute systolic CHF: Post op she had volume overload,  dyspnea improved after a dose of IV Lasix on 7/27.  -stopped IVF, DC TNA  -ECHo with normal Ef and mod to severe AS  -on PO lasix now  5.Hypokalemia/hyperkalemia/hypomagnesemia/hypocalcemia: - repleteted  6. Anemia:  Patient has a history of chronic anemia.  -some worsening with acute illness   7. Arrhythmia(NSVT/SVT/Afib)/Hypotension: Overnight on 07/18/13, patient developed tachyarrhythmias, with V. Tach, SVT and A,fib with RVR. Dr Armanda Magic provided cardiology consultation, patient was managed with Amiodarone bolus, iv Lopressor and Amiodarone infusion, subsequently rapid A. Fib, followed  by Hypotension and started on NeoSynephrine. NeoSynephrine was discontinued on 7/22  Cards following, change Amiodarone to PO and change IV lopressor to PO.  -per Dr.Skains could start low dose eloquis in the furture, will defer this to follow up with him.   8. History of colitis: Patient has known history of Collagenous colitis, on Entocort. This appears clinically stable at this time. Was on stress doses of steroids, which was stopped, resume home dose entercort .  9. CAD/Demand ischemia: Known history of CAD, S/P PCI/Stent. As described above, had bump in Cardiac enzymes post op, enzymes have leveled off. Patient likely had demand ischemia. Was briefly on IV heparin, maintained on Metoprolol and ASA. -was seen and followed by cardiology   10. GERD: Asymptomatic. On PO Pepcid.   11. LE wounds: Patient has chronic healing LLE wound, WOC consulted and recommendations implemented.   12.Protein-Calorie malnutrition: was on TNA, protein supplements at DC   Discharge Exam: Filed Vitals:   08/01/13 0758 08/01/13 1444 08/01/13 2119 08/02/13 0524  BP:  129/55 102/49 103/50  Pulse:  66 68 69  Temp:  97.6 F (36.4 C) 98 F (36.7 C) 98.5 F (36.9 C)  TempSrc:  Oral Oral Oral  Resp:  20 20 20   Height:      Weight:    49.7 kg (109 lb 9.1 oz)  SpO2: 95% 100% 97% 98%    General: AAOx3 Cardiovascular: S1S2/RRR Respiratory:CTAB  Discharge Instructions  Discharge Orders   Future Orders Complete By Expires     Diet - low sodium heart healthy  As directed     Increase activity slowly  As directed         Medication List    STOP taking these medications       aspirin EC 81 MG tablet  Replaced by:  aspirin 325 MG tablet     valsartan 160 MG tablet  Commonly known as:  DIOVAN      TAKE these medications       ADVAIR DISKUS 100-50 MCG/DOSE Aepb  Generic drug:  Fluticasone-Salmeterol  Inhale 1 puff into the lungs every 12 (twelve) hours.     amiodarone 200 MG tablet  Commonly  known as:  PACERONE  Take 1 tablet (200 mg total) by mouth 2 (two) times daily.     aspirin 325 MG tablet  Take 1 tablet (325 mg total) by mouth daily.     budesonide 3 MG 24 hr capsule  Commonly known as:  ENTOCORT EC  Take 6 mg by mouth daily with lunch.     ceFAZolin 2-3 GM-% Solr  Commonly known as:  ANCEF  Inject 50 mLs (2 g total) into the vein every 8 (eight) hours. Till 8/11     diphenhydrAMINE 25 mg capsule  Commonly known as:  BENADRYL  Take 25 mg by mouth every 6 (six) hours as needed for itching.     famotidine 20 MG tablet  Commonly known as:  PEPCID  Take 1 tablet (20 mg total) by mouth 2 (two) times daily.     feeding supplement Liqd  Take 1 Container by mouth 3 (three) times daily between meals.  furosemide 20 MG tablet  Commonly known as:  LASIX  Take 20 mg by mouth every morning.     HYDROcodone-acetaminophen 5-325 MG per tablet  Commonly known as:  NORCO/VICODIN  Take 1 tablet by mouth every 6 (six) hours as needed for pain.     metoprolol tartrate 12.5 mg Tabs tablet  Commonly known as:  LOPRESSOR  Take 0.5 tablets (12.5 mg total) by mouth 2 (two) times daily.     multivitamin tablet  Take 1 tablet by mouth daily.     potassium chloride SA 20 MEQ tablet  Commonly known as:  K-DUR,KLOR-CON  Take 1 tablet (20 mEq total) by mouth 2 (two) times daily.     traMADol 50 MG tablet  Commonly known as:  ULTRAM  Take 50 mg by mouth every 6 (six) hours as needed. For pain relief     triamcinolone cream 0.1 %  Commonly known as:  KENALOG  Apply 1 application topically once a week. Apply to back for itching       No Known Allergies    The results of significant diagnostics from this hospitalization (including imaging, microbiology, ancillary and laboratory) are listed below for reference.    Significant Diagnostic Studies: Ct Abdomen Pelvis Wo Contrast  07/17/2013   *RADIOLOGY REPORT*  Clinical Data:  Vomiting  CT ABDOMEN AND PELVIS WITHOUT  CONTRAST  Technique:  Multidetector CT imaging of the abdomen and pelvis was performed following the standard protocol without intravenous contrast.  Comparison: 07/11/2011  Findings:  Scar versus atelectasis is noted in the lung bases.  There is no pleural effusion identified.  There is no focal liver abnormality identified.  There are multiple calcified granulomas identified within the spleen.  Gallbladder is unremarkable.  No significant biliary dilatation.  Normal appearance of the pancreas.  The adrenal glands are both within normal limits.    The left kidney appears normal.  The right kidney is normal.  Urinary bladder is unremarkable.  Normal appearance of the uterus for patient's age.  There is calcified atherosclerotic disease affecting the abdominal aorta.  Aneurysmal dilatation of the infrarenal abdominal aorta is noted which measures up to 3.1 cm.  The stomach appears normal.  There is abnormal small bowel dilatation. The small bowel loops measure up to 3.82 cm.  The distal small bowel loops appear collapsed.  Transition point is in the difficult to identify due to lack of adequate oral contrast material.  There is moderate stool identified within the colon.  The distal colon and rectum appear to be decreased in caliber.  Review of the visualized osseous structures is significant for a marked scoliosis deformity.  There is multilevel degenerative disc disease identified throughout the lumbar spine.  Compression deformities are identified at T11 and T12 which are age indeterminate.  IMPRESSION:  1.  Examination is positive for a small bowel obstruction. Transition point is likely within the central small bowel mesentery. 2.  No evidence for bowel perforation. 3.  Scoliosis and multilevel spondylosis.  There are compression fractures at T11 and T12.   Original Report Authenticated By: Signa Kell, M.D.   Ir Fluoro Guide Cv Line Right  07/29/2013   *RADIOLOGY REPORT*  Clinical Data: Protein calorie  malnutrition and poor venous access. PICC line requested.  Powered PICC LINE PLACEMENT WITH ULTRASOUND AND FLUOROSCOPIC GUIDANCE  Fluoroscopy Time: 12 seconds  The right arm was prepped with chlorhexidine, draped in the usual sterile fashion using maximum barrier technique (cap and mask, sterile gown, sterile  gloves, large sterile sheet, hand hygiene and cutaneous antisepsis) and infiltrated locally with 1% Lidocaine.  Ultrasound demonstrated patency of the right basilic vein, and this was documented with an image.  Under real-time ultrasound guidance, this vein was accessed with a 21 gauge micropuncture needle and image documentation was performed.  The needle was exchanged over a guidewire for a peel-away sheath through which a five [place] dual lumen PICC trimmed to 43 cm was advanced, positioned with its tip at the lower SVC/right atrial junction.  Fluoroscopy during the procedure and fluoro spot radiograph confirms appropriate catheter position.  The catheter was flushed, secured to the skin with Prolene sutures, and covered with a sterile dressing.  Complications:  None  IMPRESSION: Successful right arm powered PICC line placement with ultrasound and fluoroscopic guidance.  The catheter is ready for use.  Read by Brayton El PA-C   Original Report Authenticated By: Tacey Ruiz, MD   Ir US Guide Vasc Access Right  07/29/2013   *RADIOLOGY REPORT*  Clinical Data: Protein calorie malnutrition and poor venous access. PICC line requested.  Powered PICC LINE PLACEMENT WITH ULTRASOUND AND FLUOROSCOPIC GUIDANCE  Fluoroscopy Time: 12 seconds  The right arm was prepped with chlorhexidine, draped in the usual sterile fashion using maximum barrier technique (cap and mask, sterile gown, sterile gloves, large sterile sheet, hand hygiene and cutaneous antisepsis) and infiltrated locally with 1% Lidocaine.  Ultrasound demonstrated patency of the right basilic vein, and this was documented with an image.  Under real-time  ultrasound guidance, this vein was accessed with a 21 gauge micropuncture needle and image documentation was performed.  The needle was exchanged over a guidewire for a peel-away sheath through which a five [place] dual lumen PICC trimmed to 43 cm was advanced, positioned with its tip at the lower SVC/right atrial junction.  Fluoroscopy during the procedure and fluoro spot radiograph confirms appropriate catheter position.  The catheter was flushed, secured to the skin with Prolene sutures, and covered with a sterile dressing.  Complications:  None  IMPRESSION: Successful right arm powered PICC line placement with ultrasound and fluoroscopic guidance.  The catheter is ready for use.  Read by Brayton El PA-C   Original Report Authenticated By: Tacey Ruiz, MD   Dg Chest Port 1 View  07/27/2013   *RADIOLOGY REPORT*  Clinical Data: Loculated left pleural effusion.  PORTABLE CHEST - 1 VIEW  Comparison: 07/26/2013.  Findings: The patient is rotated to the left on today's exam. Enteric tube remains present.  The left upper extremity PICC has been removed.  Aeration at the left lung base has improved.  The effusion appears to be the free flowing if no thoracentesis has been performed as there is less loculation and focal opacity at the left lung base. Dense retrocardiac opacity is present compatible with atelectasis and effusion in combination.  There is a right pleural effusion layering posteriorly with associated atelectasis. Monitoring leads are projected over the chest. Probable superimposed interstitial pulmonary edema.  IMPRESSION:  1.  Improved left lung aeration with persistent dense retrocardiac opacity likely representing effusion and atelectasis in combination. 2. Shifting right pleural effusion with right basilar atelectasis. 3.  Interstitial pulmonary edema.   Original Report Authenticated By: Andreas Newport, M.D.   Dg Chest Port 1 View  07/26/2013   *RADIOLOGY REPORT*  Clinical Data: Postop pneumonia   PORTABLE CHEST - 1 VIEW  Comparison: 07/25/2013  Findings: Cardiomediastinal silhouette is stable.  Stable NG tube position.  Atherosclerotic calcifications of thoracic  aorta again noted.  There is increasing partially loculated left pleural effusion. There is streaky airspace   left upper lobe suspicious for superimposed new infiltrate.  Persistent consolidation in left lower lobe.  Small amount of streaky atelectasis or infiltrate in the right lower lobe.  IMPRESSION: There is increasing partially loculated left pleural effusion. There is streaky airspace   left upper lobe suspicious for superimposed new infiltrate.  Persistent consolidation in left lower lobe.  Small amount of streaky atelectasis or infiltrate in the right lower lobe.   Original Report Authenticated By: Natasha Mead, M.D.   Dg Chest Port 1 View  07/25/2013   *RADIOLOGY REPORT*  Clinical Data: Fever and congestion  PORTABLE CHEST - 1 VIEW  Comparison:  July 23, 2013  Findings:  Tube and catheter positions are unchanged.  No pneumothorax.  There is new effusion on the right with right base atelectasis.  Left lower lobe consolidation remains.  Heart is mildly enlarged with normal pulmonary vascularity.  No adenopathy. There is atherosclerotic change in the aorta. Bones are osteoporotic.  IMPRESSION: Persistent left lower lobe consolidation.  New effusion on the right with patchy right base atelectasis.  No pneumothorax.  No change in cardiac silhouette.   Original Report Authenticated By: Bretta Bang, M.D.   Dg Chest Port 1 View  07/23/2013   *RADIOLOGY REPORT*  Clinical Data: Confirm line placement.  PORTABLE CHEST - 1 VIEW  Comparison: Chest x-ray 07/19/2013.  Findings: New left upper extremity PICC with tip terminating at the superior cavoatrial junction.  Nasogastric tube extends into the proximal stomach.  Lung volumes appear slightly increased and there is pruning of the pulmonary vasculature in the periphery, compatible with  underlying COPD.  New dense opacification in the left base, compatible with atelectasis and/or consolidation in the left lower lobe.  Small left pleural effusion.  Probable subsegmental atelectasis in the right lower lobe.  No evidence of pulmonary edema.  Heart size is upper limits of normal.  Upper mediastinal contours are within normal limits.  Atherosclerosis in the thoracic aorta.  IMPRESSION: i1.  Support apparatus, as above. 2.  Worsening aeration at the left base, concerning for a new areas of atelectasis and/or consolidation, with superimposed small left pleural effusion.  Clinical correlation for signs and symptoms of infection or sequelae of recent aspiration is recommended. 3.  Atherosclerosis.   Original Report Authenticated By: Trudie Reed, M.D.   Dg Chest Port 1 View  07/19/2013   *RADIOLOGY REPORT*  Clinical Data: Congestion  PORTABLE CHEST - 1 VIEW  Comparison: Prior radiograph from 09/01/2012  Findings: Enteric tube courses into the abdomen with nonvisualization of the tip and side hole.  Heart size is at the upper limits of normal, stable as compared to prior exam.  The hila are prominent bilaterally.  The lungs are mildly hypoinflated.  There is streaky and linear opacities within both lung bases, most consistent with atelectasis. A small left pleural effusion is present.  There is no overt pulmonary edema.  No pneumothorax.  Irregular biapical pleural thickening is noted.  Diffuse osteopenia is present.  No acute osseous abnormalities. Remote this healed left-sided rib fractures are again noted.  IMPRESSION:  Bibasilar atelectasis, left greater than right, with small left pleural effusion.  No overt pulmonary edema or infectious infiltrate identified.   Original Report Authenticated By: Rise Mu, M.D.   Dg Abd 2 Views  07/26/2013   *RADIOLOGY REPORT*  Clinical Data: Follow-up small bowel obstruction.  ABDOMEN - 2 VIEW  Comparison: 07/22/2013.  Findings: There are persistent  findings compatible with small bowel obstruction with dilated loops of bowel measuring up to 5 cm in the central abdomen and pelvis.  Enteric tube remains present within the stomach.  Severe scoliosis.  On the decubitus film, there is no free air.  Fluid levels are identified.  IMPRESSION: No interval change in small bowel obstruction.   Original Report Authenticated By: Andreas Newport, M.D.   Dg Abd Portable 1v  07/22/2013   *RADIOLOGY REPORT*  Clinical Data: Follow-up small bowel obstruction  PORTABLE ABDOMEN - 1 VIEW  Comparison: Yesterday  Findings: Distended small bowel loops are stable.  The NG tube remains in place and the stomach is very decompressed with minimal gas. No obvious free intraperitoneal gas.  Colonic gas is present. Postoperative changes are noted.  Vascular calcifications. Osteopenia.  Severe levoscoliosis at L2-3.  IMPRESSION: Stable partial small bowel obstruction pattern.   Original Report Authenticated By: Jolaine Click, M.D.   Dg Abd Portable 1v  07/21/2013   *RADIOLOGY REPORT*  Clinical Data: Small bowel obstruction.  Abdominal distention.  PORTABLE ABDOMEN - 1 VIEW  Comparison: 07/19/2013  Findings: NG tube tip is in the body of the stomach.  There is persistent prominent dilatation of the small bowel loops in the mid abdomen, essentially unchanged.  The colon is not distended.  IMPRESSION: Persistent small bowel obstruction, unchanged.   Original Report Authenticated By: Francene Boyers, M.D.   Dg Abd Portable 1v  07/19/2013   *RADIOLOGY REPORT*  Clinical Data: Small bowel obstruction.  Increased pain.  PORTABLE ABDOMEN - 1 VIEW  Comparison: CT abdomen and pelvis 07/17/2013.  Abdomen 07/14/2011.  Findings: Gas filled moderately distended mid abdominal small bowel loops are present with suggestion of fold thickening which could represent edema or infiltrative process.  Paucity of gas in the colon.  Enteric tube with tip in the left upper quadrant consistent with location in the mid  stomach.  Sclerosis and degenerative changes in the lumbar spine with vertebral compression deformities noted.  This is stable.  Degenerative changes in the hips.  IMPRESSION: Mid abdominal small bowel distension consistent with obstruction with fold thickening consistent with edema, mural hemorrhage, or inflammatory process.   Original Report Authenticated By: Burman Nieves, M.D.    Microbiology: Recent Results (from the past 240 hour(s))  CULTURE, BLOOD (ROUTINE X 2)     Status: None   Collection Time    07/25/13  8:15 AM      Result Value Range Status   Specimen Description BLOOD RIGHT ARM   Final   Special Requests BOTTLES DRAWN AEROBIC AND ANAEROBIC [facility] EACH   Final   Culture  Setup Time 07/25/2013 15:17   Final   Culture     Final   Value: STAPHYLOCOCCUS AUREUS     Note: SUSCEPTIBILITIES PERFORMED ON PREVIOUS CULTURE WITHIN THE LAST 5 DAYS.     Note: Gram Stain Report Called to,Read Back By and Verified With: Rita Ohara RN on 07/26/13 at 04:45 by Christie Nottingham   Report Status 07/28/2013 FINAL   Final  CULTURE, BLOOD (ROUTINE X 2)     Status: None   Collection Time    07/25/13  8:36 AM      Result Value Range Status   Specimen Description BLOOD RIGHT HAND   Final   Special Requests BOTTLES DRAWN AEROBIC AND ANAEROBIC [facility] EACH   Final   Culture  Setup Time 07/25/2013 15:17   Final   Culture  Final   Value: STAPHYLOCOCCUS AUREUS     Note: RIFAMPIN AND GENTAMICIN SHOULD NOT BE USED AS SINGLE DRUGS FOR TREATMENT OF STAPH INFECTIONS.     Note: Gram Stain Report Called to,Read Back By and Verified With: Rita Ohara RN on 07/26/13 at 04:45 by Christie Nottingham   Report Status 07/28/2013 FINAL   Final   Organism ID, Bacteria STAPHYLOCOCCUS AUREUS   Final  CULTURE, BLOOD (ROUTINE X 2)     Status: None   Collection Time    07/26/13  3:59 PM      Result Value Range Status   Specimen Description BLOOD LEFT ARM   Final   Special Requests BOTTLES DRAWN AEROBIC ONLY [facility]   Final   Culture  Setup  Time 07/26/2013 21:17   Final   Culture NO GROWTH 5 DAYS   Final   Report Status 08/01/2013 FINAL   Final  CULTURE, BLOOD (ROUTINE X 2)     Status: None   Collection Time    07/27/13  3:09 PM      Result Value Range Status   Specimen Description BLOOD RIGHT ARM   Final   Special Requests BOTTLES DRAWN AEROBIC ONLY 1CC   Final   Culture  Setup Time 07/27/2013 23:52   Final   Culture NO GROWTH 5 DAYS   Final   Report Status 08/02/2013 FINAL   Final     Labs: Basic Metabolic Panel:  Recent Labs Lab 07/28/13 0415 07/29/13 0445 07/30/13 0405 07/31/13 0355 08/01/13 0450  NA 134* 135 136 133* 136  K 3.5 3.0* 3.1* 3.3* 3.9  CL 103 103 103 104 107  CO2 23 23 25 24 23   GLUCOSE 100* 86 106* 97 82  BUN 18 14 12 10 9   CREATININE 0.80 0.74 0.64 0.66 0.67  CALCIUM 7.5* 7.8* 7.8* 7.4* 7.7*  MG 2.1 2.1  --   --   --   PHOS 3.2 3.3  --   --   --    Liver Function Tests:  Recent Labs Lab 07/29/13 0445  AST 12  ALT 16  ALKPHOS 45  BILITOT 0.3  PROT 4.5*  ALBUMIN 1.8*   No results found for this basename: LIPASE, AMYLASE,  in the last 168 hours No results found for this basename: AMMONIA,  in the last 168 hours CBC:  Recent Labs Lab 07/27/13 0415 07/28/13 0415 07/29/13 0445  WBC 12.5* 9.5 8.0  HGB 8.6* 8.6* 9.5*  HCT 27.3* 26.4* 28.8*  MCV 79.1 77.9* 77.4*  PLT 158 206 212   Cardiac Enzymes: No results found for this basename: CKTOTAL, CKMB, CKMBINDEX, TROPONINI,  in the last 168 hours BNP: BNP (last 3 results)  Recent Labs  07/25/13 0345 07/28/13 0415  PROBNP 7166.0* 1586.0*   CBG:  Recent Labs Lab 07/30/13 2304 07/31/13 0713 07/31/13 1155 07/31/13 1728 07/31/13 2125  GLUCAP 90 86 101* 107* 78       Signed:  Walsie Smeltz  [facility] 08/02/2013, 11:22 AM

## 2013-08-02 NOTE — Progress Notes (Addendum)
Patient ID: Ariel Hunt, female   DOB: 1923-01-16, 79 y.o.   MRN: [id number] 7 Days Post-Op  Subjective: C/O severe back pain, cant move well due to eat, cant walk due to it, moves across back Abd min tender, denies n/v, tolerating diet well  Objective: Vital signs in last 24 hours: Temp:  [97.6 F (36.4 C)-98.5 F (36.9 C)] 98.5 F (36.9 C) (08/04 0524) Pulse Rate:  [66-69] 69 (08/04 0524) Resp:  [20] 20 (08/04 0524) BP: (102-129)/(49-55) 103/50 mmHg (08/04 0524) SpO2:  [97 %-100 %] 98 % (08/04 0524) Weight:  [109 lb 9.1 oz (49.7 kg)] 109 lb 9.1 oz (49.7 kg) (08/04 0524) Last BM Date: 08/01/13  Intake/Output from previous day: 08/03 0701 - 08/04 0700 In: 1210 [P.O.:1060; IV Piggyback:150] Out: -  Intake/Output this shift:    General appearance: alert.  Cooperative. In no distress. Elderly and deconditioned. GI: abdomen soft. Wound clean with staples.  +bowel sounds.   Lab Results:  No results found for this or any previous visit (from the past 24 hour(s)).   Studies/Results: @RISRSLT24 @  . amiodarone  200 mg Oral BID  . antiseptic oral rinse  15 mL Mouth Rinse QID  . aspirin  325 mg Oral Daily  .  ceFAZolin (ANCEF) IV  2 g Intravenous Q8H  . chlorhexidine  15 mL Mouth/Throat BID  . famotidine  20 mg Oral BID  . feeding supplement  1 Container Oral TID BM  . heparin  5,000 Units Subcutaneous Q8H  . metoprolol tartrate  12.5 mg Oral BID  . mometasone-formoterol  2 puff Inhalation BID  . potassium chloride  40 mEq Oral BID  . sodium chloride  10-40 mL Intracatheter Q12H  . sodium chloride  3 mL Intravenous Q12H  . triamcinolone cream  1 application Topical Weekly     Assessment/Plan: POD #7. Laparotomy, lysis of adhesions,Small bowel resection - 07/21/2013 - [facility]  Stable   Ileus resolving.  Tolerating bland diet Ok with surgery to discharge to rehab Add oxycodone for pain  mobilize out of bed.   Back Pain: chronic, will add K-pad to help  ID. - On  Vanc. and Zosyn per primary service. ID following. There is no indication for antibiotics from the standpoint of her bowel surgery.   Steroids -  These have now been discontinued.  Protein calorie malnutrition: on bland diet now, off tna .  Suspect LLL pneumonia.No increased work of breathing postop.CCM has signed off.    LOS: 16 days    Hunt, Ariel 08/02/2013  Agree with above. Daughter and son are in room Latesa Fratto and [name]). She has been discharged, but there are some issues about where she is going. Still has staples.  Staples would probably best served staying in 5 to 7 more day.  She can see Dr. Derrell Lolling back in 7 - 10 days for staple removal and wound check.  Ovidio Kin, MD, [facility] Pager: [phone] Office phone:  [phone]

## 2013-08-02 NOTE — Progress Notes (Addendum)
Patient cleared for discharge. Packet copied and placed in [place]. [facility] called for transportation. Patient's daughter informed of d/c, she is glad that patient is feeling well enough to go.  Arrion Broaddus C. Minard Millirons MSW, [facility] [phone] CSW received call from patient's son, he wants to cancel transport as they just went to [facility] home and don't like patient's roommate. CSW explained that patient has been cleared for discharge and there is no other plan in place. They asked that CSW give them a few minutes.  Elianny Buxbaum C. Lilya Smitherman MSW, [facility] [phone] Met with family at bedside. [facility]. [facility] and [facility] do have a bed available. Family went over there to sign paperwork.

## 2013-08-02 NOTE — Progress Notes (Signed)
OT Cancellation Note  Patient Details Name: Ariel Hunt MRN: [id number] DOB: 01-19-1923   Cancelled Treatment:    Reason Eval/Treat Not Completed: Other (comment) (pt requesting later despite encouragement to get up. Pt asking to do therapy "later" but explained that it is important to try to get up and move to get stronger. Pt still requesting later today. Will try back as schedule allows.   Lennox Laity [phone] 08/02/2013, 10:40 AM

## 2013-08-02 NOTE — Clinical Social Work Placement (Addendum)
    Clinical Social Work Department CLINICAL SOCIAL WORK PLACEMENT NOTE 08/02/2013  Patient:  Ariel Hunt, Ariel Hunt  Account Number:  [id number] Admit date:  07/17/2013  Clinical Social Worker:  Becky Sax, LCSW  Date/time:  08/02/2013 12:00 M  Clinical Social Work is seeking post-discharge placement for this patient at the following level of care:   SKILLED NURSING   (*CSW will update this form in Epic as items are completed)   08/02/2013  Patient/family provided with [facility] Department of Clinical Social Work's list of facilities offering this level of care within the geographic area requested by the patient (or if unable, by the patient's family).  08/02/2013  Patient/family informed of their freedom to choose among providers that offer the needed level of care, that participate in Medicare, Medicaid or managed care program needed by the patient, have an available bed and are willing to accept the patient.  08/02/2013  Patient/family informed of [facility]' ownership interest in [facility], as well as of the fact that they are under no obligation to receive care at this facility.  PASARR submitted to EDS on 08/02/2013 PASARR number received from EDS on 08/02/2013  FL2 transmitted to all facilities in geographic area requested by pt/family on  08/02/2013 FL2 transmitted to all facilities within larger geographic area on   Patient informed that his/her managed care company has contracts with or will negotiate with  certain facilities, including the following:     Patient/family informed of bed offers received:  08/02/2013 Patient chooses bed at [facility] Physician recommends and patient chooses bed at    [facility] to be [facility]  on  08/02/2013 Patient to be transferred to facility by [facility]  The following physician request were entered in Epic:   Additional Comments:

## 2013-08-02 NOTE — Progress Notes (Signed)
CSW spoke with patient's daughter. They are requesting [facility].  Hollis Tuller C. Toniya Rozar MSW, LCSW [phone]

## 2013-08-12 ENCOUNTER — Ambulatory Visit (INDEPENDENT_AMBULATORY_CARE_PROVIDER_SITE_OTHER): Payer: Medicare Other | Admitting: General Surgery

## 2013-08-12 ENCOUNTER — Encounter (INDEPENDENT_AMBULATORY_CARE_PROVIDER_SITE_OTHER): Payer: Self-pay | Admitting: General Surgery

## 2013-08-12 VITALS — BP 98/68 | HR 64 | Temp 98.1°F | Resp 14 | Ht 62.0 in | Wt 89.0 lb

## 2013-08-12 DIAGNOSIS — K56609 Unspecified intestinal obstruction, unspecified as to partial versus complete obstruction: Secondary | ICD-10-CM

## 2013-08-12 NOTE — Patient Instructions (Signed)
You appear to be recovering from your abdominal surgery for bowel obstruction without any major surgical complications. Your staples will be removed today.  I am concerned because you looked anemic. You have been given a prescription to get a blood test called a CBC. You should have this done promptly and discuss this with her managing physician at the nursing facility.  If there are no other problems, return to see Dr. Derrell Lolling as needed.

## 2013-08-12 NOTE — Progress Notes (Signed)
Patient ID: Ariel Hunt, female   DOB: Jun 23, 1923, 79 y.o.   MRN: [id number] History this 79 year old woman who was recently managed at [facility] for recurrent small bowel obstruction. She declined surgery for a week and then consented. On 07/26/2013 I performed a laparotomy, lysis of adhesions, and small bowel resdection. She had a chronic stricture that required segmental resection with primary anastomosis. There was no evidence of cancer. She was quite debilitated, but recovered better than I would have thought. She has significant comorbidities including protein calorie malnutrition, hospital-acquired pneumonia, aortic stenosis, systolic congestive heart failure, staphylococcal bacteremia. She had significant anemia on admission and required transfusion. Daughter says this been a prior history of anemia.  Her primary care physician is Dr. Judithann Sauger.  She's currently residing at [facility] skilled nursing facility in [place]  The patient states that her appetite is good. She says her bowels are functioning well and she is voiding uneventfully. She's able to ambulate with a walker. Denies abdominal pain or wound problems    Exam: Patient is alert and fairly. She looks pale and anemic. Lungs clear to auscultation Abdomen soft and nontender. Wound appears to be healing normally. Staples removed and Steri-Strips applied.    Assessment: Recurrent small bowel obstruction and small bowel stricture, recovering slowly but uneventfully following laparotomy, lysis of adhesions small bowel resection Suspect recurrent anemia. Etiology unclear Hospital-acquired pneumonia, recovering Protein calorie malnutrition Aortic stenosis Systolic congestive heart failure Recent staph bacteremia    Plan: Diet & activities discussed. Unless she has  abdominal or surgical problems, she does not need to followup with me I discussed my suspicion of anemia with the daughter and the patient. I told them  that she should have a CBC and that her managing physician should address this immediately. I gave her a prescription for a CBC Return to see me if further abdominal problems arise.    Angelia Mould. Derrell Lolling, M.D., [facility] General and Minimally invasive Surgery Breast and Colorectal Surgery Office:   [phone] Pager:   [phone]

## 2013-09-24 ENCOUNTER — Emergency Department (HOSPITAL_COMMUNITY): Payer: Medicare Other

## 2013-09-24 ENCOUNTER — Emergency Department (HOSPITAL_COMMUNITY)
Admission: EM | Admit: 2013-09-24 | Discharge: 2013-09-24 | Disposition: A | Discharge status: Home / Self Care | Payer: Medicare Other | Attending: Emergency Medicine | Admitting: Emergency Medicine

## 2013-09-24 ENCOUNTER — Encounter (HOSPITAL_COMMUNITY): Payer: Self-pay | Admitting: Emergency Medicine

## 2013-09-24 DIAGNOSIS — G8929 Other chronic pain: Secondary | ICD-10-CM | POA: Insufficient documentation

## 2013-09-24 DIAGNOSIS — Z7982 Long term (current) use of aspirin: Secondary | ICD-10-CM | POA: Insufficient documentation

## 2013-09-24 DIAGNOSIS — M25559 Pain in unspecified hip: Secondary | ICD-10-CM | POA: Insufficient documentation

## 2013-09-24 DIAGNOSIS — Y92009 Unspecified place in unspecified non-institutional (private) residence as the place of occurrence of the external cause: Secondary | ICD-10-CM | POA: Insufficient documentation

## 2013-09-24 DIAGNOSIS — Y939 Activity, unspecified: Secondary | ICD-10-CM | POA: Insufficient documentation

## 2013-09-24 DIAGNOSIS — J45909 Unspecified asthma, uncomplicated: Secondary | ICD-10-CM | POA: Insufficient documentation

## 2013-09-24 DIAGNOSIS — I10 Essential (primary) hypertension: Secondary | ICD-10-CM | POA: Insufficient documentation

## 2013-09-24 DIAGNOSIS — I2510 Atherosclerotic heart disease of native coronary artery without angina pectoris: Secondary | ICD-10-CM | POA: Insufficient documentation

## 2013-09-24 DIAGNOSIS — W010XXA Fall on same level from slipping, tripping and stumbling without subsequent striking against object, initial encounter: Secondary | ICD-10-CM | POA: Insufficient documentation

## 2013-09-24 DIAGNOSIS — E785 Hyperlipidemia, unspecified: Secondary | ICD-10-CM | POA: Insufficient documentation

## 2013-09-24 DIAGNOSIS — I498 Other specified cardiac arrhythmias: Secondary | ICD-10-CM | POA: Insufficient documentation

## 2013-09-24 DIAGNOSIS — W19XXXA Unspecified fall, initial encounter: Secondary | ICD-10-CM

## 2013-09-24 DIAGNOSIS — Z79899 Other long term (current) drug therapy: Secondary | ICD-10-CM | POA: Insufficient documentation

## 2013-09-24 DIAGNOSIS — Z8719 Personal history of other diseases of the digestive system: Secondary | ICD-10-CM | POA: Insufficient documentation

## 2013-09-24 DIAGNOSIS — Z87891 Personal history of nicotine dependence: Secondary | ICD-10-CM | POA: Insufficient documentation

## 2013-09-24 DIAGNOSIS — I4891 Unspecified atrial fibrillation: Secondary | ICD-10-CM | POA: Insufficient documentation

## 2013-09-24 LAB — URINALYSIS, ROUTINE W REFLEX MICROSCOPIC
Glucose, UA: NEGATIVE mg/dL
Leukocytes, UA: NEGATIVE
Nitrite: NEGATIVE
Protein, ur: NEGATIVE mg/dL
pH: 6.5 (ref 5.0–8.0)

## 2013-09-24 LAB — POCT I-STAT, CHEM 8
BUN: 20 mg/dL (ref 6–23)
HCT: 32 % — ABNORMAL LOW (ref 36.0–46.0)
Hemoglobin: 10.9 g/dL — ABNORMAL LOW (ref 12.0–15.0)
Sodium: 129 mEq/L — ABNORMAL LOW (ref 135–145)
TCO2: 24 mmol/L (ref 0–100)

## 2013-09-24 MED ORDER — HYDROCODONE-ACETAMINOPHEN 5-325 MG PO TABS
1.0000 | ORAL_TABLET | Freq: Once | ORAL | Status: AC
  Administered 2013-09-24: 1 via ORAL

## 2013-09-24 NOTE — ED Provider Notes (Signed)
CSN: [id number]     Arrival date & time 09/24/13  0840 History   First MD Initiated Contact with Patient 09/24/13 [phone]     Chief Complaint  Patient presents with  . Fall  . Dizziness   (Consider location/radiation/quality/duration/timing/severity/associated sxs/prior Treatment) HPI This 79 year old female lives at home alone she has a daily home health aid, she does have a walker but has not been able to use it at home and uses a cane instead and hangs onto objects at home to help her with her baseline shuffling gait and baseline generalized weakness, this morning she was trying to reach for her cane and felt at her baseline generalized weakness felt slightly lightheaded for a few seconds and ended up falling to the floor bumping both knees on the floor, she had no headache no loss of consciousness no amnesia no change in mental status no vertigo no syncope no neck pain no back pain no chest pain no shortness breath no abdominal pain no new focal or lateralizing weakness or numbness, she had some transient slight pain to the front of both knees but that is resolved now, at baseline she has chronic left posterior upper pelvic pain which is stable and unchanged today, she fell within an hour of her regularly scheduled home health aide arriving who is able to help the patient up from the floor and the patient is able to stand and walk again now at her baseline since her fall this morning. The patient was able to crawl herself on the floor when she fell but was not strong enough to stand up on her own but with assistance was able to stand back to her baseline state. Her only pain now is her typical left-sided posterior pelvic pain which she has had for years. She has had no recent fever rash vomiting diarrhea bloody stools or other concerns. Past Medical History  Diagnosis Date  . Colitis   . Weight loss   . Diarrhea   . CAD (coronary artery disease)   . Hyperlipidemia   . Hypertension   . Asthma   .  GERD (gastroesophageal reflux disease)   . Atrial fibrillation    Past Surgical History  Procedure Laterality Date  . Hernia repair      inguinal  . Coronary angioplasty with stent placement    . Bowel adhesions      several  . Laparotomy N/A 07/26/2013    Procedure: small bowel resection;  Surgeon: Ernestene Mention, MD;  Location: [facility];  Service: General;  Laterality: N/A;   Family History  Problem Relation Age of Onset  . Colon cancer Father   . Breast cancer Sister   . Breast cancer Maternal Aunt    History  Substance Use Topics  . Smoking status: Former Smoker    Types: Cigarettes    Quit date: 12/30/1990  . Smokeless tobacco: Never Used  . Alcohol Use: No   OB History   Grav Para Term Preterm Abortions TAB SAB Ect Mult Living                 Review of Systems 10 Systems reviewed and are negative for acute change except as noted in the HPI. Allergies  Review of patient's allergies indicates no known allergies.  Home Medications   Current Outpatient Rx  Name  Route  Sig  Dispense  Refill  . amiodarone (PACERONE) 200 MG tablet   Oral   Take 1 tablet (200 mg total) by mouth  2 (two) times daily.   30 tablet   0   . aspirin 325 MG tablet   Oral   Take 1 tablet (325 mg total) by mouth daily.   30 tablet      . budesonide (ENTOCORT EC) 3 MG 24 hr capsule   Oral   Take 6 mg by mouth daily with lunch.         . famotidine (PEPCID) 20 MG tablet   Oral   Take 1 tablet (20 mg total) by mouth 2 (two) times daily.   30 tablet   0   . ferrous sulfate 325 (65 FE) MG tablet   Oral   Take 325 mg by mouth daily with breakfast.         . Fluticasone-Salmeterol (ADVAIR DISKUS) 100-50 MCG/DOSE AEPB   Inhalation   Inhale 1 puff into the lungs every 12 (twelve) hours.           . furosemide (LASIX) 20 MG tablet   Oral   Take 20 mg by mouth every morning.         Marland Kitchen HYDROcodone-acetaminophen (NORCO/VICODIN) 5-325 MG per tablet   Oral   Take 1 tablet by  mouth every 6 (six) hours as needed for pain.   30 tablet   0   . lidocaine (LIDODERM) 5 %   Transdermal   Place 1 patch onto the skin daily. Remove & Discard patch within 12 hours or as directed by MD         . metoprolol tartrate (LOPRESSOR) 12.5 mg TABS tablet   Oral   Take 0.5 tablets (12.5 mg total) by mouth 2 (two) times daily.         . Multiple Vitamin (MULTIVITAMIN WITH MINERALS) TABS tablet   Oral   Take 1 tablet by mouth daily.         . potassium chloride (K-DUR,KLOR-CON) 20 MEQ tablet   Oral   Take 1 tablet (20 mEq total) by mouth 2 (two) times daily.         . promethazine (PHENERGAN) 25 MG tablet   Oral   Take 25 mg by mouth every 6 (six) hours as needed for nausea.         . traMADol (ULTRAM) 50 MG tablet   Oral   Take 50 mg by mouth every 6 (six) hours as needed. For pain relief         . triamcinolone cream (KENALOG) 0.1 %   Topical   Apply 1 application topically every Wednesday. Apply to back for itching         . valsartan (DIOVAN) 160 MG tablet   Oral   Take 160 mg by mouth daily.          BP 131/82  Pulse 113  Temp(Src) 97.5 F (36.4 C) (Oral)  Resp 18  SpO2 96% Physical Exam  Nursing note and vitals reviewed. Constitutional:  Awake, alert, nontoxic appearance.  HENT:  Head: Atraumatic.  Eyes: Right eye exhibits no discharge. Left eye exhibits no discharge.  Neck: Neck supple.  Cardiovascular: Regular rhythm.   No murmur heard. Bradycardic  Pulmonary/Chest: Effort normal and breath sounds normal. No respiratory distress. She has no wheezes. She has no rales. She exhibits no tenderness.  Abdominal: Soft. Bowel sounds are normal. She exhibits no distension and no mass. There is no tenderness. There is no rebound and no guarding.  Musculoskeletal: She exhibits tenderness. She exhibits no edema.  Baseline ROM, no obvious  new focal weakness. Trace baseline edema. Cervical spine nontender neck nontender back nontender arms  nontender leg is nontender including nontender knees and hips with good active baseline range of motion to both knees and both hips, patient has minimal tenderness to her left posterior pelvic brim which is baseline for her. She has capillary refill less than 2 seconds in all 4 extremities. She denies any knee pain or hip pain with active and passive range of motion.  Neurological: She is alert.  Mental status and motor strength appears baseline for patient and situation.  Skin: No rash noted.  Psychiatric: She has a normal mood and affect.    ED Course  Procedures (including critical care time) Patient / Family / Caregiver informed of clinical course, understand medical decision-making process, and agree with plan.Pt stable in ED with no significant deterioration in condition. Labs Review Labs Reviewed  POCT I-STAT, CHEM 8 - Abnormal; Notable for the following:    Sodium 129 (*)    Chloride 95 (*)    Hemoglobin 10.9 (*)    HCT 32.0 (*)    All other components within normal limits  URINALYSIS, ROUTINE W REFLEX MICROSCOPIC   Imaging Review Dg Pelvis 1-2 Views  09/24/2013   CLINICAL DATA:  Fall. Chronic hip pain.  EXAM: PELVIS - 1-2 VIEW  COMPARISON:  Multiple exams, including 07/26/2013 and 07/17/2013  FINDINGS: Prominent levoconvex lumbar scoliosis and spondylosis. Atherosclerosis noted.  Bony demineralization is present. Mild prominence of stool in the colon.  No discrete pelvic fracture is identified.  IMPRESSION: 1. Bony demineralization is present. No fracture identified. 2. Prominent lumbar scoliosis and spondylosis. 3. Atherosclerosis.   Electronically Signed   By: Herbie Baltimore   On: 09/24/2013 10:24    MDM   1. Fall, initial encounter    I doubt any other EMC precluding discharge at this time including, but not necessarily limited to the following:Vtach, SBI, CVA.    Hurman Horn, MD 09/25/13 [phone]

## 2013-09-24 NOTE — ED Notes (Signed)
Per daughter, pt fell this morning while getting into wheelchair. Pain to bilateral knees. Pt called daughter stating she felt "dizzy". Pt's caregiver arrived at 0800.

## 2013-09-24 NOTE — ED Notes (Signed)
Bed: WA24 Expected date:  Expected time:  Means of arrival:  Comments: EMS-abdominal pain 

## 2013-09-26 ENCOUNTER — Emergency Department (HOSPITAL_COMMUNITY): Payer: Medicare Other

## 2013-09-26 ENCOUNTER — Encounter (HOSPITAL_COMMUNITY): Payer: Self-pay | Admitting: Emergency Medicine

## 2013-09-26 ENCOUNTER — Emergency Department (HOSPITAL_COMMUNITY)
Admission: EM | Admit: 2013-09-26 | Discharge: 2013-09-26 | Disposition: A | Discharge status: Home / Self Care | Payer: Medicare Other | Source: Home / Self Care | Attending: Emergency Medicine | Admitting: Emergency Medicine

## 2013-09-26 DIAGNOSIS — S32000S Wedge compression fracture of unspecified lumbar vertebra, sequela: Secondary | ICD-10-CM

## 2013-09-26 DIAGNOSIS — E785 Hyperlipidemia, unspecified: Secondary | ICD-10-CM | POA: Insufficient documentation

## 2013-09-26 DIAGNOSIS — IMO0002 Reserved for concepts with insufficient information to code with codable children: Secondary | ICD-10-CM | POA: Insufficient documentation

## 2013-09-26 DIAGNOSIS — I4891 Unspecified atrial fibrillation: Secondary | ICD-10-CM | POA: Insufficient documentation

## 2013-09-26 DIAGNOSIS — M1990 Unspecified osteoarthritis, unspecified site: Secondary | ICD-10-CM | POA: Insufficient documentation

## 2013-09-26 DIAGNOSIS — I2510 Atherosclerotic heart disease of native coronary artery without angina pectoris: Secondary | ICD-10-CM | POA: Insufficient documentation

## 2013-09-26 DIAGNOSIS — M549 Dorsalgia, unspecified: Secondary | ICD-10-CM

## 2013-09-26 DIAGNOSIS — K219 Gastro-esophageal reflux disease without esophagitis: Secondary | ICD-10-CM | POA: Insufficient documentation

## 2013-09-26 DIAGNOSIS — Z79899 Other long term (current) drug therapy: Secondary | ICD-10-CM | POA: Insufficient documentation

## 2013-09-26 DIAGNOSIS — R109 Unspecified abdominal pain: Secondary | ICD-10-CM | POA: Insufficient documentation

## 2013-09-26 DIAGNOSIS — I10 Essential (primary) hypertension: Secondary | ICD-10-CM | POA: Insufficient documentation

## 2013-09-26 DIAGNOSIS — Y9321 Activity, ice skating: Secondary | ICD-10-CM | POA: Insufficient documentation

## 2013-09-26 DIAGNOSIS — S32009A Unspecified fracture of unspecified lumbar vertebra, initial encounter for closed fracture: Secondary | ICD-10-CM | POA: Insufficient documentation

## 2013-09-26 DIAGNOSIS — Z7982 Long term (current) use of aspirin: Secondary | ICD-10-CM | POA: Insufficient documentation

## 2013-09-26 DIAGNOSIS — Z87891 Personal history of nicotine dependence: Secondary | ICD-10-CM | POA: Insufficient documentation

## 2013-09-26 DIAGNOSIS — Y929 Unspecified place or not applicable: Secondary | ICD-10-CM | POA: Insufficient documentation

## 2013-09-26 DIAGNOSIS — W19XXXA Unspecified fall, initial encounter: Secondary | ICD-10-CM | POA: Insufficient documentation

## 2013-09-26 DIAGNOSIS — Z8719 Personal history of other diseases of the digestive system: Secondary | ICD-10-CM | POA: Insufficient documentation

## 2013-09-26 DIAGNOSIS — Z9861 Coronary angioplasty status: Secondary | ICD-10-CM | POA: Insufficient documentation

## 2013-09-26 DIAGNOSIS — J45901 Unspecified asthma with (acute) exacerbation: Secondary | ICD-10-CM | POA: Insufficient documentation

## 2013-09-26 MED ORDER — FENTANYL CITRATE 0.05 MG/ML IJ SOLN: 100.0000 ug | Freq: Once | INTRAMUSCULAR | Status: DC

## 2013-09-26 MED ORDER — FENTANYL CITRATE 0.05 MG/ML IJ SOLN
50.0000 ug | INTRAMUSCULAR | Status: DC | PRN
  Administered 2013-09-26: 50 ug via INTRAVENOUS
  Filled 2013-09-26: qty 2

## 2013-09-26 MED ORDER — FENTANYL 12 MCG/HR TD PT72: 1.0000 | MEDICATED_PATCH | TRANSDERMAL | Status: DC

## 2013-09-26 MED ORDER — FENTANYL 12 MCG/HR TD PT72
12.5000 ug | MEDICATED_PATCH | TRANSDERMAL | Status: DC
  Administered 2013-09-26: 12.5 ug via TRANSDERMAL
  Filled 2013-09-26: qty 1

## 2013-09-26 MED ORDER — FENTANYL CITRATE 0.05 MG/ML IJ SOLN: 50.0000 ug | Freq: Once | INTRAMUSCULAR | Status: DC

## 2013-09-26 NOTE — ED Notes (Signed)
Bed: WA06 Expected date:  Expected time:  Means of arrival:  Comments: EMS-SOB-c-pap

## 2013-09-26 NOTE — ED Notes (Signed)
Pt arrived EMS from home, C/O of SOB and right side flank pain and hip pain. Pt reports a fall on 09/24/13. Was treated and released for the fall. Patient is reporting pain on right side.

## 2013-09-26 NOTE — ED Provider Notes (Signed)
CSN: [id number]     Arrival date & time 09/26/13  1249 History   First MD Initiated Contact with Patient 09/26/13 1315     Chief Complaint  Patient presents with  . Shortness of Breath  . Flank Pain   (Consider location/radiation/quality/duration/timing/severity/associated sxs/prior Treatment) HPI Comments: Ariel Hunt is a 79 y.o. Female presents for evaluation of left lower back pain. The pain has been persistent, for several days since a fall. She was evaluated here on 09/24/13. At that time a pelvis x-ray was done, that did not show acute injury or abnormality. She has known scoliosis. She has had similar low back pain in the past and prior falls. She was discharged from a rehabilitation facility about 10 days ago, after a stay for prolonged weakness following a hospital admission. She is using hydrocodone, and tramadol, at home without relief of her pain. Has been no fever, chills, nausea, vomiting, weakness, or dizziness. She continues to be able to ambulate at her home, using her walker, about 25 feet, up to 3 times a day. There are no other known modifying factors.  Patient is a 79 y.o. female presenting with shortness of breath and flank pain. The history is provided by the patient and a relative.  Shortness of Breath Flank Pain Associated symptoms include shortness of breath.    Past Medical History  Diagnosis Date  . Colitis   . Weight loss   . Diarrhea   . CAD (coronary artery disease)   . Hyperlipidemia   . Hypertension   . Asthma   . GERD (gastroesophageal reflux disease)   . Atrial fibrillation    Past Surgical History  Procedure Laterality Date  . Hernia repair      inguinal  . Coronary angioplasty with stent placement    . Bowel adhesions      several  . Laparotomy N/A 07/26/2013    Procedure: small bowel resection;  Surgeon: Ernestene Mention, MD;  Location: [facility];  Service: General;  Laterality: N/A;   Family History  Problem Relation Age of Onset  . Colon  cancer Father   . Breast cancer Sister   . Breast cancer Maternal Aunt    History  Substance Use Topics  . Smoking status: Former Smoker    Types: Cigarettes    Quit date: 12/30/1990  . Smokeless tobacco: Never Used  . Alcohol Use: No   OB History   Grav Para Term Preterm Abortions TAB SAB Ect Mult Living                 Review of Systems  Respiratory: Positive for shortness of breath.   Genitourinary: Positive for flank pain.  All other systems reviewed and are negative.    Allergies  Review of patient's allergies indicates no known allergies.  Home Medications   Current Outpatient Rx  Name  Route  Sig  Dispense  Refill  . amiodarone (PACERONE) 200 MG tablet   Oral   Take 1 tablet (200 mg total) by mouth 2 (two) times daily.   30 tablet   0   . aspirin 325 MG tablet   Oral   Take 1 tablet (325 mg total) by mouth daily.   30 tablet      . budesonide (ENTOCORT EC) 3 MG 24 hr capsule   Oral   Take 6 mg by mouth daily with lunch.         . famotidine (PEPCID) 20 MG tablet   Oral  Take 1 tablet (20 mg total) by mouth 2 (two) times daily.   30 tablet   0   . ferrous sulfate 325 (65 FE) MG tablet   Oral   Take 325 mg by mouth daily with breakfast.         . Fluticasone-Salmeterol (ADVAIR DISKUS) 100-50 MCG/DOSE AEPB   Inhalation   Inhale 1 puff into the lungs every 12 (twelve) hours.           . furosemide (LASIX) 20 MG tablet   Oral   Take 20 mg by mouth every morning.         Marland Kitchen HYDROcodone-acetaminophen (NORCO/VICODIN) 5-325 MG per tablet   Oral   Take 1 tablet by mouth every 6 (six) hours as needed for pain.   30 tablet   0   . lidocaine (LIDODERM) 5 %   Transdermal   Place 1 patch onto the skin daily. Remove & Discard patch within 12 hours or as directed by MD         . metoprolol tartrate (LOPRESSOR) 25 MG tablet   Oral   Take 12.5 mg by mouth 2 (two) times daily.         . Multiple Vitamin (MULTIVITAMIN WITH MINERALS) TABS  tablet   Oral   Take 1 tablet by mouth daily.         . potassium chloride (K-DUR,KLOR-CON) 20 MEQ tablet   Oral   Take 1 tablet (20 mEq total) by mouth 2 (two) times daily.         . promethazine (PHENERGAN) 25 MG tablet   Oral   Take 25 mg by mouth every 6 (six) hours as needed for nausea.         . traMADol (ULTRAM) 50 MG tablet   Oral   Take 50 mg by mouth every 6 (six) hours as needed. For pain relief         . triamcinolone cream (KENALOG) 0.1 %   Topical   Apply 1 application topically as needed (for itching (no more than once weekly)).          . valsartan (DIOVAN) 160 MG tablet   Oral   Take 80 mg by mouth 2 (two) times daily.           BP 107/61  Pulse 73  Temp(Src) 97.9 F (36.6 C) (Oral)  Resp 20  SpO2 98% Physical Exam  Nursing note and vitals reviewed. Constitutional: She is oriented to person, place, and time. She appears well-developed and well-nourished.  HENT:  Head: Normocephalic and atraumatic.  Eyes: Conjunctivae and EOM are normal. Pupils are equal, round, and reactive to light.  Neck: Normal range of motion and phonation normal. Neck supple.  Cardiovascular: Normal rate, regular rhythm and intact distal pulses.   Pulmonary/Chest: Effort normal and breath sounds normal. She exhibits no tenderness (No deformity or crepitation of the left ribs).  Abdominal: Soft. She exhibits no distension. There is no tenderness. There is no guarding.  Musculoskeletal: Normal range of motion.  Moderate thoracolumbar scoliosis. Tender left lumbar; without associated deformity. Negative straight-leg raising bilaterally. Normal range of motion. Hips, bilaterally.  Neurological: She is alert and oriented to person, place, and time. She exhibits normal muscle tone.  Skin: Skin is warm and dry.  Psychiatric: She has a normal mood and affect. Her behavior is normal. Judgment and thought content normal.    ED Course  Procedures (including critical care  time)  Medications  fentaNYL (SUBLIMAZE)  injection 50 mcg (50 mcg Intravenous Given 09/26/13 1358)  fentaNYL (DURAGESIC - dosed mcg/hr) 12.5 mcg (not administered)    3:10 PM Reevaluation with update and discussion. After initial assessment and treatment, an updated evaluation reveals she is somewhat more comfortable. The pt remains alert and interactive after a 50 mcg dose of Fentanyl. The family members state that they will try to place her, back into rehabilitation, for further treatment. Abhijot Straughter L   16:00- I discussed the treatment, with the low-dose (12 mcg) fentanyl patch, with the pharmacist. The patient is to be started on the low dose of fentanyl patch. She is not tolerant of her current treatment regimen. I believe that she can tolerate this patch because of the level of her pain and her prior experience with opiate analgesia.   Labs Review Labs Reviewed - No data to display Imaging Review Dg Lumbar Spine Complete  09/26/2013   *RADIOLOGY REPORT*  Clinical Data: Back pain  LUMBAR SPINE - COMPLETE 4+ VIEW  Comparison: Prior pelvic radiographs 09/24/2013; CT abdomen/pelvis 07/17/2013  Findings: Frontal, lateral and bilateral oblique radiographs of the lumbosacral spine demonstrate demonstrates multiple chronic- appearing compression fractures including T8, T9, T11, T12 and L3. Additionally, there is levoconvex scoliosis of the lumbar spine centered at L2 with lateral translation of L3-L4 and significant tilt of L4 on L5 with marked widening of the right disc space. Associated multilevel degenerative disc disease and facet arthropathy.  The bones are osteopenic.  Atherosclerotic calcifications noted throughout the aorto iliac system.  The visualized bony pelvis appears intact.  IMPRESSION:  1.  No definite acute fracture or malalignment. 2.  Numerous multilevel thoracic and lumbar compression fractures without significant interval change compared to prior imaging. 3.  Advanced multilevel  degenerative disc disease and lower lumbar facet arthropathy with advanced levoconvex scoliosis centered at L2. 4.  Aorto iliac atherosclerotic vascular calcifications.   Original Report Authenticated By: Malachy Moan, M.D.    MDM  No diagnosis found.   Back pain related to degenerative joint disease, scoliosis, and lumbar compression fractures. There are no acute changes in the lumbar spine. I doubt acute lumbar myelopathy as source of her pain. She is improved with treatment in the emergency department in stable for discharge.   Nursing Notes Reviewed/ Care Coordinated, and agree without changes. Applicable Imaging Reviewed.  Interpretation of Laboratory Data incorporated into ED treatment   Plan: Home Medications- Fentanyl patch; Home Treatments and Observation- rest; return here if the recommended treatment, does not improve the symptoms; Recommended follow up- PCP for further care asap, return here prn.      Flint Melter, MD 09/26/13 1606

## 2013-09-28 ENCOUNTER — Inpatient Hospital Stay (HOSPITAL_COMMUNITY): Payer: Medicare Other

## 2013-09-28 ENCOUNTER — Emergency Department (HOSPITAL_COMMUNITY): Payer: Medicare Other

## 2013-09-28 ENCOUNTER — Inpatient Hospital Stay (HOSPITAL_COMMUNITY)
Admission: EM | Admit: 2013-09-28 | Discharge: 2013-10-06 | DRG: 871 | Disposition: A | Discharge status: Hospice | Payer: Medicare Other | Attending: Internal Medicine | Admitting: Internal Medicine

## 2013-09-28 ENCOUNTER — Encounter (HOSPITAL_COMMUNITY): Payer: Self-pay

## 2013-09-28 DIAGNOSIS — J189 Pneumonia, unspecified organism: Secondary | ICD-10-CM | POA: Diagnosis present

## 2013-09-28 DIAGNOSIS — A4101 Sepsis due to Methicillin susceptible Staphylococcus aureus: Principal | ICD-10-CM | POA: Diagnosis present

## 2013-09-28 DIAGNOSIS — I359 Nonrheumatic aortic valve disorder, unspecified: Secondary | ICD-10-CM | POA: Diagnosis present

## 2013-09-28 DIAGNOSIS — R7881 Bacteremia: Secondary | ICD-10-CM

## 2013-09-28 DIAGNOSIS — E860 Dehydration: Secondary | ICD-10-CM

## 2013-09-28 DIAGNOSIS — M47817 Spondylosis without myelopathy or radiculopathy, lumbosacral region: Secondary | ICD-10-CM | POA: Diagnosis present

## 2013-09-28 DIAGNOSIS — I5022 Chronic systolic (congestive) heart failure: Secondary | ICD-10-CM | POA: Diagnosis present

## 2013-09-28 DIAGNOSIS — D649 Anemia, unspecified: Secondary | ICD-10-CM | POA: Diagnosis present

## 2013-09-28 DIAGNOSIS — R7401 Elevation of levels of liver transaminase levels: Secondary | ICD-10-CM | POA: Diagnosis present

## 2013-09-28 DIAGNOSIS — G8929 Other chronic pain: Secondary | ICD-10-CM | POA: Diagnosis present

## 2013-09-28 DIAGNOSIS — I4729 Other ventricular tachycardia: Secondary | ICD-10-CM | POA: Diagnosis present

## 2013-09-28 DIAGNOSIS — I4891 Unspecified atrial fibrillation: Secondary | ICD-10-CM | POA: Diagnosis present

## 2013-09-28 DIAGNOSIS — Z9889 Other specified postprocedural states: Secondary | ICD-10-CM

## 2013-09-28 DIAGNOSIS — I5021 Acute systolic (congestive) heart failure: Secondary | ICD-10-CM

## 2013-09-28 DIAGNOSIS — J9600 Acute respiratory failure, unspecified whether with hypoxia or hypercapnia: Secondary | ICD-10-CM | POA: Diagnosis present

## 2013-09-28 DIAGNOSIS — Z681 Body mass index (BMI) 19 or less, adult: Secondary | ICD-10-CM

## 2013-09-28 DIAGNOSIS — Z515 Encounter for palliative care: Secondary | ICD-10-CM

## 2013-09-28 DIAGNOSIS — I2510 Atherosclerotic heart disease of native coronary artery without angina pectoris: Secondary | ICD-10-CM | POA: Diagnosis present

## 2013-09-28 DIAGNOSIS — IMO0002 Reserved for concepts with insufficient information to code with codable children: Secondary | ICD-10-CM | POA: Diagnosis present

## 2013-09-28 DIAGNOSIS — M549 Dorsalgia, unspecified: Secondary | ICD-10-CM

## 2013-09-28 DIAGNOSIS — E871 Hypo-osmolality and hyponatremia: Secondary | ICD-10-CM | POA: Diagnosis present

## 2013-09-28 DIAGNOSIS — G9340 Encephalopathy, unspecified: Secondary | ICD-10-CM | POA: Diagnosis present

## 2013-09-28 DIAGNOSIS — E43 Unspecified severe protein-calorie malnutrition: Secondary | ICD-10-CM | POA: Diagnosis present

## 2013-09-28 DIAGNOSIS — I10 Essential (primary) hypertension: Secondary | ICD-10-CM | POA: Diagnosis present

## 2013-09-28 DIAGNOSIS — J690 Pneumonitis due to inhalation of food and vomit: Secondary | ICD-10-CM | POA: Diagnosis present

## 2013-09-28 DIAGNOSIS — R7989 Other specified abnormal findings of blood chemistry: Secondary | ICD-10-CM | POA: Diagnosis present

## 2013-09-28 DIAGNOSIS — W010XXA Fall on same level from slipping, tripping and stumbling without subsequent striking against object, initial encounter: Secondary | ICD-10-CM | POA: Diagnosis present

## 2013-09-28 DIAGNOSIS — R7402 Elevation of levels of lactic acid dehydrogenase (LDH): Secondary | ICD-10-CM | POA: Diagnosis present

## 2013-09-28 DIAGNOSIS — Z66 Do not resuscitate: Secondary | ICD-10-CM | POA: Diagnosis present

## 2013-09-28 DIAGNOSIS — Z87891 Personal history of nicotine dependence: Secondary | ICD-10-CM

## 2013-09-28 DIAGNOSIS — A419 Sepsis, unspecified organism: Secondary | ICD-10-CM | POA: Diagnosis present

## 2013-09-28 DIAGNOSIS — R636 Underweight: Secondary | ICD-10-CM | POA: Diagnosis present

## 2013-09-28 DIAGNOSIS — R627 Adult failure to thrive: Secondary | ICD-10-CM | POA: Diagnosis present

## 2013-09-28 DIAGNOSIS — Z9049 Acquired absence of other specified parts of digestive tract: Secondary | ICD-10-CM

## 2013-09-28 DIAGNOSIS — I472 Ventricular tachycardia, unspecified: Secondary | ICD-10-CM | POA: Diagnosis present

## 2013-09-28 DIAGNOSIS — Z7982 Long term (current) use of aspirin: Secondary | ICD-10-CM

## 2013-09-28 DIAGNOSIS — I509 Heart failure, unspecified: Secondary | ICD-10-CM

## 2013-09-28 DIAGNOSIS — Y92009 Unspecified place in unspecified non-institutional (private) residence as the place of occurrence of the external cause: Secondary | ICD-10-CM

## 2013-09-28 DIAGNOSIS — I350 Nonrheumatic aortic (valve) stenosis: Secondary | ICD-10-CM | POA: Diagnosis present

## 2013-09-28 DIAGNOSIS — K560 Paralytic ileus: Secondary | ICD-10-CM | POA: Diagnosis present

## 2013-09-28 DIAGNOSIS — M4120 Other idiopathic scoliosis, site unspecified: Secondary | ICD-10-CM | POA: Diagnosis present

## 2013-09-28 LAB — BASIC METABOLIC PANEL
BUN: 32 mg/dL — ABNORMAL HIGH (ref 6–23)
CO2: 19 mEq/L (ref 19–32)
Calcium: 8.2 mg/dL — ABNORMAL LOW (ref 8.4–10.5)
Chloride: 91 mEq/L — ABNORMAL LOW (ref 96–112)
Creatinine, Ser: 0.7 mg/dL (ref 0.50–1.10)
GFR calc Af Amer: 88 mL/min — ABNORMAL LOW (ref >90)
GFR calc non Af Amer: 76 mL/min — ABNORMAL LOW (ref >90)
Glucose, Bld: 101 mg/dL — ABNORMAL HIGH (ref 70–99)
Potassium: 4.7 mEq/L (ref 3.5–5.1)
Sodium: 123 mEq/L — ABNORMAL LOW (ref 135–145)

## 2013-09-28 LAB — URINALYSIS, ROUTINE W REFLEX MICROSCOPIC
Bilirubin Urine: NEGATIVE
Glucose, UA: NEGATIVE mg/dL
Ketones, ur: NEGATIVE mg/dL
Leukocytes, UA: NEGATIVE
Nitrite: NEGATIVE
Protein, ur: 30 mg/dL — AB
Specific Gravity, Urine: 1.021 (ref 1.005–1.030)
Urobilinogen, UA: 1 mg/dL (ref 0.0–1.0)
pH: 5.5 (ref 5.0–8.0)

## 2013-09-28 LAB — CBC WITH DIFFERENTIAL/PLATELET
Basophils Absolute: 0 10*3/uL (ref 0.0–0.1)
Basophils Relative: 0 % (ref 0–1)
Eosinophils Absolute: 0 10*3/uL (ref 0.0–0.7)
Eosinophils Relative: 0 % (ref 0–5)
HCT: 30.3 % — ABNORMAL LOW (ref 36.0–46.0)
Hemoglobin: 10.1 g/dL — ABNORMAL LOW (ref 12.0–15.0)
Lymphocytes Relative: 4 % — ABNORMAL LOW (ref 12–46)
Lymphs Abs: 0.5 10*3/uL — ABNORMAL LOW (ref 0.7–4.0)
MCH: 28 pg (ref 26.0–34.0)
MCHC: 33.3 g/dL (ref 30.0–36.0)
MCV: 83.9 fL (ref 78.0–100.0)
Monocytes Absolute: 1 10*3/uL (ref 0.1–1.0)
Monocytes Relative: 8 % (ref 3–12)
Neutro Abs: 11.1 10*3/uL — ABNORMAL HIGH (ref 1.7–7.7)
Neutrophils Relative %: 88 % — ABNORMAL HIGH (ref 43–77)
Platelets: 258 10*3/uL (ref 150–400)
RBC: 3.61 MIL/uL — ABNORMAL LOW (ref 3.87–5.11)
RDW: 19.7 % — ABNORMAL HIGH (ref 11.5–15.5)
WBC: 12.6 10*3/uL — ABNORMAL HIGH (ref 4.0–10.5)

## 2013-09-28 LAB — PRO B NATRIURETIC PEPTIDE: Pro B Natriuretic peptide (BNP): 27885 pg/mL — ABNORMAL HIGH (ref 0–450)

## 2013-09-28 LAB — URINE MICROSCOPIC-ADD ON

## 2013-09-28 MED ORDER — DEXTROSE 5 % IV SOLN
1.0000 g | INTRAVENOUS | Status: DC
  Filled 2013-09-28: qty 1

## 2013-09-28 MED ORDER — SODIUM CHLORIDE 0.9 % IV SOLN
INTRAVENOUS | Status: DC
  Administered 2013-09-28: 18:00:00 100 mL/h via INTRAVENOUS
  Administered 2013-09-29 – 2013-09-30 (×3): via INTRAVENOUS

## 2013-09-28 MED ORDER — DEXTROSE 5 % IV SOLN
1.0000 g | Freq: Once | INTRAVENOUS | Status: AC
  Administered 2013-09-28: 1 g via INTRAVENOUS
  Filled 2013-09-28: qty 1

## 2013-09-28 MED ORDER — SODIUM CHLORIDE 0.9 % IJ SOLN
3.0000 mL | Freq: Two times a day (BID) | INTRAMUSCULAR | Status: DC
  Administered 2013-09-30 – 2013-10-04 (×8): 3 mL via INTRAVENOUS

## 2013-09-28 MED ORDER — FAMOTIDINE 20 MG PO TABS
20.0000 mg | ORAL_TABLET | Freq: Two times a day (BID) | ORAL | Status: DC
  Administered 2013-09-28 – 2013-10-06 (×16): 20 mg via ORAL
  Filled 2013-09-28 (×19): qty 1

## 2013-09-28 MED ORDER — ALBUTEROL SULFATE (5 MG/ML) 0.5% IN NEBU
2.5000 mg | INHALATION_SOLUTION | Freq: Four times a day (QID) | RESPIRATORY_TRACT | Status: DC
  Administered 2013-09-28 – 2013-10-01 (×13): 2.5 mg via RESPIRATORY_TRACT
  Filled 2013-09-28 (×13): qty 0.5

## 2013-09-28 MED ORDER — VANCOMYCIN HCL IN DEXTROSE 750-5 MG/150ML-% IV SOLN: 750.0000 mg | INTRAVENOUS | Status: DC

## 2013-09-28 MED ORDER — ONDANSETRON HCL 4 MG/2ML IJ SOLN
4.0000 mg | Freq: Four times a day (QID) | INTRAMUSCULAR | Status: DC | PRN
  Administered 2013-09-30: 4 mg via INTRAVENOUS
  Filled 2013-09-28: qty 2

## 2013-09-28 MED ORDER — ALBUTEROL SULFATE (5 MG/ML) 0.5% IN NEBU: 2.5000 mg | INHALATION_SOLUTION | RESPIRATORY_TRACT | Status: DC | PRN

## 2013-09-28 MED ORDER — ACETAMINOPHEN 325 MG PO TABS: 650.0000 mg | ORAL_TABLET | Freq: Four times a day (QID) | ORAL | Status: DC | PRN

## 2013-09-28 MED ORDER — ACETAMINOPHEN 650 MG RE SUPP: 650.0000 mg | Freq: Four times a day (QID) | RECTAL | Status: DC | PRN

## 2013-09-28 MED ORDER — HEPARIN SODIUM (PORCINE) 5000 UNIT/ML IJ SOLN
5000.0000 [IU] | Freq: Three times a day (TID) | INTRAMUSCULAR | Status: DC
  Administered 2013-09-28 – 2013-10-06 (×21): 5000 [IU] via SUBCUTANEOUS
  Filled 2013-09-28 (×27): qty 1

## 2013-09-28 MED ORDER — ASPIRIN 325 MG PO TABS
325.0000 mg | ORAL_TABLET | Freq: Every day | ORAL | Status: DC
  Administered 2013-09-29 – 2013-10-06 (×8): 325 mg via ORAL
  Filled 2013-09-28 (×8): qty 1

## 2013-09-28 MED ORDER — BUDESONIDE 3 MG PO CP24
6.0000 mg | ORAL_CAPSULE | Freq: Every day | ORAL | Status: DC
  Administered 2013-09-29 – 2013-10-06 (×8): 6 mg via ORAL
  Filled 2013-09-28 (×8): qty 2

## 2013-09-28 MED ORDER — AMIODARONE HCL 200 MG PO TABS
200.0000 mg | ORAL_TABLET | Freq: Two times a day (BID) | ORAL | Status: DC
  Administered 2013-09-29 – 2013-10-06 (×15): 200 mg via ORAL
  Filled 2013-09-28 (×17): qty 1

## 2013-09-28 MED ORDER — VANCOMYCIN HCL IN DEXTROSE 750-5 MG/150ML-% IV SOLN
750.0000 mg | INTRAVENOUS | Status: DC
  Administered 2013-09-29 – 2013-10-01 (×3): 750 mg via INTRAVENOUS
  Filled 2013-09-28 (×4): qty 150

## 2013-09-28 MED ORDER — MOMETASONE FURO-FORMOTEROL FUM 100-5 MCG/ACT IN AERO
2.0000 | INHALATION_SPRAY | Freq: Two times a day (BID) | RESPIRATORY_TRACT | Status: DC
  Administered 2013-09-29 – 2013-10-06 (×15): 2 via RESPIRATORY_TRACT
  Filled 2013-09-28: qty 8.8

## 2013-09-28 MED ORDER — SODIUM CHLORIDE 0.9 % IV BOLUS (SEPSIS)
1000.0000 mL | Freq: Once | INTRAVENOUS | Status: AC
  Administered 2013-09-28: 1000 mL via INTRAVENOUS

## 2013-09-28 MED ORDER — HYDROCODONE-ACETAMINOPHEN 5-325 MG PO TABS
1.0000 | ORAL_TABLET | Freq: Four times a day (QID) | ORAL | Status: DC | PRN
  Administered 2013-09-30 – 2013-10-02 (×9): 1 via ORAL
  Filled 2013-09-28 (×9): qty 1

## 2013-09-28 MED ORDER — DEXTROSE 5 % IV SOLN
1.0000 g | INTRAVENOUS | Status: DC
  Administered 2013-09-29 – 2013-10-04 (×6): 1 g via INTRAVENOUS
  Filled 2013-09-28 (×6): qty 1

## 2013-09-28 MED ORDER — VANCOMYCIN HCL IN DEXTROSE 1-5 GM/200ML-% IV SOLN: 1000.0000 mg | INTRAVENOUS | Status: DC

## 2013-09-28 MED ORDER — VANCOMYCIN HCL IN DEXTROSE 1-5 GM/200ML-% IV SOLN
1000.0000 mg | INTRAVENOUS | Status: AC
  Administered 2013-09-28: 1000 mg via INTRAVENOUS
  Filled 2013-09-28: qty 200

## 2013-09-28 MED ORDER — ONDANSETRON HCL 4 MG PO TABS: 4.0000 mg | ORAL_TABLET | Freq: Four times a day (QID) | ORAL | Status: DC | PRN

## 2013-09-28 NOTE — Progress Notes (Signed)
Attempted to call and get report at 1545, ED NS stated that the nurse was in another patient procedure. ED NS stated she would have ED nurse call me back.  Direct phone number given. Lester Platas A

## 2013-09-28 NOTE — Progress Notes (Signed)
Gave patient aero chamber

## 2013-09-28 NOTE — ED Provider Notes (Signed)
CSN: [id number]     Arrival date & time 09/28/13  1131 History   First MD Initiated Contact with Patient 09/28/13 1136     Chief Complaint  Patient presents with  . Fall  . Shortness of Breath   (Consider location/radiation/quality/duration/timing/severity/associated sxs/prior Treatment) HPI  89yF brought in by daughter for evaluation of increasing weakness, confusion and a fall to her knees earlier today. [facility]net hx significant for extended admission this past July/August for small bowel obstruction. She underwent surgery and lysis of adhesions. She was on TPN. Multiple complications including MSSA bacteremia related to PICC, heart failure and tachydysrhythmia's. Discharged to acute rehab and then home about a week ago. Has been increasingly weaker since then. Has fallen twice and seen in the ED for the same a few days ago. Increasing confusion over past few days which has seemed to coincide with starting fentanyl patch for back pain. Also worsening SOB and cough. No fever. No v/d.    Past Medical History  Diagnosis Date  . Colitis   . Weight loss   . Diarrhea   . CAD (coronary artery disease)   . Hyperlipidemia   . Hypertension   . Asthma   . GERD (gastroesophageal reflux disease)   . Atrial fibrillation    Past Surgical History  Procedure Laterality Date  . Hernia repair      inguinal  . Coronary angioplasty with stent placement    . Bowel adhesions      several  . Laparotomy N/A 07/26/2013    Procedure: small bowel resection;  Surgeon: Ernestene Mention, MD;  Location: [facility];  Service: General;  Laterality: N/A;   Family History  Problem Relation Age of Onset  . Colon cancer Father   . Breast cancer Sister   . Breast cancer Maternal Aunt    History  Substance Use Topics  . Smoking status: Former Smoker    Types: Cigarettes    Quit date: 12/30/1990  . Smokeless tobacco: Never Used  . Alcohol Use: No   OB History   Grav Para Term Preterm Abortions TAB SAB Ect Mult  Living                 Review of Systems  All systems reviewed and negative, other than as noted in HPI.  Allergies  Review of patient's allergies indicates no known allergies.  Home Medications   Current Outpatient Rx  Name  Route  Sig  Dispense  Refill  . amiodarone (PACERONE) 200 MG tablet   Oral   Take 1 tablet (200 mg total) by mouth 2 (two) times daily.   30 tablet   0   . aspirin 325 MG tablet   Oral   Take 1 tablet (325 mg total) by mouth daily.   30 tablet      . budesonide (ENTOCORT EC) 3 MG 24 hr capsule   Oral   Take 6 mg by mouth daily with lunch.         . famotidine (PEPCID) 20 MG tablet   Oral   Take 1 tablet (20 mg total) by mouth 2 (two) times daily.   30 tablet   0   . fentaNYL (DURAGESIC - DOSED MCG/HR) 12 MCG/HR   Transdermal   Place 1 patch (12.5 mcg total) onto the skin every 3 (three) days.   5 patch   0   . ferrous sulfate 325 (65 FE) MG tablet   Oral   Take 325 mg by mouth  daily with breakfast.         . Fluticasone-Salmeterol (ADVAIR DISKUS) 100-50 MCG/DOSE AEPB   Inhalation   Inhale 1 puff into the lungs every 12 (twelve) hours.           . furosemide (LASIX) 20 MG tablet   Oral   Take 20 mg by mouth every morning.         Marland Kitchen HYDROcodone-acetaminophen (NORCO/VICODIN) 5-325 MG per tablet   Oral   Take 1 tablet by mouth every 6 (six) hours as needed for pain.   30 tablet   0   . lidocaine (LIDODERM) 5 %   Transdermal   Place 1 patch onto the skin daily. Remove & Discard patch within 12 hours or as directed by MD         . metoprolol tartrate (LOPRESSOR) 25 MG tablet   Oral   Take 12.5 mg by mouth 2 (two) times daily.         . Multiple Vitamin (MULTIVITAMIN WITH MINERALS) TABS tablet   Oral   Take 1 tablet by mouth daily.         . potassium chloride (K-DUR,KLOR-CON) 20 MEQ tablet   Oral   Take 1 tablet (20 mEq total) by mouth 2 (two) times daily.         . promethazine (PHENERGAN) 25 MG tablet    Oral   Take 25 mg by mouth every 6 (six) hours as needed for nausea.         . traMADol (ULTRAM) 50 MG tablet   Oral   Take 50 mg by mouth every 6 (six) hours as needed. For pain relief         . triamcinolone cream (KENALOG) 0.1 %   Topical   Apply 1 application topically as needed (for itching (no more than once weekly)).          . valsartan (DIOVAN) 160 MG tablet   Oral   Take 80 mg by mouth 2 (two) times daily.           BP 94/56  Pulse 67  Temp(Src) 98.4 F (36.9 C) (Oral)  Resp 12  SpO2 100% Physical Exam  Nursing note and vitals reviewed. Constitutional: She appears well-developed and well-nourished.  Frail and chronically ill appearing  HENT:  Head: Normocephalic and atraumatic.  Eyes: Pupils are equal, round, and reactive to light. Right eye exhibits no discharge. Left eye exhibits no discharge.  Neck: Normal range of motion. Neck supple.  Cardiovascular: Normal rate, regular rhythm and normal heart sounds.  Exam reveals no gallop and no friction rub.   No murmur heard. Pulmonary/Chest: No respiratory distress. She has rales. She exhibits no tenderness.  Abdominal: Soft. She exhibits no distension. There is no tenderness.  Musculoskeletal: She exhibits no edema and no tenderness.  Neurological: No cranial nerve deficit. She exhibits normal muscle tone.  Oriented to self and place. Speech clear but content inappropriate. Skin tears/abrasions to b/l knees. Lower legs wrapper. No apparent pain with ROM at knees or hips. Global weakness. No focal deficit noted.   Skin: Skin is warm and dry. She is not diaphoretic.    ED Course  Procedures (including critical care time) Labs Review Labs Reviewed  CBC WITH DIFFERENTIAL - Abnormal; Notable for the following:    WBC 12.6 (*)    RBC 3.61 (*)    Hemoglobin 10.1 (*)    HCT 30.3 (*)    RDW 19.7 (*)  Neutrophils Relative % 88 (*)    Neutro Abs 11.1 (*)    Lymphocytes Relative 4 (*)    Lymphs Abs 0.5 (*)     All other components within normal limits  BASIC METABOLIC PANEL - Abnormal; Notable for the following:    Sodium 123 (*)    Chloride 91 (*)    Glucose, Bld 101 (*)    BUN 32 (*)    Calcium 8.2 (*)    GFR calc non Af Amer 76 (*)    GFR calc Af Amer 88 (*)    All other components within normal limits  PRO B NATRIURETIC PEPTIDE - Abnormal; Notable for the following:    Pro B Natriuretic peptide (BNP) 27885.0 (*)    All other components within normal limits  URINALYSIS, ROUTINE W REFLEX MICROSCOPIC   Imaging Review Dg Chest 2 View  09/28/2013   CLINICAL DATA:  Cough, COPD  EXAM: CHEST  2 VIEW  COMPARISON:  07/27/2013  FINDINGS: Chronic position markings carcinoma changes. Mild left basilar opacity, likely a combination of atelectasis and small left pleural effusion. No pneumothorax.  The heart is normal in size.  Discussion thoracolumbar scoliosis.  IMPRESSION: Mild left basilar opacity, likely a combination of atelectasis and small left pleural effusion.   Electronically Signed   By: Charline Bills M.D.   On: 09/28/2013 13:16   Dg Knee Complete 4 Views Left  09/28/2013   CLINICAL DATA:  Fall, left knee abrasion  EXAM: LEFT KNEE - COMPLETE 4+ VIEW  COMPARISON:  None.  FINDINGS: No fracture or dislocation is seen.  Joint spaces are essentially preserved.  No suprapatellar knee joint effusion.  Vascular calcifications.  IMPRESSION: No fracture or dislocation is seen.   Electronically Signed   By: Charline Bills M.D.   On: 09/28/2013 13:18   EKG:  Rhythm: normal sinus Vent. rate 66 BPM PR interval 199 ms QRS duration 110 ms QT/QTc 447/469 ms ST segments: normal  MDM   1. Dehydration   2. Hyponatremia   3. HCAP (healthcare-associated pneumonia)   4. Heart failure    89yF with multiple issues and likely multifactorial. Deconditioning with recent illness/hospitalization. Poor appetitive/dehydration. Pain medications likely contributing to confusion. Some of these issues may  potentially correctable with hospitalization. Suspect pt will ultimately need placement though. Discussed with medicine for admission.     Raeford Razor, MD 10/02/13 1304

## 2013-09-28 NOTE — ED Notes (Signed)
Bed: WU98 Expected date:  Expected time:  Means of arrival:  Comments: 79 y/o F fall

## 2013-09-28 NOTE — H&P (Signed)
[facility] History and Physical  Ariel Hunt BMW:[id number] DOB: 03-07-1923 DOA: 09/28/2013   PCP: Daisy Floro, MD  Specialists: Dr. Anne Fu is her cardiologist. She was operated on by Dr. Derrell Lolling on July 28 for small bowel obstruction  Chief Complaint: Worsening shortness of breath  HPI: Ariel Hunt is a 79 y.o. female with a past medical history of atrial fibrillation, coronary artery disease, severe aortic stenosis, who was hospitalized back in July and stayed up until early August for small bowel obstruction. She underwent surgery and lysis of adhesions. She was on TPN. She was also found to have MSSA bacteremia related to PICC line infection. She was on IV antibiotics up until August 11 with Ancef. She also developed fluid overload during this hospitalization requiring Lasix. Then she developed tachyarrhythmias with ventricle tachycardia SVT and A. fib. She was initially on amiodarone infusion and IV metoprolol. She has not been started on anticoagulation as yet. She was subsequently sent to rehabilitation. She is accompanied by her daughter today. She came back home from rehabilitation Center about one week ago. She's had 2 falls during this time. She's been in the ED, twice in the last 5 days. She fell on her knees a few days ago. She presented to the ED, underwent x-rays. No fractures were noted, and she was sent back home. According to the daughter she has developed worsening shortness of breath over the last 3-4 days. She's coughing up whitish expectoration. Denies any fever or chills. No nausea, or vomiting. She's lost about 5 pounds in the last one week. Patient has had poor appetite and has been getting progressively weaker as well. Patient was visited by her home health nurse, who recommended that she be brought into the emergency room. Patient also has chronic back pain, secondary to scoliosis. And she was recently started on a fentanyl patch. And according to the daughter that  has been contributing to her confusion and she has been hallucinating at home. When she was brought into the ED, she was found to be mildly hypoxic between 86-89%. With, oxygen she's come up to mid 90s. Patient is very confused and unable to provide any history.  Home Medications: Prior to Admission medications   Medication Sig Start Date End Date Taking? Authorizing Provider  amiodarone (PACERONE) 200 MG tablet Take 1 tablet (200 mg total) by mouth 2 (two) times daily. 08/02/13  Yes Zannie Cove, MD  aspirin 325 MG tablet Take 1 tablet (325 mg total) by mouth daily. 08/02/13  Yes Zannie Cove, MD  budesonide (ENTOCORT EC) 3 MG 24 hr capsule Take 6 mg by mouth daily with lunch.   Yes Historical Provider, MD  famotidine (PEPCID) 20 MG tablet Take 1 tablet (20 mg total) by mouth 2 (two) times daily. 08/02/13  Yes Zannie Cove, MD  fentaNYL (DURAGESIC - DOSED MCG/HR) 12 MCG/HR Place 1 patch (12.5 mcg total) onto the skin every 3 (three) days. 09/26/13  Yes Flint Melter, MD  ferrous sulfate 325 (65 FE) MG tablet Take 325 mg by mouth daily with breakfast.   Yes Historical Provider, MD  Fluticasone-Salmeterol (ADVAIR DISKUS) 100-50 MCG/DOSE AEPB Inhale 1 puff into the lungs every 12 (twelve) hours.     Yes Historical Provider, MD  furosemide (LASIX) 20 MG tablet Take 20 mg by mouth every morning.   Yes Historical Provider, MD  HYDROcodone-acetaminophen (NORCO/VICODIN) 5-325 MG per tablet Take 1 tablet by mouth every 6 (six) hours as needed for pain. 08/02/13  Yes Preetha  Jomarie Longs, MD  lidocaine (LIDODERM) 5 % Place 1 patch onto the skin daily. Remove & Discard patch within 12 hours or as directed by MD   Yes Historical Provider, MD  metoprolol tartrate (LOPRESSOR) 25 MG tablet Take 12.5 mg by mouth 2 (two) times daily.   Yes Historical Provider, MD  Multiple Vitamin (MULTIVITAMIN WITH MINERALS) TABS tablet Take 1 tablet by mouth daily.   Yes Historical Provider, MD  potassium chloride (K-DUR,KLOR-CON) 20  MEQ tablet Take 1 tablet (20 mEq total) by mouth 2 (two) times daily. 08/02/13  Yes Zannie Cove, MD  promethazine (PHENERGAN) 25 MG tablet Take 25 mg by mouth every 6 (six) hours as needed for nausea.   Yes Historical Provider, MD  traMADol (ULTRAM) 50 MG tablet Take 50 mg by mouth every 6 (six) hours as needed. For pain relief   Yes Historical Provider, MD  triamcinolone cream (KENALOG) 0.1 % Apply 1 application topically as needed (for itching (no more than once weekly)).    Yes Historical Provider, MD  valsartan (DIOVAN) 160 MG tablet Take 80 mg by mouth 2 (two) times daily.    Yes Historical Provider, MD    Allergies: No Known Allergies  Past Medical History: Past Medical History  Diagnosis Date  . Colitis   . Weight loss   . Diarrhea   . CAD (coronary artery disease)   . Hyperlipidemia   . Hypertension   . Asthma   . GERD (gastroesophageal reflux disease)   . Atrial fibrillation     Past Surgical History  Procedure Laterality Date  . Hernia repair      inguinal  . Coronary angioplasty with stent placement    . Bowel adhesions      several  . Laparotomy N/A 07/26/2013    Procedure: small bowel resection;  Surgeon: Ernestene Mention, MD;  Location: [facility];  Service: General;  Laterality: N/A;    Social History:  reports that she quit smoking about 22 years ago. Her smoking use included Cigarettes. She smoked 0.00 packs per day. She has never used smokeless tobacco. She reports that she does not drink alcohol or use illicit drugs.  Living Situation: She lives in her own house, but has caregivers around-the-clock. Activity Level: Has a walker to ambulate.   Family History:  Family History  Problem Relation Age of Onset  . Colon cancer Father   . Breast cancer Sister   . Breast cancer Maternal Aunt      Review of Systems - Unable to obtain due to confusion  Physical Examination  Filed Vitals:   09/28/13 1146  BP: 94/56  Pulse: 67  Temp: 98.4 F (36.9 C)   TempSrc: Oral  Resp: 12  SpO2: 100%    General appearance: alert, cachectic, distracted, no distress and slowed mentation Head: Normocephalic, without obvious abnormality, atraumatic Eyes: conjunctivae/corneas clear. PERRL, EOM's intact. Throat: very dry mucus membranes Neck: no adenopathy, no carotid bruit, no JVD, supple, symmetrical, trachea midline and thyroid not enlarged, symmetric, no tenderness/mass/nodules Resp: She is end expiratory wheezing bilaterally. She has rhonchi in the left base. Crackles are present as well. Cardio: regular rate and rhythm, S1, S2 normal, no murmur, click, rub or gallop GI: soft, non-tender; bowel sounds normal; no masses,  no organomegaly Extremities: covered with a wrap. This was changed just 2 daysa go per daughter. No wounds at this time per daughter. Both knees have good ROM. Pulses: 2+ and symmetric Skin: Skin tears noted on both knees. Lymph  nodes: Cervical, supraclavicular, and axillary nodes normal. Neurologic: She is alert. Confused. Oriented only to daughter. No cranial nerve deficits. Motor strength is equal bil Upper and Lower extremities.  Laboratory Data: Results for orders placed during the hospital encounter of 09/28/13 (from the past 48 hour(s))  CBC WITH DIFFERENTIAL     Status: Abnormal   Collection Time    09/28/13  1:15 PM      Result Value Range   WBC 12.6 (*) 4.0 - 10.5 K/uL   RBC 3.61 (*) 3.87 - 5.11 MIL/uL   Hemoglobin 10.1 (*) 12.0 - 15.0 g/dL   HCT 16.1 (*) 09.6 - 04.5 %   MCV 83.9  78.0 - 100.0 fL   MCH 28.0  26.0 - 34.0 pg   MCHC 33.3  30.0 - 36.0 g/dL   RDW 40.9 (*) 81.1 - 91.4 %   Platelets 258  150 - 400 K/uL   Neutrophils Relative % 88 (*) 43 - 77 %   Neutro Abs 11.1 (*) 1.7 - 7.7 K/uL   Lymphocytes Relative 4 (*) 12 - 46 %   Lymphs Abs 0.5 (*) 0.7 - 4.0 K/uL   Monocytes Relative 8  3 - 12 %   Monocytes Absolute 1.0  0.1 - 1.0 K/uL   Eosinophils Relative 0  0 - 5 %   Eosinophils Absolute 0.0  0.0 - 0.7  K/uL   Basophils Relative 0  0 - 1 %   Basophils Absolute 0.0  0.0 - 0.1 K/uL  BASIC METABOLIC PANEL     Status: Abnormal   Collection Time    09/28/13  1:15 PM      Result Value Range   Sodium 123 (*) 135 - 145 mEq/L   Potassium 4.7  3.5 - 5.1 mEq/L   Chloride 91 (*) 96 - 112 mEq/L   CO2 19  19 - 32 mEq/L   Glucose, Bld 101 (*) 70 - 99 mg/dL   BUN 32 (*) 6 - 23 mg/dL   Creatinine, Ser 7.82  0.50 - 1.10 mg/dL   Calcium 8.2 (*) 8.4 - 10.5 mg/dL   GFR calc non Af Amer 76 (*) >90 mL/min   GFR calc Af Amer 88 (*) >90 mL/min   Comment: (NOTE)     The eGFR has been calculated using the CKD EPI equation.     This calculation has not been validated in all clinical situations.     eGFR's persistently <90 mL/min signify possible Chronic Kidney     Disease.  PRO B NATRIURETIC PEPTIDE     Status: Abnormal   Collection Time    09/28/13  2:26 PM      Result Value Range   Pro B Natriuretic peptide (BNP) 27885.0 (*) 0 - 450 pg/mL    Radiology Reports: Dg Chest 2 View  09/28/2013   CLINICAL DATA:  Cough, COPD  EXAM: CHEST  2 VIEW  COMPARISON:  07/27/2013  FINDINGS: Chronic position markings carcinoma changes. Mild left basilar opacity, likely a combination of atelectasis and small left pleural effusion. No pneumothorax.  The heart is normal in size.  Discussion thoracolumbar scoliosis.  IMPRESSION: Mild left basilar opacity, likely a combination of atelectasis and small left pleural effusion.   Electronically Signed   By: Charline Bills M.D.   On: 09/28/2013 13:16   Dg Knee Complete 4 Views Left  09/28/2013   CLINICAL DATA:  Fall, left knee abrasion  EXAM: LEFT KNEE - COMPLETE 4+ VIEW  COMPARISON:  None.  FINDINGS: No fracture or dislocation is seen.  Joint spaces are essentially preserved.  No suprapatellar knee joint effusion.  Vascular calcifications.  IMPRESSION: No fracture or dislocation is seen.   Electronically Signed   By: Charline Bills M.D.   On: 09/28/2013 13:18     Electrocardiogram: Sinus rhythm at 66 beats per minute. Normal axis. Intervals are normal. No Q waves. No concerning ST or T-wave changes.  Problem List  Principal Problem:   HCAP (healthcare-associated pneumonia) Active Problems:   CAD (coronary artery disease)   Anemia   Aortic stenosis, severe   Ventricular tachycardia   Atrial fibrillation   Hyponatremia   FTT (failure to thrive) in adult   Dehydration   Acute encephalopathy   Assessment: This is 79 year old, Caucasian female, who presents with progressively worsening dyspnea. She is very dehydrated, with severe hyponatremia. It's possible she has left lower lobe pneumonia. Her abdomen appears to be benign. It doesn't appear that she has a recurrence of her obstruction.  Plan: #1 dyspnea with hypoxia and acute respiratory failure with possible healthcare associated pneumonia: Continue oxygen. We will place her on broad-spectrum antibiotics with vancomycin and cefepime. Swallow evaluation will be obtained. Nebulizer treatments will be provided. At this time we will hold off on steroids. This can be considered if there is no improvement.  #2 severe dehydration with hyponatremia and failure to thrive: Continue with normal saline infusion. Monitor sodium levels closely. Hyponatremia is most likely due to hypovolemia. Monitor urine output. We will place a Foley catheter.  #3 history of coronary artery disease, Chronic systolic CHF and severe aortic stenosis: These issues appear to be stable. EKG does not show any ischemic changes. EF is 40-45%. Caution with IVF.  #4 history of tachyarrhythmias including V tach, as well as atrial fibrillation: Continue with amiodarone. Monitor her on telemetry. She is not on anticoagulation and just aspirin.  #5 acute encephalopathy: This is most likely due to the metabolic derangements as well as infectious process. Fentanyl patch may have contributed and has been discontinued for now.  #6 chronic  pain due to chronic back issues and arthritis: Resume pain medication as soon as her mental status is back to baseline. We will need to try alternative agents.  #7 history of hypertension: Blood pressure is low normal. We will hold off on all of her antihypertensive medications, except for the amiodarone. Holding parameters will be utilized. Blood pressure will be monitored closely.  #8 recent small bowel obstruction status post surgical intervention: She does have minimal abdominal discomfort, but abdomen is benign. We will proceed with the 2 view abdominal x-rays.  #9 anemia: Hemoglobin is close to baseline.  #10. Chronic lower extremity wounds: Daughter tell me that the wraps were changed just 3 days ago. She tells me that the wounds have healed up. Continue current management. Consult wound care.  Have advised the patient's daughter, that patient seems to be declining. I have told her that it would be reasonable to try the above measures. But if there is no improvement I strongly recommended palliative medicine consultation. This can be discussed further in the next day or so depending on patient's clinical condition.  DVT Prophylaxis: Heparin Code Status: CODE STATUS was discussed in detail with the patient's daughter. She is agreeable only to Vasopressors for hypotension if needed. Otherwise, she does not want CPR, intubation, mechanical ventilation or any other heroic measures, including defibrillation, pacemakers et Karie Soda. So, she'll be a partial code. Family Communication: Discussed in detail with  the patient's daughter  Disposition Plan: Admit to telemetry   Further management decisions will depend on results of further testing and patient's response to treatment.  [facility]  [facility] Pager [phone]  If 7PM-7AM, please contact night-coverage [email or web address] Password [facility]  09/28/2013, 3:43 PM

## 2013-09-28 NOTE — Progress Notes (Signed)
Utilization Review completed.  Azalia Neuberger RN CM  

## 2013-09-28 NOTE — ED Notes (Signed)
Per EMS pt from home, states fell this am while getting in the car. States pt has SOB, audbile wheezing, states has had a fentanyl patch on since Saturday causing ALOC

## 2013-09-28 NOTE — Progress Notes (Signed)
ANTIBIOTIC CONSULT NOTE - INITIAL  Pharmacy Consult for vancomycin Indication: pneumonia  No Known Allergies  Patient Measurements:   Adjusted Body Weight:   Vital Signs: Temp: 98.4 F (36.9 C) (09/30 1146) Temp src: Oral (09/30 1146) BP: 94/56 mmHg (09/30 1146) Pulse Rate: 67 (09/30 1146) Intake/Output from previous day:   Intake/Output from this shift:    Labs:  Recent Labs  09/28/13 1315  WBC 12.6*  HGB 10.1*  PLT 258  CREATININE 0.70   The CrCl is unknown because both a height and weight (above a minimum accepted value) are required for this calculation. No results found for this basename: VANCOTROUGH, VANCOPEAK, VANCORANDOM, GENTTROUGH, GENTPEAK, GENTRANDOM, TOBRATROUGH, TOBRAPEAK, TOBRARND, AMIKACINPEAK, AMIKACINTROU, AMIKACIN,  in the last 72 hours   Microbiology: No results found for this or any previous visit (from the past 720 hour(s)).  Medical History: Past Medical History  Diagnosis Date  . Colitis   . Weight loss   . Diarrhea   . CAD (coronary artery disease)   . Hyperlipidemia   . Hypertension   . Asthma   . GERD (gastroesophageal reflux disease)   . Atrial fibrillation    Assessment: 62 YOF presents to ED following fall, also c/o SOB.  CXR results per radiologist: Mild left basilar opacity, likely a combination of atelectasis and small left pleural effusion. In July she had laparotomy with small bowel resection for recurrent SBO. Orders for pharmacy to dose vancomycin  9/30 >> vancomycin  >> 9/30 >> Cefepime x 1 in ED  Tmax: WBCs: 12.6 Renal: SCr = 0.7 for estimate CrCl = 5ml/min (N), 66ml/min (C-G)  / blood: / urine:  / sputum:   Goal of Therapy:  Vancomycin trough level 15-20 mcg/ml  Plan:   Vancomycin 1gm IV x 1 then 750mg  IV q24h  Check steady-state trough  ?? Continue cefepime  Juliette Alcide, PharmD, BCPS.   Pager: [phone]  09/28/2013,2:40 PM

## 2013-09-28 NOTE — ED Notes (Signed)
Patient transported to X-ray 

## 2013-09-28 NOTE — Progress Notes (Signed)
EDCM consulted to speak to patient regarding possible home health needs.  Patient's daughter Romeo Apple [phone] (cell)  at bedside.  [name] is patient's primary care giver.   Patient is confused.  Patient's daughter reports patient just came home from [facility] last Tuesday.  Patient was at [facility] for seven weeks.  Prior to patient going to [facility], patient had a visiting RN from [facility] who came out to change the dressings on her legs three times per week.  Patient currently has an agency called [employer] who stay with the patient twenty four hours a day. They assist patient with ADL's.  As per patient's daughter, patient does have a walker at home but, "It's not good because it doesn't fit through the door.  The cane doesn't give her enough support, and if she comes home this time, she will need a wheelchair."  Patient does not wear oxygen at home.  Patient's daughter informed [facility] that a physician spoke to her about Palliative Care.  Patient's daughter has many questions about Palliative Care and would like to discuss this option further with her family.  EDCM informed patient's daughter that this type of care was aimed at keeping her mother comfortable and that it would be possible to arrange a meeting with them in the hospital to answer any questions she and her family may have.  Patient's daughter agreeable to have [facility] ask for a Palliative Care consult.  EDCM spoke to Dr. Rito Ehrlich who did not want to place a Palliative consult in at this time.  Dr. Rito Ehrlich would rather the patient's daughter speak to the rounding physician tomorrow to see if a Palliative consult is appropriate  at this time.  Patient's daughter agreeable to this plan.  Patient's daughter reports she may not be able to come to hospital until after 1730pm tomorrow. Patient's daughter agreeable to have physician call her tomorrow during the day on her cell phone number regarding palliative care consult.   EDCM provided patient with list of home health agencies in [facility] and a list of Home Hospice agencies per patient's daughter request.  Patient's daughter thankful for resources and [facility] concern.  EDCM offered support to patient and family.

## 2013-09-28 NOTE — Progress Notes (Signed)
PHARMACY - CEFEPIME (brief note)  Patient known to pharmacy from vancomycin dosing.  MD requested pharmacy to adjust antibiotics based on renal function.  MD has ordered Cefepime 1gm IV q8h x 8 days for pneumonia.  Patient's CrCl ~ 31 ml/min  PLAN:  Change Cefepime to 1gm IV q24h x 8 days for pneumonia.  Thank you Terrilee Files, PharmD

## 2013-09-29 ENCOUNTER — Inpatient Hospital Stay (HOSPITAL_COMMUNITY): Payer: Medicare Other

## 2013-09-29 DIAGNOSIS — E860 Dehydration: Secondary | ICD-10-CM

## 2013-09-29 DIAGNOSIS — I4891 Unspecified atrial fibrillation: Secondary | ICD-10-CM

## 2013-09-29 DIAGNOSIS — D649 Anemia, unspecified: Secondary | ICD-10-CM

## 2013-09-29 LAB — CBC
HCT: 28.7 % — ABNORMAL LOW (ref 36.0–46.0)
MCH: 28 pg (ref 26.0–34.0)
MCHC: 32.8 g/dL (ref 30.0–36.0)
RDW: 19.9 % — ABNORMAL HIGH (ref 11.5–15.5)
WBC: 10.9 10*3/uL — ABNORMAL HIGH (ref 4.0–10.5)

## 2013-09-29 LAB — COMPREHENSIVE METABOLIC PANEL
ALT: 635 U/L — ABNORMAL HIGH (ref 0–35)
AST: 716 U/L — ABNORMAL HIGH (ref 0–37)
Alkaline Phosphatase: 76 U/L (ref 39–117)
CO2: 18 mEq/L — ABNORMAL LOW (ref 19–32)
Chloride: 98 mEq/L (ref 96–112)
GFR calc Af Amer: >90 mL/min (ref >90)
GFR calc non Af Amer: 78 mL/min — ABNORMAL LOW (ref >90)
Glucose, Bld: 72 mg/dL (ref 70–99)
Potassium: 4 mEq/L (ref 3.5–5.1)
Sodium: 128 mEq/L — ABNORMAL LOW (ref 135–145)
Total Protein: 4.9 g/dL — ABNORMAL LOW (ref 6.0–8.3)

## 2013-09-29 NOTE — Progress Notes (Addendum)
INITIAL NUTRITION ASSESSMENT  DOCUMENTATION CODES Per approved criteria  -Severe malnutrition in the context of chronic illness -Underweight   INTERVENTION: Recommend palliative care consult in setting of ongoing poor oral intake and FTT. If aggressive nutrition interventions desired, recommend enteral nutrition. Monitor magnesium, potassium, and phosphorus daily for at least 3 days, MD to replete as needed, as pt is at risk for refeeding syndrome given severe malnutrition. Recommend addition of oral nutrition supplements once diet advanced. Recommend more aggressive bowel regimen, pt has not had a BM x 6 days per chart. RD to continue to follow nutrition care plan.  NUTRITION DIAGNOSIS: Malnutrition related to failure to thrive as evidenced by poor oral intake and severe muscle/fat mass loss.   Goal: Intake to meet >90% of estimated nutrition needs.  Monitor:  weight trends, lab trends, I/O's, PO intake, supplement tolerance  Reason for Assessment: Malnutrition Screening Tool  79 y.o. female  Admitting Dx: HCAP (healthcare-associated pneumonia)  ASSESSMENT: PMHx significant for afib, CAD, severe aortic stenosis. Recent hospitalization from July-August of this year for small bowel obstruction, required TPN. Was discharged to rehab center and recently went home approximately 1 week ago. Admitted with SOB x 3-4 days, sustained 2 falls within the past week and endorses poor appetite with 5 lb wt loss. Work-up reveals HCAP.  Per MD, pt with failure to thrive. Underwent BSE by SLP this morning, pt with suspected esophageal dysphagia; recommendations of Dysphagia 1 diet with thin liquids were made. Pt remains NPO. SLP recommends palliative care consult. RD spoke with patient briefly. She confirms that she has very poor oral intake.  Pt is at risk for refeeding syndrome. Current potassium is WNL. No magnesium or phosphorus is available at this time.  Nutrition Focused Physical  Exam:  Subcutaneous Fat:  Orbital Region: severe depletion Upper Arm Region: severe depletion Thoracic and Lumbar Region: severe depletion  Muscle:  Temple Region: severe depletion Clavicle Bone Region: severe depletion Clavicle and Acromion Bone Region: severe depletion Scapular Bone Region: severe depletion Dorsal Hand: severe depletion Patellar Region: severe depletion Anterior Thigh Region: severe depletion Posterior Calf Region: severe depletion  Edema: n/a  Pt meets criteria for severe MALNUTRITION in the context of chronic illness as evidenced by severe muscle and fat mass loss, intake of <75% x at least 1 month, .    Height: Ht Readings from Last 1 Encounters:  09/28/13 5\' 2"  (1.575 m)    Weight: Wt Readings from Last 1 Encounters:  09/28/13 87 lb 4.8 oz (39.6 kg)    Ideal Body Weight: 110 lb  % Ideal Body Weight: 79%  Wt Readings from Last 10 Encounters:  09/28/13 87 lb 4.8 oz (39.6 kg)  08/12/13 89 lb (40.37 kg)  08/02/13 109 lb 9.1 oz (49.7 kg)  08/02/13 109 lb 9.1 oz (49.7 kg)  10/28/12 91 lb (41.277 kg)  02/10/12 94 lb 6.4 oz (42.82 kg)  10/14/11 87 lb 9.6 oz (39.735 kg)  08/13/11 86 lb (39.009 kg)  07/09/11 84 lb 9.6 oz (38.374 kg)  03/11/11 87 lb 3.2 oz (39.554 kg)    Usual Body Weight: 90-95 lb  % Usual Body Weight: 94%  BMI:  Body mass index is 15.96 kg/(m^2). Underweight  Estimated Nutritional Needs: Kcal: 1200 - 1400 Protein: 40 - 50 g  Fluid: 1.2 - 1.4 liters  Skin:  Stage I pressure ulcer to sacrum Multiple skin tears  Diet Order: NPO  EDUCATION NEEDS: -No education needs identified at this time   Intake/Output Summary (Last  24 hours) at 09/29/13 1054 Last data filed at 09/29/13 0610  Gross per 24 hour  Intake 1193.33 ml  Output    700 ml  Net 493.33 ml    Last BM: 9/25  Labs:   Recent Labs Lab 09/24/13 1515 09/28/13 1315 09/29/13 0441  NA 129* 123* 128*  K 4.0 4.7 4.0  CL 95* 91* 98  CO2  --  19 18*   BUN 20 32* 27*  CREATININE 1.00 0.70 0.64  CALCIUM  --  8.2* 7.8*  GLUCOSE 89 101* 72    CBG (last 3)  No results found for this basename: GLUCAP,  in the last 72 hours  Scheduled Meds: . albuterol  2.5 mg Nebulization Q6H  . amiodarone  200 mg Oral BID  . aspirin  325 mg Oral Daily  . budesonide  6 mg Oral Q lunch  . ceFEPime (MAXIPIME) IV  1 g Intravenous Q24H  . famotidine  20 mg Oral BID  . heparin  5,000 Units Subcutaneous Q8H  . mometasone-formoterol  2 puff Inhalation BID  . sodium chloride  3 mL Intravenous Q12H  . vancomycin  750 mg Intravenous Q24H    Continuous Infusions: . sodium chloride 100 mL/hr at 09/29/13 0530    Past Medical History  Diagnosis Date  . Colitis   . Weight loss   . Diarrhea   . CAD (coronary artery disease)   . Hyperlipidemia   . Hypertension   . Asthma   . GERD (gastroesophageal reflux disease)   . Atrial fibrillation     Past Surgical History  Procedure Laterality Date  . Hernia repair      inguinal  . Coronary angioplasty with stent placement    . Bowel adhesions      several  . Laparotomy N/A 07/26/2013    Procedure: small bowel resection;  Surgeon: Ernestene Mention, MD;  Location: [facility];  Service: General;  Laterality: N/A;    Jarold Motto MS, RD, LDN Pager: [phone] After-hours pager: [phone]

## 2013-09-29 NOTE — Progress Notes (Signed)
[facility] PROGRESS NOTE  Ariel Hunt WUJ:[id number] DOB: 04-16-1923 DOA: 09/28/2013 PCP: Daisy Floro, MD Brief HPI:   This is 79 year old, Caucasian female, who presents with progressively worsening dyspnea. She is very dehydrated, with severe hyponatremia. she has left lower lobe pneumonia. She was started on IV antibiotics and speech and swallow eval requested. Recommended dysphagia 1 diet.   Assessment/Plan: #1 dyspnea with hypoxia and acute respiratory failure with possible healthcare associated pneumonia: Continue oxygen. We will place her on broad-spectrum antibiotics with vancomycin and cefepime. Swallow evaluation will be obtained.  Recommended dysphagia 1 diet.  Nebulizer treatments will be provided. At this time we will hold off on steroids. This can be considered if there is no improvement.   #2 severe dehydration with hyponatremia and failure to thrive: Continue with normal saline infusion. Monitor sodium levels closely. Hyponatremia is most likely due to hypovolemia. Monitor urine output. We will place a Foley catheter.   #3 history of coronary artery disease, Chronic systolic CHF and severe aortic stenosis: These issues appear to be stable. EKG does not show any ischemic changes. EF is 40-45%. Caution with IVF.   #4 history of tachyarrhythmias including V tach, as well as atrial fibrillation: Continue with amiodarone. Monitor her on telemetry. She is not on anticoagulation and just aspirin.   #5 acute encephalopathy: This is most likely due to the metabolic derangements as well as infectious process. Fentanyl patch may have contributed and has been discontinued for now. '  #6 chronic pain due to chronic back issues and arthritis: Resume pain medication as soon as her mental status is back to baseline. We will need to try alternative agents.   #7 history of hypertension: Blood pressure is low normal. We will hold off on all of her antihypertensive medications, except  for the amiodarone. Holding parameters will be utilized. Blood pressure will be monitored closely.   #8 recent small bowel obstruction status post surgical intervention: She does have minimal abdominal discomfort, but abdomen is benign. We will proceed with the 2 view abdominal x-rays which showed mild ileus.   #9 anemia: Hemoglobin is close to baseline.   #10. Chronic lower extremity wounds: Daughter tell me that the wraps were changed just 3 days ago. She tells me that the wounds have healed up. Continue current management. Consult wound care.   Have advised the patient's daughter, that patient seems to be declining.But if there is no improvement I strongly recommended palliative medicine consultation. This can be discussed further in the next day or so depending on patient's clinical condition  Code Status: limited Family Communication: spoke to Daughter over the phone Disposition Plan: pending.    Consultants:  None.   Procedures:  none  Antibiotics:  IV cefepime  IV vancomycin.   HPI/Subjective: i want some thing to drink and eat.   Objective: Filed Vitals:   09/29/13 1300  BP: 105/68  Pulse: 82  Temp: 98 F (36.7 C)  Resp: 20    Intake/Output Summary (Last 24 hours) at 09/29/13 1414 Last data filed at 09/29/13 1405  Gross per 24 hour  Intake 1193.33 ml  Output   1000 ml  Net 193.33 ml   Filed Weights   09/28/13 1716  Weight: 39.6 kg (87 lb 4.8 oz)    Exam:   General:  Alert afebrile comfortable  Cardiovascular: s1s2  Respiratory: RHONCHI BILATERAL  Abdomen: soft NT ND BS+  Musculoskeletal: no pedal edema.   Data Reviewed: Basic Metabolic Panel:  Recent Labs Lab  09/24/13 1515 09/28/13 1315 09/29/13 0441  NA 129* 123* 128*  K 4.0 4.7 4.0  CL 95* 91* 98  CO2  --  19 18*  GLUCOSE 89 101* 72  BUN 20 32* 27*  CREATININE 1.00 0.70 0.64  CALCIUM  --  8.2* 7.8*   Liver Function Tests:  Recent Labs Lab 09/29/13 0441  AST 716*  ALT  635*  ALKPHOS 76  BILITOT 1.6*  PROT 4.9*  ALBUMIN 2.1*   No results found for this basename: LIPASE, AMYLASE,  in the last 168 hours No results found for this basename: AMMONIA,  in the last 168 hours CBC:  Recent Labs Lab 09/24/13 1515 09/28/13 1315 09/29/13 0441  WBC  --  12.6* 10.9*  NEUTROABS  --  11.1*  --   HGB 10.9* 10.1* 9.4*  HCT 32.0* 30.3* 28.7*  MCV  --  83.9 85.4  PLT  --  258 232   Cardiac Enzymes: No results found for this basename: CKTOTAL, CKMB, CKMBINDEX, TROPONINI,  in the last 168 hours BNP (last 3 results)  Recent Labs  07/25/13 0345 07/28/13 0415 09/28/13 1426  PROBNP 7166.0* 1586.0* 27885.0*   CBG: No results found for this basename: GLUCAP,  in the last 168 hours  No results found for this or any previous visit (from the past 240 hour(s)).   Studies: Dg Chest 2 View  09/28/2013   CLINICAL DATA:  Cough, COPD  EXAM: CHEST  2 VIEW  COMPARISON:  07/27/2013  FINDINGS: Chronic position markings carcinoma changes. Mild left basilar opacity, likely a combination of atelectasis and small left pleural effusion. No pneumothorax.  The heart is normal in size.  Discussion thoracolumbar scoliosis.  IMPRESSION: Mild left basilar opacity, likely a combination of atelectasis and small left pleural effusion.   Electronically Signed   By: Charline Bills M.D.   On: 09/28/2013 13:16   Dg Knee Complete 4 Views Left  09/28/2013   CLINICAL DATA:  Fall, left knee abrasion  EXAM: LEFT KNEE - COMPLETE 4+ VIEW  COMPARISON:  None.  FINDINGS: No fracture or dislocation is seen.  Joint spaces are essentially preserved.  No suprapatellar knee joint effusion.  Vascular calcifications.  IMPRESSION: No fracture or dislocation is seen.   Electronically Signed   By: Charline Bills M.D.   On: 09/28/2013 13:18   Dg Abd 2 Views  09/28/2013   CLINICAL DATA:  Abdominal pain.  Abdominal distention.  EXAM: ABDOMEN - 2 VIEW  COMPARISON:  07/26/2013  FINDINGS: There is mild bowel  distention. Loops of mildly dilated small bowel on most evident in the left lower quadrant. Mildly dilated colon is seen in the right lower quadrant and left lower quadrant and pelvis.  A bowel anastomosis staple line is noted in the right pelvis. Calcifications noted along a tortuous and ectatic abdominal aorta and into the iliac arteries. The soft tissues are otherwise unremarkable.  There is no free air on the decubitus view. There are a few air-fluid levels in the mildly dilated bowel.  Extensive degenerative changes and scoliosis are noted throughout the visualized spine, stable. The bones are extensively demineralized.  IMPRESSION: Mild dilation of small bowel as well as colon. There are few air-fluid levels. This suggests a mild adynamic ileus. No convincing obstruction. No free air.   Electronically Signed   By: Amie Portland   On: 09/28/2013 19:13    Scheduled Meds: . albuterol  2.5 mg Nebulization Q6H  . amiodarone  200 mg Oral BID  .  aspirin  325 mg Oral Daily  . budesonide  6 mg Oral Q lunch  . ceFEPime (MAXIPIME) IV  1 g Intravenous Q24H  . famotidine  20 mg Oral BID  . heparin  5,000 Units Subcutaneous Q8H  . mometasone-formoterol  2 puff Inhalation BID  . sodium chloride  3 mL Intravenous Q12H  . vancomycin  750 mg Intravenous Q24H   Continuous Infusions: . sodium chloride 100 mL/hr at 09/29/13 0530    Principal Problem:   HCAP (healthcare-associated pneumonia) Active Problems:   CAD (coronary artery disease)   Anemia   Aortic stenosis, severe   Ventricular tachycardia   Atrial fibrillation   Hyponatremia   FTT (failure to thrive) in adult   Dehydration   Acute encephalopathy    Time spent: 25 minutes.     [facility]  [facility] Pager [phone] If 7PM-7AM, please contact night-coverage at [email or web address], password [facility] 09/29/2013, 2:14 PM  LOS: 1 day

## 2013-09-29 NOTE — Evaluation (Signed)
Clinical/Bedside Swallow Evaluation Patient Details  Name: Ariel Hunt MRN: [id number] Date of Birth: Apr 30, 1923  Today's Date: 09/29/2013 Time: 7829-5621 SLP Time Calculation (min): 23 min  Past Medical History:  Past Medical History  Diagnosis Date  . Colitis   . Weight loss   . Diarrhea   . CAD (coronary artery disease)   . Hyperlipidemia   . Hypertension   . Asthma   . GERD (gastroesophageal reflux disease)   . Atrial fibrillation    Past Surgical History:  Past Surgical History  Procedure Laterality Date  . Hernia repair      inguinal  . Coronary angioplasty with stent placement    . Bowel adhesions      several  . Laparotomy N/A 07/26/2013    Procedure: small bowel resection;  Surgeon: Ernestene Mention, MD;  Location: [facility];  Service: General;  Laterality: N/A;   HPI:   Ariel Hunt is a 79 y.o. female with a past medical history of atrial fibrillation, coronary artery disease, severe aortic stenosis, who was hospitalized back in July and stayed up until early August for small bowel obstruction. She underwent surgery and lysis of adhesions. She was subsequently sent to rehabilitation. She is accompanied by her daughter today. She came back home from rehabilitation Center about one week ago. She's had 2 falls during this time. She's been in the ED, twice in the last 5 days. She fell on her knees a few days ago. She presented to the ED, underwent x-rays. No fractures were noted, and she was sent back home. According to the daughter she has developed worsening shortness of breath over the last 3-4 days. She's coughing up whitish expectoration. Denies any fever or chills. No nausea, or vomiting. She's lost about 5 pounds in the last one week. Patient has had poor appetite and has been getting progressively weaker as well.And according to the daughter that has been contributing to her confusion and she has been hallucinating at home. When she was brought into the ED, she was found  to be mildly hypoxic between 86-89%. With, oxygen she's come up to mid 90s. Patient is very confused and unable to provide any history.   Assessment / Plan / Recommendation Clinical Impression  Cachectic elderly female, with c/o sore throat with swallowing, and suspected esophageal dysphagia due to significant belching after only 3 sips of water and 2 bites of applesauce.  Pt. denies h/o hiatal hernia or GERD, but reports she has no appetite.  Pt. appears to swallow without delay and without s/s of aspiration, however, her cough is severely congested and occured prior to po's given.  ? source of odynophagia (? possible esophagitis).  Pt. also has pain when sitting upright.  Left lower lobe opacity could be due to aspiration when eating/drinking in a reclined position.  Doubt that pt. would tolerate a MBS due to difficulty sitting upright.  Recommend Palliative Care Consult re: GOC and code status.  Recommend Dysphagia 1 with thin liquids, if pt./family/MD accept risks associated with aspiration.    Aspiration Risk  Moderate    Diet Recommendation Dysphagia 1 (Puree);Thin liquid   Liquid Administration via: Cup;No straw Medication Administration: Whole meds with puree Supervision: Staff feed patient Compensations: Slow rate;Small sips/bites;Follow solids with liquid Postural Changes and/or Swallow Maneuvers: Seated upright 90 degrees    Other  Recommendations Recommended Consults: Consider esophageal assessment Oral Care Recommendations: Oral care QID   Follow Up Recommendations  Skilled Nursing facility  Frequency and Duration min 2x/week  2 weeks   Pertinent Vitals/Pain n/a    SLP Swallow Goals Patient will consume recommended diet without observed clinical signs of aspiration with: Moderate assistance Patient will utilize recommended strategies during swallow to increase swallowing safety with: Moderate assistance   Swallow Study Prior Functional Status       General HPI:   Ariel Hunt is a 79 y.o. female with a past medical history of atrial fibrillation, coronary artery disease, severe aortic stenosis, who was hospitalized back in July and stayed up until early August for small bowel obstruction. She underwent surgery and lysis of adhesions. She was subsequently sent to rehabilitation. She is accompanied by her daughter today. She came back home from rehabilitation Center about one week ago. She's had 2 falls during this time. She's been in the ED, twice in the last 5 days. She fell on her knees a few days ago. She presented to the ED, underwent x-rays. No fractures were noted, and she was sent back home. According to the daughter she has developed worsening shortness of breath over the last 3-4 days. She's coughing up whitish expectoration. Denies any fever or chills. No nausea, or vomiting. She's lost about 5 pounds in the last one week. Patient has had poor appetite and has been getting progressively weaker as well.And according to the daughter that has been contributing to her confusion and she has been hallucinating at home. When she was brought into the ED, she was found to be mildly hypoxic between 86-89%. With, oxygen she's come up to mid 90s. Patient is very confused and unable to provide any history. Type of Study: Bedside swallow evaluation Previous Swallow Assessment: None in [facility] system Diet Prior to this Study: NPO Temperature Spikes Noted: No Respiratory Status: Supplemental O2 delivered via (comment) History of Recent Intubation: No Behavior/Cognition: Alert;Cooperative;Pleasant mood;Confused Oral Cavity - Dentition: Adequate natural dentition Self-Feeding Abilities: Needs assist Patient Positioning: Upright in bed Baseline Vocal Quality: Clear Volitional Cough: Congested Volitional Swallow: Unable to elicit    Oral/Motor/Sensory Function Overall Oral Motor/Sensory Function: Appears within functional limits for tasks assessed   Ice Chips Ice chips:  Within functional limits Presentation: Spoon   Thin Liquid Thin Liquid: Within functional limits Presentation: Spoon;Cup    Nectar Thick Nectar Thick Liquid: Not tested   Honey Thick Honey Thick Liquid: Not tested   Puree Puree: Within functional limits Presentation: Spoon   Solid   GO    Solid: Not tested (Refused due to sore throat)       Anne Hahn, Travanti Mcmanus T 09/29/2013,10:12 AM

## 2013-09-30 ENCOUNTER — Inpatient Hospital Stay (HOSPITAL_COMMUNITY): Payer: Medicare Other

## 2013-09-30 DIAGNOSIS — R627 Adult failure to thrive: Secondary | ICD-10-CM

## 2013-09-30 LAB — COMPREHENSIVE METABOLIC PANEL
Albumin: 2.1 g/dL — ABNORMAL LOW (ref 3.5–5.2)
BUN: 23 mg/dL (ref 6–23)
Calcium: 7.9 mg/dL — ABNORMAL LOW (ref 8.4–10.5)
Chloride: 99 mEq/L (ref 96–112)
Creatinine, Ser: 0.53 mg/dL (ref 0.50–1.10)
Glucose, Bld: 124 mg/dL — ABNORMAL HIGH (ref 70–99)
Total Bilirubin: 1.4 mg/dL — ABNORMAL HIGH (ref 0.3–1.2)
Total Protein: 5.2 g/dL — ABNORMAL LOW (ref 6.0–8.3)

## 2013-09-30 LAB — PRO B NATRIURETIC PEPTIDE: Pro B Natriuretic peptide (BNP): 7860 pg/mL — ABNORMAL HIGH (ref 0–450)

## 2013-09-30 MED ORDER — FUROSEMIDE 20 MG PO TABS
20.0000 mg | ORAL_TABLET | Freq: Every day | ORAL | Status: DC
  Administered 2013-10-01 – 2013-10-06 (×6): 20 mg via ORAL
  Filled 2013-09-30 (×6): qty 1

## 2013-09-30 MED ORDER — IRBESARTAN 75 MG PO TABS
75.0000 mg | ORAL_TABLET | Freq: Every day | ORAL | Status: DC
  Administered 2013-09-30 – 2013-10-06 (×7): 75 mg via ORAL
  Filled 2013-09-30 (×7): qty 1

## 2013-09-30 NOTE — Progress Notes (Signed)
CSW faxed information out to [facility] as a backup to [employer] - per [facility] [place], they have no availability currently but CSW will check back in the morning.   Clinical Social Work Department CLINICAL SOCIAL WORK PLACEMENT NOTE 09/30/2013  Patient:  SEQUOYA, HOGSETT  Account Number:  [id number] Admit date:  09/28/2013  Clinical Social Worker:  Orpah Greek  Date/time:  09/30/2013 03:34 PM  Clinical Social Work is seeking post-discharge placement for this patient at the following level of care:   SKILLED NURSING   (*CSW will update this form in Epic as items are completed)   09/30/2013  Patient/family provided with [facility] Department of Clinical Social Work's list of facilities offering this level of care within the geographic area requested by the patient (or if unable, by the patient's family).  09/30/2013  Patient/family informed of their freedom to choose among providers that offer the needed level of care, that participate in Medicare, Medicaid or managed care program needed by the patient, have an available bed and are willing to accept the patient.  09/30/2013  Patient/family informed of [facility]' ownership interest in [facility], as well as of the fact that they are under no obligation to receive care at this facility.  PASARR submitted to EDS on 09/30/2013 PASARR number received from EDS on 09/30/2013  FL2 transmitted to all facilities in geographic area requested by pt/family on  09/30/2013 FL2 transmitted to all facilities within larger geographic area on   Patient informed that his/her managed care company has contracts with or will negotiate with  certain facilities, including the following:     Patient/family informed of bed offers received:   Patient chooses bed at  Physician recommends and patient chooses bed at    Patient to be transferred to  on   Patient to be transferred to facility by    The following physician request were entered in Epic:   Additional Comments:   Unice Bailey, LCSW [facility] Clinical Social Worker cell #: [phone]

## 2013-09-30 NOTE — Progress Notes (Signed)
Speech Language Pathology Dysphagia Treatment Patient Details Name: Ariel Hunt MRN: [id number] DOB: 1923/02/13 Today's Date: 09/30/2013 Time: 1000-1018 SLP Time Calculation (min): 18 min  Assessment / Plan / Recommendation Clinical Impression  Reduced belching noted after po trials today. Cough continues to be significantly congested and largely nonproductive.  Pt has poor intake due to poor appetite.  No overt s/s aspiration observed or reported.  RN reports vital signs stable.  Safe swallow and reflux precautions posted at [facility].  GOC meeting later this afternoon.  ST to follow up after GOC established for any additional family education that is needed.      Diet Recommendation  Continue with Current Diet: Dysphagia 1 (puree);Thin liquid    SLP Plan Continue with current plan of care   Pertinent Vitals/Pain Right hip pain, 4 on PAINAD scale. RN notified.   Swallowing Goals  SLP Swallowing Goals Patient will consume recommended diet without observed clinical signs of aspiration with: Moderate assistance Swallow Study Goal #1 - Progress: Progressing toward goal Patient will utilize recommended strategies during swallow to increase swallowing safety with: Moderate assistance Swallow Study Goal #2 - Progress: Progressing toward goal  General Temperature Spikes Noted: No Respiratory Status: Room air Behavior/Cognition: Alert;Cooperative;Pleasant mood;Confused Patient Positioning: Upright in bed  Oral Cavity - Oral Hygiene Does patient have any of the following "at risk" factors?: Nutritional status - inadequate Brush patient's teeth BID with toothbrush (using toothpaste with fluoride): Yes Patient is AT RISK - Oral Care Protocol followed (see row info): Yes   Dysphagia Treatment Treatment focused on: Skilled observation of diet tolerance;Patient/family/caregiver education Family/Caregiver Educated: Pt educated, precautions posted Treatment Methods/Modalities: Skilled  observation Patient observed directly with PO's: Yes Type of PO's observed: Dysphagia 1 (puree);Thin liquids Feeding: Total assist Liquids provided via: Straw Type of cueing: Verbal Amount of cueing: Minimal   GO   Celia B. [place], [facility], CCC-SLP [phone]   Leigh Aurora 09/30/2013, 10:23 AM

## 2013-09-30 NOTE — Consult Note (Signed)
Patient ZO:XWRUEA AALYAH MANSOURI      DOB: 1923/12/01      VWU:[id number]   Summary of Goals of Care; full note to follow:  Met with Daughter [name] and son Loistine Chance  Reviewed the current medical conditions that impair Samie's health and well being namely, her underlying cardiac condition,  Failure to thrive/frailty, recurrent SBO,  Recurrent hyponatremia, chronic pain, and now more intermittent confusion.  Family realizes that they are not at the same place they were a few months ago when Janeka was able to sustain a cardiac complication during surgical procedure and recoup enough to tolerate rehab and return home.  She had been home less  Than a full week when she fell at home and was admitted with pneumonia/CHF.  Course has also been complicated by pain, elevated liver enzymes  and delirium.  Family is coming to terms with the likelihood that Latangela may not recover this time. They would like to see how she does in the next twenty four to forty eight hours .  They are considering residential placement if she does not show signs of meaningful improvement.    For tonight will order air mattress overlay to be placed in the am as family did not want to move her again after she had to be moved for radiology.  Fentanyl patch has been removed.  It was likely causing some delerium and decreased appetite.  Agree with Vicodin as ordered for now.  If she needs higher level of pain control would consider using Roxanol or liquid oxyir.    Recommend:  1.  DNR noted with partial code status to allow for non-ACLS control of her arrhthymias namely afib  2.  Pain in her back:  Air mattress.  Vicodin .  Reassess in am can consider adjunctive lidocain patch.  Do not resume Fentanyl patch.  3.  Failure to thrive/protein calorie malnutrition:  Continue to treat pain which is make her feel like not eating.  Supplements and liberalize diet if needed.  4. Transaminitis of unknown cause: could this be passive congestion,  dehydration, resolving shock liver (arrest in July seems too long). Monitor. Try to spare the liver too much insult. Doubt tylenol overdose.  If numbers not trending down tomorrow further will have to consider changing to non-tylenol product. Can consider resuming lidocaine patches if liver enzymes coming down further.  5. Intravascular volume depletion with elevated ProBNP: difficult situation.  Encourage oral intake.  Dailyn Reith L. Ladona Ridgel, MD MBA The Palliative Medicine Team at [facility] Phone: [phone] Pager: [phone]  530 pm-700 pm

## 2013-09-30 NOTE — Consult Note (Signed)
Patient ZO:[id number] H Corney      DOB: 1923/07/08      VWU:[id number]     Consult Note from the Palliative Medicine Team at [facility]    Consult Requested by:  Dr. Blake Divine     PCP: Daisy Floro, MD Reason for Consultation: GOC     Phone Number:[phone] Related shortness of breath Assessment of patients Current state: 79 yr old white female who is very frail related to her recent SBO with resection and reanastamosis.  Her post op course was complicated by MSSA infection related to a PICC and continued failure to thrive.  She was admitted with aspiration pneumonia and cachexia.  Albumin 2.1  Current weight, 45.2 kg.  Course also complicated by severe AS, and hepatitis.  Family discussed continued curative treatment with watchful waiting to see which directions she was heading.  See my summary notes for additional comments.  Will need to follow along to make decisions as we see how she reponds. Over all,  Her family realizes how fragile she is and wants relief from suffering to take the front stage.  The patient has another son who is coming in form out of state to help with decision making.  They would like her to participate if she can.   Goals of Care: 1.  Code Status: No CPR, or intubation .  They would like to consider use of vasopressors or antiarrhythmics.   2. Scope of Treatment:  Continue current antibiotics and treatments for cure.  Family states the fentanyl made her confused but she tolerates hydrocodone.  Will attempt to utilize this if it is helpful   4. Disposition: To be determined   3. Symptom Management:   1. Anxiety/Agitation: control pain not needed otherwise 2. Pain: hydrocodone prn 3. Bowel Regimen: monitor may have ileus vs obstruction per xrays 4. Delirium: medication related fentanyl stopped,  Monitor on hydrocodone 5. Fever: Tylenol   4. Psychosocial:  Three children. Very independent  5. Spiritual: Chaplain support offered     Patient Documents  Completed or Given: Document Given Completed  Advanced Directives Pkt    MOST    DNR    Gone from My Sight    Hard Choices      Brief HPI: 79 yr old white female who was recovery from recent SBO with surgical intervention.  Patient returns from rehab with shortness found to have pneumonia likely aspiration.  Patient also complaining of increased back pain.  We were asked to assist with goals of care.   ROS:  Back pain, decreased appetite.    PMH:  Past Medical History  Diagnosis Date  . Colitis   . Weight loss   . Diarrhea   . CAD (coronary artery disease)   . Hyperlipidemia   . Hypertension   . Asthma   . GERD (gastroesophageal reflux disease)   . Atrial fibrillation      PSH: Past Surgical History  Procedure Laterality Date  . Hernia repair      inguinal  . Coronary angioplasty with stent placement    . Bowel adhesions      several  . Laparotomy N/A 07/26/2013    Procedure: small bowel resection;  Surgeon: Ernestene Mention, MD;  Location: [facility];  Service: General;  Laterality: N/A;   I have reviewed the FH and SH and  If appropriate update it with new information. No Known Allergies Scheduled Meds: . albuterol  2.5 mg Nebulization Q6H  . amiodarone  200 mg  Oral BID  . aspirin  325 mg Oral Daily  . budesonide  6 mg Oral Q lunch  . ceFEPime (MAXIPIME) IV  1 g Intravenous Q24H  . famotidine  20 mg Oral BID  . [START ON 10/01/2013] furosemide  20 mg Oral Daily  . heparin  5,000 Units Subcutaneous Q8H  . irbesartan  75 mg Oral Daily  . mometasone-formoterol  2 puff Inhalation BID  . sodium chloride  3 mL Intravenous Q12H  . vancomycin  750 mg Intravenous Q24H   Continuous Infusions:  PRN Meds:.acetaminophen, acetaminophen, albuterol, HYDROcodone-acetaminophen, ondansetron (ZOFRAN) IV, ondansetron    BP 140/61  Pulse 76  Temp(Src) 97.7 F (36.5 C) (Oral)  Resp 18  Ht 5\' 2"  (1.575 m)  Wt 39.6 kg (87 lb 4.8 oz)  BMI 15.96 kg/m2  SpO2 100%   PPS:  23-30%   Intake/Output Summary (Last 24 hours) at 09/30/13 1915 Last data filed at 09/30/13 1819  Gross per 24 hour  Intake   2350 ml  Output    950 ml  Net   1400 ml    Physical Exam:  General: cachectic, pleasantly confused white female, slight dyspnea HEENT:  PERRL, small pupils mm dry Chest:   Coarse rhonchi, no wheezing CVS: regular S1, S2, no  Abdomen:scaphoid, but the firm, not distended Ext: cachectic,  Neuro:awake and pleasantly confused  Labs: CBC    Component Value Date/Time   WBC 10.9* 09/29/2013 0441   RBC 3.36* 09/29/2013 0441   RBC 3.60* 07/18/2013 1038   HGB 9.4* 09/29/2013 0441   HCT 28.7* 09/29/2013 0441   PLT 232 09/29/2013 0441   MCV 85.4 09/29/2013 0441   MCH 28.0 09/29/2013 0441   MCHC 32.8 09/29/2013 0441   RDW 19.9* 09/29/2013 0441   LYMPHSABS 0.5* 09/28/2013 1315   MONOABS 1.0 09/28/2013 1315   EOSABS 0.0 09/28/2013 1315   BASOSABS 0.0 09/28/2013 1315     CMP     Component Value Date/Time   NA 128* 09/30/2013 1342   K 3.7 09/30/2013 1342   CL 99 09/30/2013 1342   CO2 18* 09/30/2013 1342   GLUCOSE 124* 09/30/2013 1342   BUN 23 09/30/2013 1342   CREATININE 0.53 09/30/2013 1342   CALCIUM 7.9* 09/30/2013 1342   PROT 5.2* 09/30/2013 1342   ALBUMIN 2.1* 09/30/2013 1342   AST 264* 09/30/2013 1342   ALT 569* 09/30/2013 1342   ALKPHOS 96 09/30/2013 1342   BILITOT 1.4* 09/30/2013 1342   GFRNONAA 83* 09/30/2013 1342   GFRAA >90 09/30/2013 1342    Chest Xray Reviewed/Impressions:stable left basilar infiltrate     Time In Time Out Total Time Spent with Patient Total Overall Time  530 pm 7 pm 30 pm 90 pm    Greater than 50%  of this time was spent counseling and coordinating care related to the above assessment and plan.    Valicia Rief L. Ladona Ridgel, MD MBA The Palliative Medicine Team at [facility] Phone: [phone] Pager: [phone]

## 2013-09-30 NOTE — Progress Notes (Addendum)
[facility] PROGRESS NOTE  Ariel Hunt ZOX:[id number] DOB: 10-25-1923 DOA: 09/28/2013 PCP: Daisy Floro, MD Brief HPI:   This is 79 year old, Caucasian female, who presents with progressively worsening dyspnea. She is very dehydrated, with severe hyponatremia. she has left lower lobe pneumonia. She was started on IV antibiotics and speech and swallow eval requested. Recommended dysphagia 1 diet.   Assessment/Plan: #1 dyspnea with hypoxia and acute respiratory failure with possible healthcare associated pneumonia: Continue oxygen. We will place her on broad-spectrum antibiotics with vancomycin and cefepime. Swallow evaluation will be obtained.  Recommended dysphagia 1 diet.  Nebulizer treatments will be provided. At this time we will hold off on steroids. This can be considered if there is no improvement.   #2 severe dehydration with hyponatremia and failure to thrive: improving. Monitor sodium levels closely. Hyponatremia is most likely due to hypovolemia. Monitor urine output.   #3 history of coronary artery disease, Chronic systolic CHF and severe aortic stenosis: These issues appear to be stable. EKG does not show any ischemic changes. EF is 40-45%. Caution with IVF.   #4 history of tachyarrhythmias including V tach, as well as atrial fibrillation: Continue with amiodarone. Monitor her on telemetry. She is not on anticoagulation and just aspirin.   #5 acute encephalopathy: This is most likely due to the metabolic derangements as well as infectious process. Fentanyl patch may have contributed and has been discontinued for now. '  #6 chronic pain due to chronic back issues and arthritis: Resume pain medication as soon as her mental status is back to baseline. We will need to try alternative agents.   #7 history of hypertension: Blood pressure is low normal. We will hold off on all of her antihypertensive medications, except for the amiodarone. Holding parameters will be utilized.  Blood pressure will be monitored closely.   #8 recent small bowel obstruction status post surgical intervention: She does have minimal abdominal discomfort, but abdomen is benign. We will proceed with the 2 view abdominal x-rays which showed mild ileus.   #9 anemia: Hemoglobin is close to baseline.   #10. Chronic lower extremity wounds: Daughter tell me that the wraps were changed just 3 days ago. She tells me that the wounds have healed up. Continue current management. Consult wound care.   Have advised the patient's daughter, that patient seems to be declining. Requested PT eval and palliative care consult.   Elevated liver function tests: unclear etiology, [place] abd does not show any liver lesions, repeat levels pending.   Code Status: limited Family Communication: spoke to Daughter over the phone Disposition Plan: pending.    Consultants:  Palliative care   Procedures:  none  Antibiotics:  IV cefepime  IV vancomycin.   HPI/Subjective: Is more comfortable today.   Objective: Filed Vitals:   09/30/13 0748  BP:   Pulse:   Temp:   Resp: 16    Intake/Output Summary (Last 24 hours) at 09/30/13 1330 Last data filed at 09/30/13 0900  Gross per 24 hour  Intake   2290 ml  Output   1000 ml  Net   1290 ml   Filed Weights   09/28/13 1716  Weight: 39.6 kg (87 lb 4.8 oz)    Exam:   General:  Alert afebrile comfortable  Cardiovascular: s1s2  Respiratory: RHONCHI BILATERAL  Abdomen: soft NT ND BS+  Musculoskeletal: no pedal edema.   Data Reviewed: Basic Metabolic Panel:  Recent Labs Lab 09/24/13 1515 09/28/13 1315 09/29/13 0441  NA 129* 123* 128*  K 4.0 4.7 4.0  CL 95* 91* 98  CO2  --  19 18*  GLUCOSE 89 101* 72  BUN 20 32* 27*  CREATININE 1.00 0.70 0.64  CALCIUM  --  8.2* 7.8*   Liver Function Tests:  Recent Labs Lab 09/29/13 0441  AST 716*  ALT 635*  ALKPHOS 76  BILITOT 1.6*  PROT 4.9*  ALBUMIN 2.1*   No results found for this  basename: LIPASE, AMYLASE,  in the last 168 hours No results found for this basename: AMMONIA,  in the last 168 hours CBC:  Recent Labs Lab 09/24/13 1515 09/28/13 1315 09/29/13 0441  WBC  --  12.6* 10.9*  NEUTROABS  --  11.1*  --   HGB 10.9* 10.1* 9.4*  HCT 32.0* 30.3* 28.7*  MCV  --  83.9 85.4  PLT  --  258 232   Cardiac Enzymes: No results found for this basename: CKTOTAL, CKMB, CKMBINDEX, TROPONINI,  in the last 168 hours BNP (last 3 results)  Recent Labs  07/25/13 0345 07/28/13 0415 09/28/13 1426  PROBNP 7166.0* 1586.0* 27885.0*   CBG: No results found for this basename: GLUCAP,  in the last 168 hours  Recent Results (from the past 240 hour(s))  CULTURE, BLOOD (ROUTINE X 2)     Status: None   Collection Time    09/28/13  5:31 PM      Result Value Range Status   Specimen Description BLOOD RIGHT HAND   Final   Special Requests BOTTLES DRAWN AEROBIC AND ANAEROBIC 10CC   Final   Culture  Setup Time     Final   Value: 09/29/2013 01:57     Performed at [facility]   Culture     Final   Value:        BLOOD CULTURE RECEIVED NO GROWTH TO DATE CULTURE WILL BE HELD FOR 5 DAYS BEFORE ISSUING A FINAL NEGATIVE REPORT     Performed at [facility]   Report Status PENDING   Incomplete  CULTURE, BLOOD (ROUTINE X 2)     Status: None   Collection Time    09/28/13  5:32 PM      Result Value Range Status   Specimen Description BLOOD RIGHT ARM   Final   Special Requests BOTTLES DRAWN AEROBIC AND ANAEROBIC 10CC   Final   Culture  Setup Time     Final   Value: 09/29/2013 02:32     Performed at [facility]   Culture     Final   Value:        BLOOD CULTURE RECEIVED NO GROWTH TO DATE CULTURE WILL BE HELD FOR 5 DAYS BEFORE ISSUING A FINAL NEGATIVE REPORT     Performed at [facility]   Report Status PENDING   Incomplete     Studies: US Abdomen Complete  09/29/2013   *RADIOLOGY REPORT*  Clinical Data:  Elevated LFTs, initial encounter.  COMPLETE  ABDOMINAL ULTRASOUND  Comparison:  CT abdomen pelvis - 07/07/2013  Findings:  Gallbladder:  There is layering echogenic debris compatible with sludge within an otherwise normal-appearing gallbladder.  No gallbladder wall thickening or pericholecystic fluid.  Negative sonographic Murphy's sign.  Common bile duct:  Normal in size measuring 5.4 mm in diameter  Liver:  There is mild diffuse slightly coarsened echogenicity of the hepatic parenchyma.  No discrete hepatic lesions.  No definite intrahepatic biliary ductal dilatation.  There is a trace amount of ascites adjacent to the liver edge.  IVC:  Appears normal.  Pancreas:  Limited visualization of the pancreatic head and neck is normal.  Visualization of the pancreatic body and tail is obscured by bowel gas.  Spleen:  Normal in size measuring 5.1 cm in length.  There are multiple punctate echogenic foci within the spleen compatible with granulomas.  There is a trace amount of perisplenic fluid.  Right Kidney:  Normal cortical thickness, echogenicity and size, measuring 8.7 cm in length.  No focal renal lesions.  No echogenic renal stones.  Note is made of a extrarenal pelvis, similar to prior abdominal CT. No urinary obstruction.  Left Kidney:  Normal cortical thickness, echogenicity and size, measuring 10.0 cm in length.  No focal renal lesions.  No echogenic renal stones.  No urinary obstruction.  Abdominal aorta:  No aneurysm identified.  Incidental note is made of small bilateral pleural effusions.  IMPRESSION:  1.  Nonspecific mild slightly coarsened echogenicity of the hepatic parenchyma.  No discrete hepatic lesions. 2.  Layering echogenic sludge with an otherwise normal-appearing gallbladder.  3.  Small bilateral effusions with trace amount of perihepatic and perisplenic fluid, nonspecific but could be the sequela of volume overload/congestive heart failure.  Clinical correlation is advised.   Original Report Authenticated By: Tacey Ruiz, MD   Dg Abd 2  Views  09/29/2013   CLINICAL DATA:  Abdominal pain  EXAM: ABDOMEN - 2 VIEW  COMPARISON:  09/28/2013  FINDINGS: Distended large and small bowel loops unchanged from the prior study and most consistent with adynamic ileus. No free air.  IMPRESSION: Moderate ileus is unchanged from the prior study.   Electronically Signed   By: Marlan Palau M.D.   On: 09/29/2013 17:14   Dg Abd 2 Views  09/28/2013   CLINICAL DATA:  Abdominal pain.  Abdominal distention.  EXAM: ABDOMEN - 2 VIEW  COMPARISON:  07/26/2013  FINDINGS: There is mild bowel distention. Loops of mildly dilated small bowel on most evident in the left lower quadrant. Mildly dilated colon is seen in the right lower quadrant and left lower quadrant and pelvis.  A bowel anastomosis staple line is noted in the right pelvis. Calcifications noted along a tortuous and ectatic abdominal aorta and into the iliac arteries. The soft tissues are otherwise unremarkable.  There is no free air on the decubitus view. There are a few air-fluid levels in the mildly dilated bowel.  Extensive degenerative changes and scoliosis are noted throughout the visualized spine, stable. The bones are extensively demineralized.  IMPRESSION: Mild dilation of small bowel as well as colon. There are few air-fluid levels. This suggests a mild adynamic ileus. No convincing obstruction. No free air.   Electronically Signed   By: Amie Portland   On: 09/28/2013 19:13    Scheduled Meds: . albuterol  2.5 mg Nebulization Q6H  . amiodarone  200 mg Oral BID  . aspirin  325 mg Oral Daily  . budesonide  6 mg Oral Q lunch  . ceFEPime (MAXIPIME) IV  1 g Intravenous Q24H  . famotidine  20 mg Oral BID  . heparin  5,000 Units Subcutaneous Q8H  . mometasone-formoterol  2 puff Inhalation BID  . sodium chloride  3 mL Intravenous Q12H  . vancomycin  750 mg Intravenous Q24H   Continuous Infusions: . sodium chloride 100 mL/hr at 09/30/13 0411    Principal Problem:   HCAP (healthcare-associated  pneumonia) Active Problems:   CAD (coronary artery disease)   Anemia   Aortic stenosis, severe  Ventricular tachycardia   Atrial fibrillation   Hyponatremia   FTT (failure to thrive) in adult   Dehydration   Acute encephalopathy    Time spent: 25 minutes.     [facility]  [facility] Pager [phone] If 7PM-7AM, please contact night-coverage at [email or web address], password [facility] 09/30/2013, 1:30 PM  LOS: 2 days

## 2013-09-30 NOTE — Progress Notes (Signed)
Clinical Social Work Department BRIEF PSYCHOSOCIAL ASSESSMENT 09/30/2013  Patient:  Ariel, Hunt     Account Number:  [id number]     Admit date:  09/28/2013  Clinical Social Worker:  Ariel Hunt  Date/Time:  09/30/2013 03:25 PM  Referred by:  Physician  Date Referred:  09/30/2013 Referred for  [facility] placement  SNF Placement   Other Referral:   Interview type:  Family Other interview type:   patient's son, Ariel Hunt & daughter, Ariel Hunt both via phone    PSYCHOSOCIAL DATA Living Status:  ALONE Admitted from [facility]:   Level of care:   Primary support name:  Ariel Hunt (son) ph#: [phone] Primary support relationship to patient:  CHILD, ADULT Degree of support available:   good    CURRENT CONCERNS Current Concerns  Post-Acute Placement   Other Concerns:    SOCIAL WORK ASSESSMENT / PLAN CSW received consult from Dr. Blake Hunt that patient will most likely need SNF vs. [facility] @ discharge.   Assessment/plan status:  Information/Referral to Ariel Hunt Other assessment/ plan:   Per ED RNCM, [facility]el [facility]'s note - patient had recently discharged home from [facility] 9/23 after 7 weeks there. Prior to patient going to [facility], patient had a visiting RN from [facility] who came out to change the dressings on her legs three times per week.  Patient currently has an agency called [facility] who stay with the patient twenty four hours a day. They assist patient with ADL's.   Information/referral to community resources:   CSW spoke with patient's son & daughter via phone re: [facility] facilities - daughter requesting [facility]. CSW called & spoke with [facility] who states that currently, they have no availability but CSW will check back in the morning. CSW faxed clinical information to them, per Ariel Hunt's request.    PATIENT'S/FAMILY'S RESPONSE TO PLAN OF CARE: Patient's children are open to  [facility] - if MD feels that patient would meet their requirments. Note Palliative Medicine Team has Goals of Care meeting scheduled for 5:30p today. CSW will follow-up in the morning.       Ariel Bailey, LCSW [facility] Clinical Social Worker cell #: [phone]

## 2013-09-30 NOTE — Progress Notes (Signed)
Thank you for consulting the Palliative Medicine Team at [facility] to meet your patient's and family's needs.   The reason that you asked [place] to see your patient is for goals of care discussion  We have scheduled your patient for a meeting: today Thursday 10/2 @ 5:30 pm per daughter request  The Surrogate decision maker is: daughter Romeo Apple c: [phone] and son Mishika Flippen [phone]  Other family members that need to be present: son Kameshia Madruga   Your patient is able/unable to participate: TBD per chart notes and discussion with patient's daughter patient has had intermittent periods of confusion  Additional Narrative:  Clinical research associate currently at [facility]- spoke via phone with patient's staff RN, Jeral Pinch, who informed patient was  comfortable at this time- spoke with daughter, [name] via phone [phone]) she indicated she visited her mother early this morning and is now at work, she felt her mother on her visit was more awake and comfortable than yesterday;  requests meeting time for later today as both she and her brother will be coming from work - meeting time scheduled for today Thursday 10/2 @ 5:30 pm    Valente David, RN 09/30/2013, 9:28 AM Palliative Medicine Team RN Liaison [phone]

## 2013-10-01 ENCOUNTER — Inpatient Hospital Stay (HOSPITAL_COMMUNITY): Payer: Medicare Other

## 2013-10-01 DIAGNOSIS — I5021 Acute systolic (congestive) heart failure: Secondary | ICD-10-CM

## 2013-10-01 DIAGNOSIS — Z515 Encounter for palliative care: Secondary | ICD-10-CM

## 2013-10-01 DIAGNOSIS — I509 Heart failure, unspecified: Secondary | ICD-10-CM

## 2013-10-01 MED ORDER — MUPIROCIN 2 % EX OINT
1.0000 "application " | TOPICAL_OINTMENT | Freq: Two times a day (BID) | CUTANEOUS | Status: DC
  Administered 2013-10-01: 1 via NASAL

## 2013-10-01 MED ORDER — ALBUTEROL SULFATE (5 MG/ML) 0.5% IN NEBU
2.5000 mg | INHALATION_SOLUTION | Freq: Three times a day (TID) | RESPIRATORY_TRACT | Status: DC
  Administered 2013-10-02 – 2013-10-06 (×13): 2.5 mg via RESPIRATORY_TRACT
  Filled 2013-10-01 (×14): qty 0.5

## 2013-10-01 MED ORDER — IBUPROFEN 800 MG PO TABS
400.0000 mg | ORAL_TABLET | Freq: Four times a day (QID) | ORAL | Status: DC | PRN
  Administered 2013-10-01 – 2013-10-03 (×2): 400 mg via ORAL
  Filled 2013-10-01 (×2): qty 1

## 2013-10-01 MED ORDER — CHLORHEXIDINE GLUCONATE CLOTH 2 % EX PADS
6.0000 | MEDICATED_PAD | Freq: Every day | CUTANEOUS | Status: DC
  Administered 2013-10-01: 05:00:00 6 via TOPICAL

## 2013-10-01 NOTE — Progress Notes (Signed)
Made NP Benedetto Coons aware of lab results. Will place on Contact Precautions per [facility]

## 2013-10-01 NOTE — Progress Notes (Signed)
ANTIBIOTIC CONSULT NOTE - Follow up  Pharmacy Consult for vancomycin, cefepime renal dose adjustment Indication: pneumonia  No Known Allergies  Patient Measurements: Height: 5\' 2"  (157.5 cm) Weight: 99 lb 10.4 oz (45.2 kg) (45.2kg) IBW/kg (Calculated) : 50.1  Vital Signs: Temp: 97.7 F (36.5 C) (10/03 0542) Temp src: Oral (10/03 0542) BP: 116/48 mmHg (10/03 0542) Pulse Rate: 71 (10/03 0542) Intake/Output from previous day: 10/02 0701 - 10/03 0700 In: 550 [P.O.:300; IV Piggyback:250] Out: 900 [Urine:900]  Labs:  Recent Labs  09/29/13 0441 09/30/13 1342  WBC 10.9*  --   HGB 9.4*  --   PLT 232  --   CREATININE 0.64 0.53   Estimated Creatinine Clearance: 35.4 ml/min (by C-G formula based on Cr of 0.53).   Anti-infectives: 9/30 >> vancomycin  >> 9/30 >> Cefepime >>  Microbiology: 9/30 blood x2 cultures: 1/2 ngtd, 1/2 CGP clusters  Assessment: 62 YOF presents to ED following fall, also c/o SOB.  CXR results per radiologist: Mild left basilar opacity, likely a combination of atelectasis and small left pleural effusion. In July she had laparotomy with small bowel resection for recurrent SBO. Orders for pharmacy to dose vancomycin and cefepime.  Day #4 vancomycin and cefepime  Tmax: afeb  WBCs: 10.9  Renal: SCr = 0.53 for CrCl ~ 35 ml/min (CG)  Noted palliative care consult.  Will assess GOC and plan before ordering trough levels and further labs.  Follow up blood culture with GPCs   Goal of Therapy:  Vancomycin trough level 15-20 mcg/ml  Plan:  Continue Cefepime 1g IV q24h Continue Vancomycin 750mg  IV q24h. Measure Vanc trough at steady state as indicated Follow up renal fxn and culture results.   Lynann Beaver PharmD, BCPS Pager [phone] 10/01/2013 2:25 PM

## 2013-10-01 NOTE — Progress Notes (Signed)
Patient Ariel Hunt      DOB: 1923/11/26      HYQ:[id number]   Palliative Medicine Team at [facility] Progress Note    Subjective: Patient sleeping soundly on both my visits earlier this afternoon and this evening. I met with the patient's son Ariel Hunt and daughter Ariel Hunt at separate times today during their visits to update them on the findings of new gram-positive cocci in her blood. Family is objective about her condition and understands that we may not be able to improve her overall health. We will review the type of bacteria that is involved in her infection and make final decisions on possible full comfort care this weekend. Patient did not complain of further abdominal pain has not had any vomiting. She is taking very little food in. She continues to be confused     Filed Vitals:   10/01/13 1442  BP: 102/55  Pulse: 72  Temp: 98.2 F (36.8 C)  Resp: 18   Physical exam:   Deferred at this time as the patient has been sleeping well   One set of cultures has gram-positive cocci. No followup labs today   Assessment and plan: 79 year old white female with failure to thrive. Recent history of small bowel obstruction status post lysis of adhesions and colectomy with reanastomosis. Postop course was complicated by staph infection related to PICC line. Patient was admitted with confusion and cough found to have a pneumonia. Course is complicated by one cassette of positive blood cultures, elevated pro BNP suggesting possible cardiac source for pulmonary edema and elevated transaminases possibly secondary to passive congestion. Family is overall pursuing a comfort cold but wanted to see if there were any reversible issues. They elect for comfort over the weekend with placement in hospice home if patient does not turn the corner towards meaningful recovery.   1. No CPR but utilize medications for irregular heart rhythms. Communicate with the family regarding hypotension as they may or may  not consider vasopressor support  2. Bacteremia, pneumonia continue antibiotic therapy  3 failure to thrive and malnutrition continue to encourage modified diet with supplements   4. back pain acute on chronic: Continue hydrocodone if patient is able to take oral tablet. She is sensitive to opiates. If she is unable to take an oral tablet we may switch to Roxanol. If her liver enzymes remain elevated in the a.m. we may also discontinue the hydrocodone as it has Tylenol in it.   Greater than 50% of this time was spent on counseling and coordination of care discussing the above goals of care   Total time 45 minutes. 3 PM to 3:25 PM and 7:15 PM to 7: 35 PM   Bitania Shankland L. Ladona Ridgel, MD MBA The Palliative Medicine Team at [facility] Phone: [phone] Pager: [phone]

## 2013-10-01 NOTE — Evaluation (Signed)
Physical Therapy Evaluation Patient Details Name: Ariel Hunt MRN: [id number] DOB: 11/26/1923 Today's Date: 10/01/2013 Time: 4098-1191 PT Time Calculation (min): 19 min  PT Assessment / Plan / Recommendation History of Present Illness   Ariel Hunt is a 79 y.o. female with a past medical history of atrial fibrillation, coronary artery disease, severe aortic stenosis, who was hospitalized back in July and stayed up until early August for small bowel obstruction. She underwent surgery and lysis of adhesions. She was on TPN. She was also found to have MSSA bacteremia related to PICC line infection. She was on IV antibiotics up until August 11 with Ancef. She also developed fluid overload during this hospitalization requiring Lasix. Then she developed tachyarrhythmias with ventricle tachycardia SVT and A. fib. She was initially on amiodarone infusion and IV metoprolol. She has not been started on anticoagulation as yet. She was subsequently sent to rehabilitation. She is accompanied by her daughter today. She came back home from rehabilitation Center about one week ago. She's had 2 falls during this time. She's been in the ED, twice in the last 5 days. She fell on her knees a few days ago. She presented to the ED, underwent x-rays. No fractures were noted, and she was sent back home. According to the daughter she has developed worsening shortness of breath over the last 3-4 days. She's coughing up whitish expectoration. Denies any fever or chills. No nausea, or vomiting. She's lost about 5 pounds in the last one week. Patient has had poor appetite and has been getting progressively weaker as well. Patient was visited by her home health nurse, who recommended that she be brought into the emergency room. Patient also has chronic back pain, secondary to scoliosis. And she was recently started on a fentanyl patch. And according to the daughter that has been contributing to her confusion and she has been  hallucinating at home. When she was brought into the ED, she was found to be mildly hypoxic between 86-89%. With, oxygen she's come up to mid 90s. Patient is very confused and unable to provide any history.  Clinical Impression  *Pt admitted with HCAP*. Pt currently with functional limitations due to the deficits listed below (see PT Problem List). * Pt will benefit from skilled PT to increase their independence and safety with mobility to allow discharge to the venue listed below. *  **    PT Assessment  Patient needs continued PT services    Follow Up Recommendations  SNF;Supervision/Assistance - 24 hour    Does the patient have the potential to tolerate intense rehabilitation      Barriers to Discharge        Equipment Recommendations  None recommended by PT    Recommendations for Other Services     Frequency Min 3X/week    Precautions / Restrictions Precautions Precautions: Fall Precaution Comments: 2 falls at home Restrictions Weight Bearing Restrictions: No   Pertinent Vitals/Pain *unrated back pain Pt premedicated**      Mobility  Bed Mobility Bed Mobility: Supine to Sit Supine to Sit: 1: +1 Total assist Details for Bed Mobility Assistance: pt 15%, assist to advance BLEs and to raise trunk Transfers Transfers: Sit to Stand;Stand to Sit;Stand Pivot Transfers Sit to Stand: 1: +1 Total assist;From bed Stand to Sit: 1: +1 Total assist;To chair/3-in-1 Stand Pivot Transfers: 1: +1 Total assist Details for Transfer Assistance: pt 25%, assist to rise, steady, and control descent for bed to 3 in 1 then to recliner Ambulation/Gait  Ambulation/Gait Assistance: Not tested (comment)    Exercises     PT Diagnosis: Difficulty walking;Generalized weakness  PT Problem List: Decreased strength;Decreased activity tolerance;Decreased balance;Pain;Decreased mobility;Decreased cognition PT Treatment Interventions: DME instruction;Gait training;Functional mobility  training;Therapeutic exercise;Therapeutic activities;Balance training;Patient/family education     PT Goals(Current goals can be found in the care plan section) Acute Rehab PT Goals Patient Stated Goal: to get strength back PT Goal Formulation: With patient Time For Goal Achievement: 10/15/13 Potential to Achieve Goals: Fair  Visit Information  Last PT Received On: 10/01/13 Assistance Needed: +2 History of Present Illness:  Ariel Hunt is a 79 y.o. female with a past medical history of atrial fibrillation, coronary artery disease, severe aortic stenosis, who was hospitalized back in July and stayed up until early August for small bowel obstruction. She underwent surgery and lysis of adhesions. She was on TPN. She was also found to have MSSA bacteremia related to PICC line infection. She was on IV antibiotics up until August 11 with Ancef. She also developed fluid overload during this hospitalization requiring Lasix. Then she developed tachyarrhythmias with ventricle tachycardia SVT and A. fib. She was initially on amiodarone infusion and IV metoprolol. She has not been started on anticoagulation as yet. She was subsequently sent to rehabilitation. She is accompanied by her daughter today. She came back home from rehabilitation Center about one week ago. She's had 2 falls during this time. She's been in the ED, twice in the last 5 days. She fell on her knees a few days ago. She presented to the ED, underwent x-rays. No fractures were noted, and she was sent back home. According to the daughter she has developed worsening shortness of breath over the last 3-4 days. She's coughing up whitish expectoration. Denies any fever or chills. No nausea, or vomiting. She's lost about 5 pounds in the last one week. Patient has had poor appetite and has been getting progressively weaker as well. Patient was visited by her home health nurse, who recommended that she be brought into the emergency room. Patient also  has chronic back pain, secondary to scoliosis. And she was recently started on a fentanyl patch. And according to the daughter that has been contributing to her confusion and she has been hallucinating at home. When she was brought into the ED, she was found to be mildly hypoxic between 86-89%. With, oxygen she's come up to mid 90s. Patient is very confused and unable to provide any history.       Prior Functioning  Home Living Family/patient expects to be discharged to:: Skilled nursing facility Living Arrangements: Children Additional Comments: pt stated she lives with daughter and has an aide, need to confirm this with family Prior Function Level of Independence: Needs assistance Comments: pt confused, unable to provide accurate prior function Communication Communication: HOH    Cognition  Cognition Arousal/Alertness: Awake/alert Behavior During Therapy: WFL for tasks assessed/performed Overall Cognitive Status: No family/caregiver present to determine baseline cognitive functioning (confused (pt asked to be taken out to living room))    Extremity/Trunk Assessment Upper Extremity Assessment Upper Extremity Assessment: Defer to OT evaluation Lower Extremity Assessment Lower Extremity Assessment: Generalized weakness Cervical / Trunk Assessment Cervical / Trunk Assessment: Kyphotic   Balance Balance Balance Assessed: Yes Static Standing Balance Static Standing - Balance Support: Bilateral upper extremity supported Static Standing - Level of Assistance: 2: Max assist Static Standing - Comment/# of Minutes: 1  End of Session PT - End of Session Equipment Utilized During Treatment: Gait  belt;Oxygen Activity Tolerance: Patient limited by fatigue Patient left: in chair;with call bell/phone within reach;with chair alarm set Nurse Communication: Mobility status  GP     Ralene Bathe Kistler 10/01/2013, 10:36 AM [phone]

## 2013-10-01 NOTE — Progress Notes (Signed)
[facility] PROGRESS NOTE  Ariel Hunt ZOX:[id number] DOB: [birth year] DOA: 09/28/2013 PCP:  Duane Lope, MD Brief HPI:   This is 79 year old, Caucasian female, who presents with progressively worsening dyspnea. She is very dehydrated, with severe hyponatremia. she has left lower lobe pneumonia. She was started on IV antibiotics and speech and swallow eval requested. Recommended dysphagia 1 diet.   Assessment/Plan: #1 dyspnea with hypoxia and acute respiratory failure with possible healthcare associated pneumonia: Continue oxygen. We will place her on broad-spectrum antibiotics with vancomycin and cefepime. Swallow evaluation will be obtained.  Recommended dysphagia 1 diet.  Nebulizer treatments will be provided. At this time we will hold off on steroids. This can be considered if there is no improvement.  Blood cultures are positive for gram positive cocci in clusters. Resume vancomycin.   #2 severe dehydration with hyponatremia and failure to thrive: sodium levels have stabilized at 128.  Monitor sodium levels closely.  Monitor urine output.   #3 history of coronary artery disease, Chronic systolic CHF and severe aortic stenosis: These issues appear to be stable. EKG does not show any ischemic changes. EF is 40-45%. Caution with IVF.   #4 history of tachyarrhythmias including V tach, as well as atrial fibrillation: Continue with amiodarone. Monitor her on telemetry. She is not on anticoagulation and just aspirin.   #5 acute encephalopathy:  Much improved. This is most likely due to the metabolic derangements as well as infectious process. Fentanyl patch may have contributed and has been discontinued for now. '  #6 chronic pain due to chronic back issues and arthritis: Resume pain medication as soon as her mental status is back to baseline. We will need to try alternative agents.   #7 history of hypertension: Blood pressure is low normal. We will hold off on all of her antihypertensive  medications, except for the amiodarone. Holding parameters will be utilized. Blood pressure will be monitored closely.   #8 recent small bowel obstruction status post surgical intervention: She does have minimal abdominal discomfort, but abdomen is benign. We will proceed with the 2 view abdominal x-rays which showed mild ileus.   #9 anemia: Hemoglobin is close to baseline.   #10. Chronic lower extremity wounds: Daughter tell me that the wraps were changed just 3 days ago. She tells me that the wounds have healed up. Continue current management. Consult wound care.   Have advised the patient's daughter, that patient seems to be declining. Requested PT eval and palliative care consult.   Elevated liver function tests: unclear etiology, [place] abd does not show any liver lesions, repeat levels show improvement, possibly from passive congestion.    Code Status: limited Family Communication: none at bedside.  Disposition Plan: pending.    Consultants:  Palliative care   Procedures:  none  Antibiotics:  IV cefepime  IV vancomycin.   HPI/Subjective: Is more comfortable today.   Objective: Filed Vitals:   10/01/13 1442  BP: 102/55  Pulse: 72  Temp: 98.2 F (36.8 C)  Resp: 18    Intake/Output Summary (Last 24 hours) at 10/01/13 1640 Last data filed at 10/01/13 1300  Gross per 24 hour  Intake    550 ml  Output    650 ml  Net   -100 ml   Filed Weights   09/28/13 1716 10/01/13 0500  Weight: 39.6 kg (87 lb 4.8 oz) 45.2 kg (99 lb 10.4 oz)    Exam:   General:  Alert afebrile comfortable  Cardiovascular: s1s2  Respiratory: RHONCHI BILATERAL  improved.   Abdomen: soft NT ND BS+  Musculoskeletal: no pedal edema.   Data Reviewed: Basic Metabolic Panel:  Recent Labs Lab 09/28/13 1315 09/29/13 0441 09/30/13 1342  NA 123* 128* 128*  K 4.7 4.0 3.7  CL 91* 98 99  CO2 19 18* 18*  GLUCOSE 101* 72 124*  BUN 32* 27* 23  CREATININE 0.70 0.64 0.53  CALCIUM 8.2* 7.8*  7.9*   Liver Function Tests:  Recent Labs Lab 09/29/13 0441 09/30/13 1342  AST 716* 264*  ALT 635* 569*  ALKPHOS 76 96  BILITOT 1.6* 1.4*  PROT 4.9* 5.2*  ALBUMIN 2.1* 2.1*   No results found for this basename: LIPASE, AMYLASE,  in the last 168 hours No results found for this basename: AMMONIA,  in the last 168 hours CBC:  Recent Labs Lab 09/28/13 1315 09/29/13 0441  WBC 12.6* 10.9*  NEUTROABS 11.1*  --   HGB 10.1* 9.4*  HCT 30.3* 28.7*  MCV 83.9 85.4  PLT 258 232   Cardiac Enzymes: No results found for this basename: CKTOTAL, CKMB, CKMBINDEX, TROPONINI,  in the last 168 hours BNP (last 3 results)  Recent Labs  07/28/13 0415 09/28/13 1426 09/30/13 1342  PROBNP 1586.0* 27885.0* 7860.0*   CBG: No results found for this basename: GLUCAP,  in the last 168 hours  Recent Results (from the past 240 hour(s))  CULTURE, BLOOD (ROUTINE X 2)     Status: None   Collection Time    09/28/13  5:31 PM      Result Value Range Status   Specimen Description BLOOD RIGHT HAND   Final   Special Requests BOTTLES DRAWN AEROBIC AND ANAEROBIC 10CC   Final   Culture  Setup Time     Final   Value: 09/29/2013 01:57     Performed at [facility]   Culture     Final   Value:        BLOOD CULTURE RECEIVED NO GROWTH TO DATE CULTURE WILL BE HELD FOR 5 DAYS BEFORE ISSUING A FINAL NEGATIVE REPORT     Performed at [facility]   Report Status PENDING   Incomplete  CULTURE, BLOOD (ROUTINE X 2)     Status: None   Collection Time    09/28/13  5:32 PM      Result Value Range Status   Specimen Description BLOOD RIGHT ARM   Final   Special Requests BOTTLES DRAWN AEROBIC AND ANAEROBIC 10CC   Final   Culture  Setup Time     Final   Value: 09/29/2013 02:32     Performed at [facility]   Culture     Final   Value: GRAM POSITIVE COCCI IN CLUSTERS     Note: Gram Stain Report Called to,Read Back By and Verified With: J CRUTCHFIELD ON 10/01/2013 AT 12:19A BY WILEJ      Performed at [facility]   Report Status PENDING   Incomplete     Studies: US Abdomen Complete  09/29/2013   *RADIOLOGY REPORT*  Clinical Data:  Elevated LFTs, initial encounter.  COMPLETE ABDOMINAL ULTRASOUND  Comparison:  CT abdomen pelvis - 07/07/2013  Findings:  Gallbladder:  There is layering echogenic debris compatible with sludge within an otherwise normal-appearing gallbladder.  No gallbladder wall thickening or pericholecystic fluid.  Negative sonographic Murphy's sign.  Common bile duct:  Normal in size measuring 5.4 mm in diameter  Liver:  There is mild diffuse slightly coarsened echogenicity of the hepatic parenchyma.  No discrete hepatic lesions.  No definite intrahepatic biliary ductal dilatation.  There is a trace amount of ascites adjacent to the liver edge.  IVC:  Appears normal.  Pancreas:  Limited visualization of the pancreatic head and neck is normal.  Visualization of the pancreatic body and tail is obscured by bowel gas.  Spleen:  Normal in size measuring 5.1 cm in length.  There are multiple punctate echogenic foci within the spleen compatible with granulomas.  There is a trace amount of perisplenic fluid.  Right Kidney:  Normal cortical thickness, echogenicity and size, measuring 8.7 cm in length.  No focal renal lesions.  No echogenic renal stones.  Note is made of a extrarenal pelvis, similar to prior abdominal CT. No urinary obstruction.  Left Kidney:  Normal cortical thickness, echogenicity and size, measuring 10.0 cm in length.  No focal renal lesions.  No echogenic renal stones.  No urinary obstruction.  Abdominal aorta:  No aneurysm identified.  Incidental note is made of small bilateral pleural effusions.  IMPRESSION:  1.  Nonspecific mild slightly coarsened echogenicity of the hepatic parenchyma.  No discrete hepatic lesions. 2.  Layering echogenic sludge with an otherwise normal-appearing gallbladder.  3.  Small bilateral effusions with trace amount of perihepatic  and perisplenic fluid, nonspecific but could be the sequela of volume overload/congestive heart failure.  Clinical correlation is advised.   Original Report Authenticated By: Tacey Ruiz, MD   Dg Abd 2 Views  09/30/2013   CLINICAL DATA:  Abdominal pain. Small bowel obstructions.  EXAM: ABDOMEN - 2 VIEW  COMPARISON:  09/29/2013  FINDINGS: Multiple dilated bowel loops again seen, which show no significant change. Some of this represents colonic dilatation, however dilated small bowel loops cannot be excluded. There is no evidence of free air.  Severe lumbar spine degenerative changes and rotatory scoliosis again demonstrated.  IMPRESSION: No significant change in dilated bowel loops, possibly due to colonic ileus although bowel obstruction cannot definitely be excluded. Continued radiographic followup recommended.   Electronically Signed   By: Myles Rosenthal   On: 09/30/2013 16:05   Dg Abd 2 Views  09/29/2013   CLINICAL DATA:  Abdominal pain  EXAM: ABDOMEN - 2 VIEW  COMPARISON:  09/28/2013  FINDINGS: Distended large and small bowel loops unchanged from the prior study and most consistent with adynamic ileus. No free air.  IMPRESSION: Moderate ileus is unchanged from the prior study.   Electronically Signed   By: Marlan Palau M.D.   On: 09/29/2013 17:14    Scheduled Meds: . albuterol  2.5 mg Nebulization Q6H  . amiodarone  200 mg Oral BID  . aspirin  325 mg Oral Daily  . budesonide  6 mg Oral Q lunch  . ceFEPime (MAXIPIME) IV  1 g Intravenous Q24H  . Chlorhexidine Gluconate Cloth  6 each Topical Q0600  . famotidine  20 mg Oral BID  . furosemide  20 mg Oral Daily  . heparin  5,000 Units Subcutaneous Q8H  . irbesartan  75 mg Oral Daily  . mometasone-formoterol  2 puff Inhalation BID  . mupirocin ointment  1 application Nasal BID  . sodium chloride  3 mL Intravenous Q12H  . vancomycin  750 mg Intravenous Q24H   Continuous Infusions:    Principal Problem:   HCAP (healthcare-associated  pneumonia) Active Problems:   CAD (coronary artery disease)   Anemia   Aortic stenosis, severe   Ventricular tachycardia   Atrial fibrillation   Hyponatremia   FTT (failure  to thrive) in adult   Dehydration   Acute encephalopathy    Time spent: 25 minutes.     [facility]  [facility] Pager [phone] If 7PM-7AM, please contact night-coverage at [email or web address], password [facility] 10/01/2013, 4:40 PM  LOS: 3 days

## 2013-10-01 NOTE — Progress Notes (Signed)
PMT follow-up: patient seen no family at bedside, patient awake oriented to self only, told writer she was 'with the children' and 'we're going home on the airplane , i have a round trip ticket" " I was sick with pneumonia- I'll go back to the doctor "  Patient seeing other people in the room (no other people present) when asked she cannot tell writer who they are- stated she "hurt all over" - asked to go to bed and for head to be lowered so she can sleep-patient already in the bed and whenthe head of the bed lowered she stated that was more comfortable; patient asked for writer to pray for her and with her;  -patient has received Hydrocodone 1 tablet @ 8:30 and Clinical research associate discussed current patient report of pain with staff RN Tess requesting to medicate with current prn order; also spoke with Chaplain to request supportive visit with patient. PMT MD to see patient later today to further evaluate  Valente David, RN 10/01/2013, 1:45 PM Palliative Medicine Team RN Liaison [phone]

## 2013-10-01 NOTE — Progress Notes (Signed)
Received call from [facility]  Blood cultures x 2 po Will text NP on call with results. sitive for Gram + Cocci in clusters.

## 2013-10-01 NOTE — Progress Notes (Signed)
Son also in room with patient at time of visit. Patient is [employer]. She often asks for prayer from those who come into the room. She also asked me and I prayed according to her request. She also told me she is "not afraid to die" and told me about those who have gone on before her and are waiting for her. It was a pleasant visit. The patient was upbeat and filled with faith. Will continue to support.

## 2013-10-02 ENCOUNTER — Other Ambulatory Visit (HOSPITAL_COMMUNITY): Payer: Medicare Other

## 2013-10-02 DIAGNOSIS — M549 Dorsalgia, unspecified: Secondary | ICD-10-CM

## 2013-10-02 DIAGNOSIS — I509 Heart failure, unspecified: Secondary | ICD-10-CM

## 2013-10-02 LAB — COMPREHENSIVE METABOLIC PANEL
ALT: 319 U/L — ABNORMAL HIGH (ref 0–35)
AST: 81 U/L — ABNORMAL HIGH (ref 0–37)
Alkaline Phosphatase: 116 U/L (ref 39–117)
BUN: 20 mg/dL (ref 6–23)
CO2: 19 mEq/L (ref 19–32)
Calcium: 8.5 mg/dL (ref 8.4–10.5)
GFR calc Af Amer: >90 mL/min (ref >90)
GFR calc non Af Amer: 86 mL/min — ABNORMAL LOW (ref >90)
Potassium: 4.4 mEq/L (ref 3.5–5.1)
Sodium: 133 mEq/L — ABNORMAL LOW (ref 135–145)
Total Bilirubin: 1.4 mg/dL — ABNORMAL HIGH (ref 0.3–1.2)
Total Protein: 5 g/dL — ABNORMAL LOW (ref 6.0–8.3)

## 2013-10-02 LAB — CBC
HCT: 29.5 % — ABNORMAL LOW (ref 36.0–46.0)
Hemoglobin: 10.1 g/dL — ABNORMAL LOW (ref 12.0–15.0)
MCH: 28.5 pg (ref 26.0–34.0)
MCHC: 34.2 g/dL (ref 30.0–36.0)
Platelets: 232 10*3/uL (ref 150–400)

## 2013-10-02 MED ORDER — HYDROCODONE-ACETAMINOPHEN 5-325 MG PO TABS
1.0000 | ORAL_TABLET | Freq: Four times a day (QID) | ORAL | Status: DC | PRN
  Administered 2013-10-02: 1 via ORAL
  Administered 2013-10-03: 2 via ORAL
  Filled 2013-10-02: qty 2
  Filled 2013-10-02: qty 1

## 2013-10-02 MED ORDER — VANCOMYCIN HCL IN DEXTROSE 1-5 GM/200ML-% IV SOLN
1000.0000 mg | INTRAVENOUS | Status: DC
Start: 2013-10-02 — End: 2013-10-04
  Administered 2013-10-02 – 2013-10-03 (×2): 1000 mg via INTRAVENOUS
  Filled 2013-10-02 (×3): qty 200

## 2013-10-02 NOTE — Progress Notes (Signed)
Rx Brief Antibiotic note:  Vancomycin  Assessement:  VT= 7.4 mg/L after 750mg  q24h @ Css  Below goal (15-20)  SCr stable  Afebrile  Only 3 doses left ( 7 days total)  Plan:  Increase Vancomycin to 1 Gm IV q24h  F/U SCr/levels if needed  Lorenza Evangelist 10/02/2013 8:34 PM

## 2013-10-02 NOTE — Progress Notes (Addendum)
[facility] PROGRESS NOTE  SHELIE LANSING NWG:[id number] DOB: June 21, 1923 DOA: 09/28/2013 PCP:  Duane Lope, MD Brief HPI:   This is 79 year old, Caucasian female, who presents with progressively worsening dyspnea. She is very dehydrated, with severe hyponatremia. she has left lower lobe pneumonia. She was started on IV antibiotics and speech and swallow eval requested. Recommended dysphagia 1 diet.   Assessment/Plan: #1 dyspnea with hypoxia and acute respiratory failure with possible healthcare associated pneumonia: Continue oxygen. We will place her on broad-spectrum antibiotics with vancomycin and cefepime. Swallow evaluation will be obtained.  Recommended dysphagia 1 diet.  Nebulizer treatments will be provided. At this time we will hold off on steroids. This can be considered if there is no improvement.  Blood cultures are positive for gram positive cocci in clusters, which were identified as staph aureus, sensitivities pending. Marland Kitchen Resume vancomycin.   #2 severe dehydration with hyponatremia and failure to thrive: sodium levels have stabilized at 128.  Monitor sodium levels closely.  Monitor urine output.   #3 history of coronary artery disease, Chronic systolic CHF and severe aortic stenosis: These issues appear to be stable. EKG does not show any ischemic changes. EF is 40-45%. Caution with IVF.   #4 history of tachyarrhythmias including V tach, as well as atrial fibrillation: Continue with amiodarone. Monitor her on telemetry. She is not on anticoagulation and just aspirin.   #5 acute encephalopathy:  Much improved. This is most likely due to the metabolic derangements as well as infectious process. Fentanyl patch may have contributed and has been discontinued for now. '  #6 chronic pain due to chronic back issues and arthritis: Resume pain medication as soon as her mental status is back to baseline. Xray LS spine ordered.   #7 history of hypertension: Blood pressure is low normal. We  will hold off on all of her antihypertensive medications, except for the amiodarone. Holding parameters will be utilized. Blood pressure will be monitored closely.   #8 recent small bowel obstruction status post surgical intervention: She does have minimal abdominal discomfort, but abdomen is benign. We will proceed with the 2 view abdominal x-rays which showed mild ileus.   #9 anemia: Hemoglobin is close to baseline.   #10. Chronic lower extremity wounds: Daughter tell me that the wraps were changed just 3 days ago. She tells me that the wounds have healed up. Continue current management. Consult wound care.   Have advised the patient's daughter, that patient seems to be declining. Requested PT eval and palliative care consult.   Elevated liver function tests: unclear etiology, [place] abd does not show any liver lesions, repeat levels show improvement, possibly from passive congestion.    Code Status: limited Family Communication: none at bedside.  Disposition Plan: pending.    Consultants:  Palliative care   Procedures:  none  Antibiotics:  IV cefepime  IV vancomycin.   HPI/Subjective: No complaints, she is asking if she will survive this hospital stay.   Objective: Filed Vitals:   10/02/13 0514  BP: 130/70  Pulse: 66  Temp: 97.7 F (36.5 C)  Resp: 16    Intake/Output Summary (Last 24 hours) at 10/02/13 1504 Last data filed at 10/02/13 1237  Gross per 24 hour  Intake    293 ml  Output    870 ml  Net   -577 ml   Filed Weights   09/28/13 1716 10/01/13 0500 10/02/13 0514  Weight: 39.6 kg (87 lb 4.8 oz) 45.2 kg (99 lb 10.4 oz) 51.483 kg (  113 lb 8 oz)    Exam:   General:  Alert afebrile comfortable  Cardiovascular: s1s2  Respiratory: RHONCHI BILATERAL improved.   Abdomen: soft NT ND BS+  Musculoskeletal: no pedal edema.   Data Reviewed: Basic Metabolic Panel:  Recent Labs Lab 09/28/13 1315 09/29/13 0441 09/30/13 1342 10/02/13 0500  NA 123* 128*  128* 133*  K 4.7 4.0 3.7 4.4  CL 91* 98 99 106  CO2 19 18* 18* 19  GLUCOSE 101* 72 124* 116*  BUN 32* 27* 23 20  CREATININE 0.70 0.64 0.53 0.48*  CALCIUM 8.2* 7.8* 7.9* 8.5   Liver Function Tests:  Recent Labs Lab 09/29/13 0441 09/30/13 1342 10/02/13 0500  AST 716* 264* 81*  ALT 635* 569* 319*  ALKPHOS 76 96 116  BILITOT 1.6* 1.4* 1.4*  PROT 4.9* 5.2* 5.0*  ALBUMIN 2.1* 2.1* 2.0*   No results found for this basename: LIPASE, AMYLASE,  in the last 168 hours No results found for this basename: AMMONIA,  in the last 168 hours CBC:  Recent Labs Lab 09/28/13 1315 09/29/13 0441 10/02/13 0500  WBC 12.6* 10.9* 11.4*  NEUTROABS 11.1*  --   --   HGB 10.1* 9.4* 10.1*  HCT 30.3* 28.7* 29.5*  MCV 83.9 85.4 83.1  PLT 258 232 232   Cardiac Enzymes: No results found for this basename: CKTOTAL, CKMB, CKMBINDEX, TROPONINI,  in the last 168 hours BNP (last 3 results)  Recent Labs  07/28/13 0415 09/28/13 1426 09/30/13 1342  PROBNP 1586.0* 27885.0* 7860.0*   CBG: No results found for this basename: GLUCAP,  in the last 168 hours  Recent Results (from the past 240 hour(s))  CULTURE, BLOOD (ROUTINE X 2)     Status: None   Collection Time    09/28/13  5:31 PM      Result Value Range Status   Specimen Description BLOOD RIGHT HAND   Final   Special Requests BOTTLES DRAWN AEROBIC AND ANAEROBIC 10CC   Final   Culture  Setup Time     Final   Value: 09/29/2013 01:57     Performed at [facility]   Culture     Final   Value: STAPHYLOCOCCUS AUREUS     Note: RIFAMPIN AND GENTAMICIN SHOULD NOT BE USED AS SINGLE DRUGS FOR TREATMENT OF STAPH INFECTIONS.     Note: Gram Stain Report Called to,Read Back By and Verified With: J CRUTCHFIELD ON 10/01/2013 AT 12:19A BY WILEJ     Performed at [facility]   Report Status PENDING   Incomplete  CULTURE, BLOOD (ROUTINE X 2)     Status: None   Collection Time    09/28/13  5:32 PM      Result Value Range Status   Specimen  Description BLOOD RIGHT ARM   Final   Special Requests BOTTLES DRAWN AEROBIC AND ANAEROBIC 10CC   Final   Culture  Setup Time     Final   Value: 09/29/2013 02:32     Performed at [facility]   Culture     Final   Value: STAPHYLOCOCCUS AUREUS     Note: Gram Stain Report Called to,Read Back By and Verified With: J CRUTCHFIELD ON 10/01/2013 AT 12:19A BY WILEJ     Performed at [facility]   Report Status PENDING   Incomplete     Studies: Dg Chest Port 1 View  10/01/2013   CLINICAL DATA:  Pneumonia  EXAM: PORTABLE CHEST - 1 VIEW  COMPARISON:  09/28/2010  FINDINGS: Persistent left retrocardiac density is noted with associated small effusion. The lungs are otherwise clear. Degenerative changes are noted in the thoracic spine with multiple compression deformities. No acute bony abnormality is seen.  IMPRESSION: Stable appearance of left basilar infiltrate.   Electronically Signed   By: Alcide Clever M.D.   On: 10/01/2013 18:44   Dg Abd 2 Views  09/30/2013   CLINICAL DATA:  Abdominal pain. Small bowel obstructions.  EXAM: ABDOMEN - 2 VIEW  COMPARISON:  09/29/2013  FINDINGS: Multiple dilated bowel loops again seen, which show no significant change. Some of this represents colonic dilatation, however dilated small bowel loops cannot be excluded. There is no evidence of free air.  Severe lumbar spine degenerative changes and rotatory scoliosis again demonstrated.  IMPRESSION: No significant change in dilated bowel loops, possibly due to colonic ileus although bowel obstruction cannot definitely be excluded. Continued radiographic followup recommended.   Electronically Signed   By: Myles Rosenthal   On: 09/30/2013 16:05    Scheduled Meds: . albuterol  2.5 mg Nebulization TID  . amiodarone  200 mg Oral BID  . aspirin  325 mg Oral Daily  . budesonide  6 mg Oral Q lunch  . ceFEPime (MAXIPIME) IV  1 g Intravenous Q24H  . famotidine  20 mg Oral BID  . furosemide  20 mg Oral Daily  . heparin   5,000 Units Subcutaneous Q8H  . irbesartan  75 mg Oral Daily  . mometasone-formoterol  2 puff Inhalation BID  . sodium chloride  3 mL Intravenous Q12H  . vancomycin  750 mg Intravenous Q24H   Continuous Infusions:    Principal Problem:   HCAP (healthcare-associated pneumonia) Active Problems:   CAD (coronary artery disease)   Anemia   Aortic stenosis, severe   Ventricular tachycardia   Atrial fibrillation   Hyponatremia   FTT (failure to thrive) in adult   Dehydration   Acute encephalopathy    Time spent: 25 minutes.     [facility]  [facility] Pager [phone] If 7PM-7AM, please contact night-coverage at [email or web address], password [facility] 10/02/2013, 3:04 PM  LOS: 4 days

## 2013-10-02 NOTE — Progress Notes (Signed)
Patient AV:WUJWJX H Earlywine      DOB: [birth year]      BJY:[id number]   Palliative Medicine Team at [facility] Progress Note    Subjective: Patient reproted to have been awake and interacting with family earlier, now asleep .  Met with daughter [name] and son form out of town Jorja Loa and his wife Marcelino Duster to discuss options. Patient grew Staph aureus in blood.  Concern for osteomyelitis or epidural involvement in light of severe back pain symptoms  .  Patient is too frail to evaluate and family does not want to do so .  They are thinking on the lines of proactive treatment but weighing the risks of cdiff colitis.  They needed to talk further before making decisions.  For now continue treatment but aggressively treat symptoms.  Patient with no further complaints of abdomen pain, and she did eat a little bit   Filed Vitals:   10/02/13 0514  BP: 130/70  Pulse: 66  Temp: 97.7 F (36.5 C)  Resp: 16   Physical exam:    Exam other than observation of no distress and no significant distention of abdomen. defered    Assessment and plan 79 year old white female with failure to thrive. Recent history of small bowel obstruction status post lysis of adhesions and colectomy with reanastomosis. Postop course was complicated by staph infection related to PICC line. Patient was admitted with confusion and cough found to have a pneumonia. Course is complicated by one set of positive blood cultures, elevated pro BNP suggesting possible cardiac source for pulmonary edema and elevated transaminases possibly secondary to passive congestion. Family is overall pursuing a comfort care but wanted to see if there were any reversible issues. They may elect for comfort over the weekend with placement in hospice home if patient does not turn the corner towards meaningful recovery.  1. No CPR but utilize medications for irregular heart rhythms. Communicate with the family regarding hypotension as they may or may not consider  vasopressor support  2. Bacteremia, pneumonia continue antibiotic therapy  3 failure to thrive and malnutrition continue to encourage modified diet with supplements  4. back pain acute on chronic: Continue hydrocodone if patient is able to take oral tablet. She is sensitive to opiates. If she is unable to take an oral tablet we may switch to Roxanol. If her liver enzymes remain elevated in the a.m. we may also discontinue the hydrocodone as it has Tylenol in it.  Greater than 50% of this time was spent on counseling and coordination of care discussing the above goals of care  5.  No MRI or xray per family  Total time 20 min , 200 -220 pm   Trev Boley L. Ladona Ridgel, MD MBA The Palliative Medicine Team at [facility] Phone: [phone] Pager: [phone]

## 2013-10-02 NOTE — Progress Notes (Signed)
Pt has Staph aureus bacteremia She is currently on vancomycin.  Pt is being eval for comfort care.  Will not activate "CHAMP" protocol for staph bacteremia.  Please let us know if a formal consult is needed. Thanks Johny Sax, MD

## 2013-10-02 NOTE — Progress Notes (Signed)
Provided Pt, daughter and son with bed offers.  Pt's son met with representative at [facility] and Pt will return there, if SNF becomes the plan.  To that end, family unsure if Pt will need SNF or residential Hospice upon d/c.  Family, along with Dr. Ladona Ridgel, to see how Pt progresses over the weekend to make d/c decision.  If they decide that residential Hospice is the best choice, Pt and family request [facility].  Weekday CSW to follow.  Providence Crosby, LCSWA Clinical Social Work [phone]

## 2013-10-03 DIAGNOSIS — R7881 Bacteremia: Secondary | ICD-10-CM

## 2013-10-03 DIAGNOSIS — B958 Unspecified staphylococcus as the cause of diseases classified elsewhere: Secondary | ICD-10-CM

## 2013-10-03 LAB — CULTURE, BLOOD (ROUTINE X 2)

## 2013-10-03 MED ORDER — OXYCODONE-ACETAMINOPHEN 5-325 MG PO TABS
1.0000 | ORAL_TABLET | Freq: Four times a day (QID) | ORAL | Status: DC | PRN
  Administered 2013-10-03 – 2013-10-04 (×3): 1 via ORAL
  Filled 2013-10-03 (×3): qty 1

## 2013-10-03 MED ORDER — HYDROCODONE-ACETAMINOPHEN 5-325 MG PO TABS
1.0000 | ORAL_TABLET | ORAL | Status: DC | PRN
  Administered 2013-10-03 (×3): 2 via ORAL
  Filled 2013-10-03 (×3): qty 2

## 2013-10-03 NOTE — Progress Notes (Addendum)
Visit is a follow up to earlier visits by staff chaplains.  Ms Molenda was upbeat, speaking for her children's arrival soon. She says "I am ready to go, I have had a good life." She says she is in physical pain all of the time, but she has received great care from everyone. She says her church is in conflict right now and asks for prayer for the church she loves. She is not conflicted over her community of faith being in some distress because she does not foresee ever worshiping there again. She envisions dying in our care, surrounded by her family. We spoke of "going to church in heaven with all that went before her." She smiled and said that will be wonderful.  Ms Denard is a person of prayer, request those that will to pray with her. Our prayers were for comfort and peace to make the transition as a person who loves God. No doubt those prayers will be answered fully.  Ms Brickman had a spiritual friend with her at the time of my visit. She finds in this relationship and all the staff that shares her beliefs in God to be her community of faith. Her spiritual friends and guides are also the chaplains that visit her and pray with her. She is a person who does not separate her spiritual self from any other part of her self. Pain has spiritual implications. For her constant pain that will not leave her body is a call to heaven.  Given her being prepared for her eventual death, and her peace that she is neither afraid nor burdened with regrets, there is little to recommend other than to continue to provide our usual excellent palliative care. If there is any evidence of possible future spiritual pain it will be in her dashing her desire to have family present for final good byes with the time comes for her to die.  Benjie Karvonen. Palmer Fahrner, DMin, MDiv, MA Chaplain

## 2013-10-03 NOTE — Plan of Care (Signed)
Problem: Phase II Progression Outcomes Goal: Walk in hall or up in chair TID Outcome: Not Applicable Date Met:  10/03/13 Pt total care and now Hospice

## 2013-10-03 NOTE — Progress Notes (Signed)
[facility] PROGRESS NOTE  Ariel Hunt ZOX:[id number] DOB: 1923-11-04 DOA: 09/28/2013 PCP:  Duane Lope, MD Brief HPI:   This is 79 year old, Caucasian female, who presents with progressively worsening dyspnea. She is very dehydrated, with severe hyponatremia. she has left lower lobe pneumonia. She was started on IV antibiotics and speech and swallow eval requested. Recommended dysphagia 1 diet.   Assessment/Plan: #1 dyspnea with hypoxia and acute respiratory failure with possible healthcare associated pneumonia: Continue oxygen. We will place her on broad-spectrum antibiotics with vancomycin and cefepime. Swallow evaluation  obtained.  Recommended dysphagia 1 diet.  Nebulizer treatments will be provided. At this time we will hold off on steroids. This can be considered if there is no improvement.  Blood cultures are positive for gram positive cocci in clusters, which were identified as staph aureus, pan sensitive. Resume vancomycin.   #2 severe dehydration with hyponatremia and failure to thrive: sodium levels have stabilized at 128.  Monitor sodium levels closely.  Monitor urine output.   #3 history of coronary artery disease, Chronic systolic CHF and severe aortic stenosis: These issues appear to be stable. EKG does not show any ischemic changes. EF is 40-45%. Caution with IVF.   #4 history of tachyarrhythmias including V tach, as well as atrial fibrillation: Continue with amiodarone. Monitor her on telemetry. She is not on anticoagulation and just aspirin.   #5 acute encephalopathy:  Much improved. This is most likely due to the metabolic derangements as well as infectious process. Fentanyl patch may have contributed and has been discontinued for now. '  #6 chronic pain due to chronic back issues and arthritis: Resume pain medication as soon as her mental status is back to baseline. Xray LS spine ordered.   #7 history of hypertension: Blood pressure is low normal. We will hold off on  all of her antihypertensive medications, except for the amiodarone. Holding parameters will be utilized. Blood pressure will be monitored closely.   #8 recent small bowel obstruction status post surgical intervention: She does have minimal abdominal discomfort, but abdomen is benign. We will proceed with the 2 view abdominal x-rays which showed mild ileus.   #9 anemia: Hemoglobin is close to baseline.   #10. Chronic lower extremity wounds: Daughter tell me that the wraps were changed just 3 days ago. She tells me that the wounds have healed up. Continue current management.   Have advised the patient's daughter, that patient seems to be declining. Requested PT eval and palliative care consult. Palliative care consulted for goals of care, plan is to see how she does over the weekend and meet again tomorrow and reassess. On further discussions with the patient, she reports, she is in too much pain for any further testing and just wants survive this hospital visit, doesn't want any more testing at this time, and wants to go home.   Elevated liver function tests: unclear etiology, [place] abd does not show any liver lesions, repeat levels show improvement, possibly from passive congestion.    Code Status: limited Family Communication: none at bedside. Spoke to the son yesterday and today.  Disposition Plan: pending tomrrow meeting with the family.     Consultants:  Palliative care   Procedures:  none  Antibiotics:  IV cefepime  IV vancomycin.   HPI/Subjective: Much pain today. Increased the frequency of her oxy. Will need to add roxanol if her pain is not well controlled.   Objective: Filed Vitals:   10/03/13 1330  BP: 125/70  Pulse: 70  Temp: 97.8 F (36.6 C)  Resp: 15    Intake/Output Summary (Last 24 hours) at 10/03/13 1813 Last data filed at 10/03/13 1733  Gross per 24 hour  Intake    203 ml  Output    950 ml  Net   -747 ml   Filed Weights   10/01/13 0500 10/02/13 0514  10/03/13 0728  Weight: 45.2 kg (99 lb 10.4 oz) 51.483 kg (113 lb 8 oz) 44.5 kg (98 lb 1.7 oz)    Exam:   General:  Alert afebrile comfortable  Cardiovascular: s1s2  Respiratory: RHONCHI BILATERAL improved.   Abdomen: soft NT ND BS+  Musculoskeletal: no pedal edema.   Data Reviewed: Basic Metabolic Panel:  Recent Labs Lab 09/28/13 1315 09/29/13 0441 09/30/13 1342 10/02/13 0500  NA 123* 128* 128* 133*  K 4.7 4.0 3.7 4.4  CL 91* 98 99 106  CO2 19 18* 18* 19  GLUCOSE 101* 72 124* 116*  BUN 32* 27* 23 20  CREATININE 0.70 0.64 0.53 0.48*  CALCIUM 8.2* 7.8* 7.9* 8.5   Liver Function Tests:  Recent Labs Lab 09/29/13 0441 09/30/13 1342 10/02/13 0500  AST 716* 264* 81*  ALT 635* 569* 319*  ALKPHOS 76 96 116  BILITOT 1.6* 1.4* 1.4*  PROT 4.9* 5.2* 5.0*  ALBUMIN 2.1* 2.1* 2.0*   No results found for this basename: LIPASE, AMYLASE,  in the last 168 hours No results found for this basename: AMMONIA,  in the last 168 hours CBC:  Recent Labs Lab 09/28/13 1315 09/29/13 0441 10/02/13 0500  WBC 12.6* 10.9* 11.4*  NEUTROABS 11.1*  --   --   HGB 10.1* 9.4* 10.1*  HCT 30.3* 28.7* 29.5*  MCV 83.9 85.4 83.1  PLT 258 232 232   Cardiac Enzymes: No results found for this basename: CKTOTAL, CKMB, CKMBINDEX, TROPONINI,  in the last 168 hours BNP (last 3 results)  Recent Labs  07/28/13 0415 09/28/13 1426 09/30/13 1342  PROBNP 1586.0* 27885.0* 7860.0*   CBG: No results found for this basename: GLUCAP,  in the last 168 hours  Recent Results (from the past 240 hour(s))  CULTURE, BLOOD (ROUTINE X 2)     Status: None   Collection Time    09/28/13  5:31 PM      Result Value Range Status   Specimen Description BLOOD RIGHT HAND   Final   Special Requests BOTTLES DRAWN AEROBIC AND ANAEROBIC 10CC   Final   Culture  Setup Time     Final   Value: 09/29/2013 01:57     Performed at [facility]   Culture     Final   Value: STAPHYLOCOCCUS AUREUS     Note:  RIFAMPIN AND GENTAMICIN SHOULD NOT BE USED AS SINGLE DRUGS FOR TREATMENT OF STAPH INFECTIONS.     Note: Gram Stain Report Called to,Read Back By and Verified With: J CRUTCHFIELD ON 10/01/2013 AT 12:19A BY WILEJ     Performed at [facility]   Report Status 10/03/2013 FINAL   Final   Organism ID, Bacteria STAPHYLOCOCCUS AUREUS   Final  CULTURE, BLOOD (ROUTINE X 2)     Status: None   Collection Time    09/28/13  5:32 PM      Result Value Range Status   Specimen Description BLOOD RIGHT ARM   Final   Special Requests BOTTLES DRAWN AEROBIC AND ANAEROBIC 10CC   Final   Culture  Setup Time     Final   Value: 09/29/2013 02:32  Performed at [facility]     Final   Value: STAPHYLOCOCCUS AUREUS     Note: SUSCEPTIBILITIES PERFORMED ON PREVIOUS CULTURE WITHIN THE LAST 5 DAYS.     Note: Gram Stain Report Called to,Read Back By and Verified With: J CRUTCHFIELD ON 10/01/2013 AT 12:19A BY Serafina Mitchell     Performed at [facility]   Report Status 10/03/2013 FINAL   Final     Studies: No results found.  Scheduled Meds: . albuterol  2.5 mg Nebulization TID  . amiodarone  200 mg Oral BID  . aspirin  325 mg Oral Daily  . budesonide  6 mg Oral Q lunch  . ceFEPime (MAXIPIME) IV  1 g Intravenous Q24H  . famotidine  20 mg Oral BID  . furosemide  20 mg Oral Daily  . heparin  5,000 Units Subcutaneous Q8H  . irbesartan  75 mg Oral Daily  . mometasone-formoterol  2 puff Inhalation BID  . sodium chloride  3 mL Intravenous Q12H  . vancomycin  1,000 mg Intravenous Q24H   Continuous Infusions:    Principal Problem:   HCAP (healthcare-associated pneumonia) Active Problems:   CAD (coronary artery disease)   Anemia   Aortic stenosis, severe   Ventricular tachycardia   Atrial fibrillation   Hyponatremia   FTT (failure to thrive) in adult   Dehydration   Acute encephalopathy    Time spent: 25 minutes.     [facility]  [facility] Pager [phone] If  7PM-7AM, please contact night-coverage at [email or web address], password [facility] 10/03/2013, 6:13 PM  LOS: 5 days

## 2013-10-04 LAB — COMPREHENSIVE METABOLIC PANEL
AST: 38 U/L — ABNORMAL HIGH (ref 0–37)
Albumin: 1.9 g/dL — ABNORMAL LOW (ref 3.5–5.2)
Alkaline Phosphatase: 123 U/L — ABNORMAL HIGH (ref 39–117)
BUN: 16 mg/dL (ref 6–23)
Calcium: 8.1 mg/dL — ABNORMAL LOW (ref 8.4–10.5)
Chloride: 101 mEq/L (ref 96–112)
Potassium: 4 mEq/L (ref 3.5–5.1)
Sodium: 131 mEq/L — ABNORMAL LOW (ref 135–145)
Total Bilirubin: 0.8 mg/dL (ref 0.3–1.2)
Total Protein: 4.8 g/dL — ABNORMAL LOW (ref 6.0–8.3)

## 2013-10-04 LAB — CBC
Platelets: 262 10*3/uL (ref 150–400)
RDW: 18.4 % — ABNORMAL HIGH (ref 11.5–15.5)
WBC: 11.9 10*3/uL — ABNORMAL HIGH (ref 4.0–10.5)

## 2013-10-04 MED ORDER — LIDOCAINE 5 % EX PTCH
1.0000 | MEDICATED_PATCH | CUTANEOUS | Status: DC
Start: 2013-10-04 — End: 2013-10-06
  Administered 2013-10-04 – 2013-10-05 (×2): 1 via TRANSDERMAL
  Filled 2013-10-04 (×3): qty 1

## 2013-10-04 MED ORDER — OXYCODONE-ACETAMINOPHEN 5-325 MG PO TABS: 2.0000 | ORAL_TABLET | ORAL | Status: DC | PRN

## 2013-10-04 MED ORDER — ENSURE PUDDING PO PUDG
1.0000 | Freq: Three times a day (TID) | ORAL | Status: DC
  Administered 2013-10-05 – 2013-10-06 (×5): 1 via ORAL
  Filled 2013-10-04 (×8): qty 1

## 2013-10-04 MED ORDER — MORPHINE SULFATE (CONCENTRATE) 10 MG /0.5 ML PO SOLN
5.0000 mg | ORAL | Status: DC | PRN
  Administered 2013-10-04 (×2): 5 mg via ORAL
  Filled 2013-10-04 (×2): qty 0.5

## 2013-10-04 MED ORDER — MORPHINE SULFATE (CONCENTRATE) 10 MG /0.5 ML PO SOLN
5.0000 mg | ORAL | Status: DC | PRN
  Administered 2013-10-04 – 2013-10-06 (×6): 5 mg via ORAL
  Filled 2013-10-04 (×8): qty 0.5

## 2013-10-04 MED ORDER — MORPHINE SULFATE (CONCENTRATE) 10 MG /0.5 ML PO SOLN
5.0000 mg | ORAL | Status: DC
  Administered 2013-10-04 – 2013-10-06 (×10): 5 mg via ORAL
  Filled 2013-10-04 (×9): qty 0.5

## 2013-10-04 NOTE — Progress Notes (Signed)
Patient WU:JWJXBJ JANISE GORA      DOB: Mar 10, 1923      YNW:[id number]   Emotional support offered. Patient not doing well .  Family working to talk each other through right choices.  Devora Tortorella L. Ladona Ridgel, MD MBA The Palliative Medicine Team at [facility] Phone: [phone] Pager: [phone]

## 2013-10-04 NOTE — Progress Notes (Signed)
Patient ZO:XWRUEA H Hunt      DOB: [birth year]      VWU:[id number]   Palliative Medicine Team at [facility] Progress Note    Subjective: Patient reports pain was terrible last night.  She has been telling her children that she does not want to suffer .  She has told them she does not want a picc line.  They are now all in agreement for hospice placement. The patient lost her IV access and they were waiting to replace it when I reminded them that Hospice will not be continuing long term antibiotics.  Family has decided to forgo long term antibiotics and since we would need to stick her again just for the hospital stay they have decided not to do that to her.  With regard to her pain  Will use scheduled roxanol with prn for breakthrough.    Filed Vitals:   10/04/13 1733  BP: 118/74  Pulse: 70  Temp: 98.3 F (36.8 C)  Resp: 16   Physical exam:  General : no acute distress at present, talking and comfortable Some rhonchi anteriorly   Assessment and plan: 79 yr old white female admitted with likely aspiration pneumonia and back pain. Course complicated by S. Aureus bacteremia  1.  DNR  2.  No further antibiotics.  No further IV  3.  Pain:  Schedule Roxanol q 4 hrs with q 2hrs for breakthrough  4.  Comfort feed  Disposition:  Family decided on Residential Hospice placement [place] contacted by SW no bed available today.  Discussed with Ariel Hunt  Total time 45 min 715 pm- 8 pm  Ariel Garverick L. Ladona Ridgel, MD MBA The Palliative Medicine Team at [facility] Phone: [phone] Pager: [phone]

## 2013-10-04 NOTE — Progress Notes (Signed)
SLP Cancellation Note  Patient Details Name: Ariel Hunt MRN: [id number] DOB: 10/31/1923   Cancelled treatment:        Decisions re: GOC pending.  Nursing reports son is in room with patient and has requested that she not be disturbed, as she is finally sleeping, after having a sleepless night.  SLP will f/u as needed for further family education.   Maryjo Rochester T 10/04/2013, 3:22 PM

## 2013-10-04 NOTE — Progress Notes (Signed)
PT Cancellation Note  Patient Details Name: Ariel Hunt MRN: [id number] DOB: Nov 11, 1923   Cancelled Treatment:    Reason Eval/Treat Not Completed: Pain limiting ability to participate. RN aware. Will follow.   Ralene Bathe Kistler 10/04/2013, 10:41 AM [phone]

## 2013-10-04 NOTE — Progress Notes (Signed)
[facility] PROGRESS NOTE  Ariel Hunt ZOX:[id number] DOB: [birth year] DOA: 09/28/2013 PCP:  Duane Lope, MD Brief HPI:   This is 79 year old, Caucasian female, who presents with progressively worsening dyspnea. She is very dehydrated, with severe hyponatremia. she has left lower lobe pneumonia. She was started on IV antibiotics and speech and swallow eval requested. Recommended dysphagia 1 diet.   Assessment/Plan: #1 dyspnea with hypoxia and acute respiratory failure with possible healthcare associated pneumonia: Continue oxygen. We will place her on broad-spectrum antibiotics with vancomycin and cefepime. Swallow evaluation  obtained.  Recommended dysphagia 1 diet.  Nebulizer treatments will be provided. At this time we will hold off on steroids. This can be considered if there is no improvement.  Blood cultures are positive for gram positive cocci in clusters, which were identified as staph aureus, pan sensitive. Resume vancomycin.   #2 severe dehydration with hyponatremia and failure to thrive: sodium levels have stabilized at 131.  #3 history of coronary artery disease, Chronic systolic CHF and severe aortic stenosis: These issues appear to be stable. EKG does not show any ischemic changes. EF is 40-45%. Caution with IVF.   #4 history of tachyarrhythmias including V tach, as well as atrial fibrillation: Continue with amiodarone.  She is not on anticoagulation and just aspirin.   #5 acute encephalopathy:  Much improved. This is most likely due to the metabolic derangements as well as infectious process. Fentanyl patch may have contributed and has been discontinued for now.   #6 chronic pain due to chronic back issues and arthritis: Resume pain medication as soon as her mental status is back to baseline.   #7 history of hypertension: Blood pressure is low normal. We will hold off on all of her antihypertensive medications, except for the amiodarone. Holding parameters will be utilized.  Blood pressure will be monitored closely.   #8 recent small bowel obstruction status post surgical intervention: She does have minimal abdominal discomfort, but abdomen is benign. We will proceed with the 2 view abdominal x-rays which showed mild ileus.   #9 anemia: Hemoglobin is close to baseline.   #10. Chronic lower extremity wounds: Daughter tell me that the wraps were changed just 3 days ago. She tells me that the wounds have healed up. Continue current management.   Have advised the patient's daughter, that patient seems to be declining. Requested PT eval and palliative care consult. Palliative care consulted for goals of care, plan is to see how she does over the weekend and meet again tomorrow and reassess. On further discussions with the patient, she reports, she is in too much pain for any further testing and just wants survive this hospital visit, doesn't want any more testing at this time, and wants to go home.   Elevated liver function tests: unclear etiology, [place] abd does not show any liver lesions, repeat levels show improvement, possibly from passive congestion.    Code Status: limited Family Communication: none at bedside. Spoke to the son yesterday and today.  Disposition Plan: pending tomrrow meeting with the family.     Consultants:  Palliative care   Procedures:  none  Antibiotics:  IV cefepime  IV vancomycin.   HPI/Subjective: Much pain today. Increased the frequency of her oxy. Added roxanol. She is more comfortable today. And is moved to [facility]. Objective: Filed Vitals:   10/04/13 1733  BP: 118/74  Pulse: 70  Temp: 98.3 F (36.8 C)  Resp: 16    Intake/Output Summary (Last 24 hours) at 10/04/13 1828  Last data filed at 10/04/13 1618  Gross per 24 hour  Intake    493 ml  Output   1125 ml  Net   -632 ml   Filed Weights   10/03/13 0728 10/04/13 0511 10/04/13 1733  Weight: 44.5 kg (98 lb 1.7 oz) 42.185 kg (93 lb) 42.185 kg (93 lb)     Exam:   General:  Alert afebrile comfortable  Cardiovascular: s1s2  Respiratory: RHONCHI BILATERAL improved.   Abdomen: soft NT ND BS+  Musculoskeletal: no pedal edema.   Data Reviewed: Basic Metabolic Panel:  Recent Labs Lab 09/28/13 1315 09/29/13 0441 09/30/13 1342 10/02/13 0500 10/04/13 0410  NA 123* 128* 128* 133* 131*  K 4.7 4.0 3.7 4.4 4.0  CL 91* 98 99 106 101  CO2 19 18* 18* 19 23  GLUCOSE 101* 72 124* 116* 103*  BUN 32* 27* 23 20 16   CREATININE 0.70 0.64 0.53 0.48* 0.46*  CALCIUM 8.2* 7.8* 7.9* 8.5 8.1*   Liver Function Tests:  Recent Labs Lab 09/29/13 0441 09/30/13 1342 10/02/13 0500 10/04/13 0410  AST 716* 264* 81* 38*  ALT 635* 569* 319* 174*  ALKPHOS 76 96 116 123*  BILITOT 1.6* 1.4* 1.4* 0.8  PROT 4.9* 5.2* 5.0* 4.8*  ALBUMIN 2.1* 2.1* 2.0* 1.9*   No results found for this basename: LIPASE, AMYLASE,  in the last 168 hours No results found for this basename: AMMONIA,  in the last 168 hours CBC:  Recent Labs Lab 09/28/13 1315 09/29/13 0441 10/02/13 0500 10/04/13 0410  WBC 12.6* 10.9* 11.4* 11.9*  NEUTROABS 11.1*  --   --   --   HGB 10.1* 9.4* 10.1* 9.7*  HCT 30.3* 28.7* 29.5* 29.2*  MCV 83.9 85.4 83.1 83.9  PLT 258 232 232 262   Cardiac Enzymes: No results found for this basename: CKTOTAL, CKMB, CKMBINDEX, TROPONINI,  in the last 168 hours BNP (last 3 results)  Recent Labs  07/28/13 0415 09/28/13 1426 09/30/13 1342  PROBNP 1586.0* 27885.0* 7860.0*   CBG: No results found for this basename: GLUCAP,  in the last 168 hours  Recent Results (from the past 240 hour(s))  CULTURE, BLOOD (ROUTINE X 2)     Status: None   Collection Time    09/28/13  5:31 PM      Result Value Range Status   Specimen Description BLOOD RIGHT HAND   Final   Special Requests BOTTLES DRAWN AEROBIC AND ANAEROBIC 10CC   Final   Culture  Setup Time     Final   Value: 09/29/2013 01:57     Performed at [facility]   Culture     Final    Value: STAPHYLOCOCCUS AUREUS     Note: RIFAMPIN AND GENTAMICIN SHOULD NOT BE USED AS SINGLE DRUGS FOR TREATMENT OF STAPH INFECTIONS.     Note: Gram Stain Report Called to,Read Back By and Verified With: J CRUTCHFIELD ON 10/01/2013 AT 12:19A BY WILEJ     Performed at [facility]   Report Status 10/03/2013 FINAL   Final   Organism ID, Bacteria STAPHYLOCOCCUS AUREUS   Final  CULTURE, BLOOD (ROUTINE X 2)     Status: None   Collection Time    09/28/13  5:32 PM      Result Value Range Status   Specimen Description BLOOD RIGHT ARM   Final   Special Requests BOTTLES DRAWN AEROBIC AND ANAEROBIC 10CC   Final   Culture  Setup Time     Final  Value: 09/29/2013 02:32     Performed at [facility]   Culture     Final   Value: STAPHYLOCOCCUS AUREUS     Note: SUSCEPTIBILITIES PERFORMED ON PREVIOUS CULTURE WITHIN THE LAST 5 DAYS.     Note: Gram Stain Report Called to,Read Back By and Verified With: J CRUTCHFIELD ON 10/01/2013 AT 12:19A BY Serafina Mitchell     Performed at [facility]   Report Status 10/03/2013 FINAL   Final     Studies: No results found.  Scheduled Meds: . albuterol  2.5 mg Nebulization TID  . amiodarone  200 mg Oral BID  . aspirin  325 mg Oral Daily  . budesonide  6 mg Oral Q lunch  . ceFEPime (MAXIPIME) IV  1 g Intravenous Q24H  . famotidine  20 mg Oral BID  . feeding supplement  1 Container Oral TID BM  . furosemide  20 mg Oral Daily  . heparin  5,000 Units Subcutaneous Q8H  . irbesartan  75 mg Oral Daily  . mometasone-formoterol  2 puff Inhalation BID  . sodium chloride  3 mL Intravenous Q12H  . vancomycin  1,000 mg Intravenous Q24H   Continuous Infusions:    Principal Problem:   HCAP (healthcare-associated pneumonia) Active Problems:   CAD (coronary artery disease)   Anemia   Aortic stenosis, severe   Ventricular tachycardia   Atrial fibrillation   Hyponatremia   FTT (failure to thrive) in adult   Dehydration   Acute encephalopathy    Time  spent: 25 minutes.     [facility]  [facility] Pager [phone] If 7PM-7AM, please contact night-coverage at [email or web address], password [facility] 10/04/2013, 6:28 PM  LOS: 6 days

## 2013-10-04 NOTE — Progress Notes (Signed)
NUTRITION FOLLOW-UP  DOCUMENTATION CODES Per approved criteria  -Severe malnutrition in the context of chronic illness -Underweight   INTERVENTION: Recommend more aggressive bowel regimen - pt has not had a BM since admission (x 6 days.) Monitor magnesium, potassium, and phosphorus daily for at least 3 days, MD to replete as needed, as pt is at risk for refeeding syndrome given severe malnutrition. Add Ensure Pudding po TID, each supplement provides 170 kcal and 4 grams of protein.  RD to continue to follow nutrition care plan.  NUTRITION DIAGNOSIS: Malnutrition related to failure to thrive as evidenced by poor oral intake and severe muscle/fat mass loss. Ongoing.  Goal: Intake to meet >90% of estimated nutrition needs. Unmet.  Monitor:  weight trends, lab trends, I/O's, PO intake, supplement tolerance  ASSESSMENT: PMHx significant for afib, CAD, severe aortic stenosis. Recent hospitalization from July-August of this year for small bowel obstruction, required TPN. Was discharged to rehab center and recently went home approximately 1 week ago. Admitted with SOB x 3-4 days, sustained 2 falls within the past week and endorses poor appetite with 5 lb wt loss. Work-up reveals HCAP.  Per MD, pt with failure to thrive. Underwent BSE by SLP 10/1, pt with suspected esophageal dysphagia; recommendations of Dysphagia 1 diet with thin liquids were made.   Palliative care evaluated pt on 10/2. Family suspects that her fentanyl patch was causing some delirium and decreased appetite. Oral intake remains poor at this time, pt consuming 10% of meals.  Pt is at risk for refeeding syndrome. Current potassium is WNL. No magnesium or phosphorus is available at this time.  Pt meets criteria for severe MALNUTRITION in the context of chronic illness as evidenced by severe muscle and fat mass loss, intake of <75% x at least 1 month.  Height: Ht Readings from Last 1 Encounters:  09/28/13 5\' 2"  (1.575 m)     Weight: Wt Readings from Last 1 Encounters:  10/04/13 93 lb (42.185 kg)  Admit wt 87 lb  BMI:  Body mass index is 17.01 kg/(m^2). Underweight  Estimated Nutritional Needs: Kcal: 1200 - 1400 Protein: 40 - 50 g  Fluid: 1.2 - 1.4 liters  Skin:  Stage I pressure ulcer to sacrum Multiple skin tears  Diet Order: Dysphagia 1; thin liquids  EDUCATION NEEDS: -No education needs identified at this time   Intake/Output Summary (Last 24 hours) at 10/04/13 1405 Last data filed at 10/04/13 1226  Gross per 24 hour  Intake    383 ml  Output   1125 ml  Net   -742 ml    Last BM: PTA  Labs:   Recent Labs Lab 09/30/13 1342 10/02/13 0500 10/04/13 0410  NA 128* 133* 131*  K 3.7 4.4 4.0  CL 99 106 101  CO2 18* 19 23  BUN 23 20 16   CREATININE 0.53 0.48* 0.46*  CALCIUM 7.9* 8.5 8.1*  GLUCOSE 124* 116* 103*    CBG (last 3)  No results found for this basename: GLUCAP,  in the last 72 hours  Scheduled Meds: . albuterol  2.5 mg Nebulization TID  . amiodarone  200 mg Oral BID  . aspirin  325 mg Oral Daily  . budesonide  6 mg Oral Q lunch  . ceFEPime (MAXIPIME) IV  1 g Intravenous Q24H  . famotidine  20 mg Oral BID  . furosemide  20 mg Oral Daily  . heparin  5,000 Units Subcutaneous Q8H  . irbesartan  75 mg Oral Daily  . mometasone-formoterol  2 puff Inhalation BID  . sodium chloride  3 mL Intravenous Q12H  . vancomycin  1,000 mg Intravenous Q24H    Continuous Infusions: none    Jarold Motto MS, RD, LDN Pager: [phone] After-hours pager: [phone]

## 2013-10-04 NOTE — Progress Notes (Signed)
CSW following for discharge planning, patient has bed @ [facility] when medically ready. CSW spoke with Nelson Chimes with [facility], who confirmed that they have no bed availability as of today. has completed FL2 & will continue to follow and assist with discharge when ready.    Unice Bailey, LCSW [facility] Clinical Social Worker cell #: [phone]

## 2013-10-05 NOTE — Progress Notes (Signed)
[facility] PROGRESS NOTE  KAMRY FARACI ZOX:[id number] DOB: 07-17-1923 DOA: 09/28/2013 PCP:  Duane Lope, MD Brief HPI:   This is 79 year old, Caucasian female, who presents with progressively worsening dyspnea. She is very dehydrated, with severe hyponatremia. she has left lower lobe pneumonia. She was started on IV antibiotics and speech and swallow eval requested. Recommended dysphagia 1 diet.   Assessment/Plan: #1 dyspnea with hypoxia and acute respiratory failure with possible healthcare associated pneumonia: Continue oxygen as needed. She was placed on  broad-spectrum antibiotics with vancomycin and cefepime. Swallow evaluation  obtained.  Recommended dysphagia 1 diet.  Nebulizer treatments will be provided. At this time we will hold off on steroids. This can be considered if there is no improvement.  Blood cultures are positive for gram positive cocci in clusters, which were identified as staph aureus, pan sensitive. She completed 7 days of antibiotics. And they were discontinued .   #2 severe dehydration with hyponatremia and failure to thrive: sodium levels have stabilized at 131.  #3 history of coronary artery disease, Chronic systolic CHF and severe aortic stenosis: These issues appear to be stable. EKG does not show any ischemic changes. EF is 40-45%.   #4 history of tachyarrhythmias including V tach, as well as atrial fibrillation: Continue with amiodarone.  She is not on anticoagulation and just aspirin.   #5 acute encephalopathy:  Much improved. This is most likely due to the metabolic derangements as well as infectious process. Fentanyl patch may have contributed and has been discontinued for now.   #6 chronic pain due to chronic back issues and arthritis: Resume pain medication as soon as her mental status is back to baseline.   #7 history of hypertension: Blood pressure is low normal. We will hold off on all of her antihypertensive medications, except for the amiodarone  and lasix.   #8 recent small bowel obstruction status post surgical intervention: She does have minimal abdominal discomfort, but abdomen is benign. We will proceed with the 2 view abdominal x-rays which showed mild ileus.   #9 anemia: Hemoglobin is close to baseline.   #10. Chronic lower extremity wounds: Daughter tell me that the wraps were changed just 3 days ago. She tells me that the wounds have healed up. Continue current management.   #11Elevated liver function tests: unclear etiology, [place] abd does not show any liver lesions, repeat levels show improvement, possibly from passive congestion.    #12 Have advised the patient's daughter, that patient seems to be declining. Requested PT eval and palliative care consult. Palliative care consulted for goals of care, plan is to see how she does over the weekend and meet again tomorrow and reassess. On further discussions with the patient, she reports, she is in too much pain for any further testing and just wants survive this hospital visit, doesn't want any more testing at this time, and wants to go home. We have transitioned her to comfort care.  Code Status: limited Family Communication: none at bedside. Spoke to the son yesterday and today.  Disposition Plan: pending tomrrow meeting with the family.     Consultants:  Palliative care   Procedures:  none  Antibiotics:  IV cefepime 9/30 10/6  IV vancomycin. 9/30 - 10/6  HPI/Subjective: No new complaiints Objective: Filed Vitals:   10/05/13 1320  BP: 96/52  Pulse: 69  Temp: 98.1 F (36.7 C)  Resp: 16    Intake/Output Summary (Last 24 hours) at 10/05/13 1356 Last data filed at 10/05/13 1230  Gross  per 24 hour  Intake    170 ml  Output   1100 ml  Net   -930 ml   Filed Weights   10/03/13 0728 10/04/13 0511 10/04/13 1733  Weight: 44.5 kg (98 lb 1.7 oz) 42.185 kg (93 lb) 42.185 kg (93 lb)    Exam:   General:  Alert afebrile comfortable  Cardiovascular:  s1s2  Respiratory: RHONCHI BILATERAL improved.   Abdomen: soft NT ND BS+  Musculoskeletal: no pedal edema.   Data Reviewed: Basic Metabolic Panel:  Recent Labs Lab 09/29/13 0441 09/30/13 1342 10/02/13 0500 10/04/13 0410  NA 128* 128* 133* 131*  K 4.0 3.7 4.4 4.0  CL 98 99 106 101  CO2 18* 18* 19 23  GLUCOSE 72 124* 116* 103*  BUN 27* 23 20 16   CREATININE 0.64 0.53 0.48* 0.46*  CALCIUM 7.8* 7.9* 8.5 8.1*   Liver Function Tests:  Recent Labs Lab 09/29/13 0441 09/30/13 1342 10/02/13 0500 10/04/13 0410  AST 716* 264* 81* 38*  ALT 635* 569* 319* 174*  ALKPHOS 76 96 116 123*  BILITOT 1.6* 1.4* 1.4* 0.8  PROT 4.9* 5.2* 5.0* 4.8*  ALBUMIN 2.1* 2.1* 2.0* 1.9*   No results found for this basename: LIPASE, AMYLASE,  in the last 168 hours No results found for this basename: AMMONIA,  in the last 168 hours CBC:  Recent Labs Lab 09/29/13 0441 10/02/13 0500 10/04/13 0410  WBC 10.9* 11.4* 11.9*  HGB 9.4* 10.1* 9.7*  HCT 28.7* 29.5* 29.2*  MCV 85.4 83.1 83.9  PLT 232 232 262   Cardiac Enzymes: No results found for this basename: CKTOTAL, CKMB, CKMBINDEX, TROPONINI,  in the last 168 hours BNP (last 3 results)  Recent Labs  07/28/13 0415 09/28/13 1426 09/30/13 1342  PROBNP 1586.0* 27885.0* 7860.0*   CBG: No results found for this basename: GLUCAP,  in the last 168 hours  Recent Results (from the past 240 hour(s))  CULTURE, BLOOD (ROUTINE X 2)     Status: None   Collection Time    09/28/13  5:31 PM      Result Value Range Status   Specimen Description BLOOD RIGHT HAND   Final   Special Requests BOTTLES DRAWN AEROBIC AND ANAEROBIC 10CC   Final   Culture  Setup Time     Final   Value: 09/29/2013 01:57     Performed at [facility]   Culture     Final   Value: STAPHYLOCOCCUS AUREUS     Note: RIFAMPIN AND GENTAMICIN SHOULD NOT BE USED AS SINGLE DRUGS FOR TREATMENT OF STAPH INFECTIONS.     Note: Gram Stain Report Called to,Read Back By and Verified  With: J CRUTCHFIELD ON 10/01/2013 AT 12:19A BY WILEJ     Performed at [facility]   Report Status 10/03/2013 FINAL   Final   Organism ID, Bacteria STAPHYLOCOCCUS AUREUS   Final  CULTURE, BLOOD (ROUTINE X 2)     Status: None   Collection Time    09/28/13  5:32 PM      Result Value Range Status   Specimen Description BLOOD RIGHT ARM   Final   Special Requests BOTTLES DRAWN AEROBIC AND ANAEROBIC 10CC   Final   Culture  Setup Time     Final   Value: 09/29/2013 02:32     Performed at [facility]   Culture     Final   Value: STAPHYLOCOCCUS AUREUS     Note: SUSCEPTIBILITIES PERFORMED ON PREVIOUS CULTURE WITHIN THE  LAST 5 DAYS.     Note: Gram Stain Report Called to,Read Back By and Verified With: J CRUTCHFIELD ON 10/01/2013 AT 12:19A BY Serafina Mitchell     Performed at [facility]   Report Status 10/03/2013 FINAL   Final     Studies: No results found.  Scheduled Meds: . albuterol  2.5 mg Nebulization TID  . amiodarone  200 mg Oral BID  . aspirin  325 mg Oral Daily  . budesonide  6 mg Oral Q lunch  . famotidine  20 mg Oral BID  . feeding supplement (ENSURE)  1 Container Oral TID BM  . furosemide  20 mg Oral Daily  . heparin  5,000 Units Subcutaneous Q8H  . irbesartan  75 mg Oral Daily  . lidocaine  1 patch Transdermal Q24H  . mometasone-formoterol  2 puff Inhalation BID  . morphine CONCENTRATE  5 mg Oral Q4H  . sodium chloride  3 mL Intravenous Q12H   Continuous Infusions:    Principal Problem:   HCAP (healthcare-associated pneumonia) Active Problems:   CAD (coronary artery disease)   Anemia   Aortic stenosis, severe   Ventricular tachycardia   Atrial fibrillation   Hyponatremia   FTT (failure to thrive) in adult   Dehydration   Acute encephalopathy    Time spent: 25 minutes.     [facility]  [facility] Pager [phone] If 7PM-7AM, please contact night-coverage at [email or web address], password [facility] 10/05/2013, 1:56 PM  LOS: 7 days

## 2013-10-05 NOTE — Progress Notes (Signed)
Patient WU:[id number] H Nave      DOB: 11-Nov-1923      YNW:[id number]   Palliative Medicine Team at [facility] Progress Note    Subjective: Patient awake alert . Patient states that she slept so much better.  Nursing supported that finding.  Patient able to eat a little bit but not enough to sustain herself.    Filed Vitals:   10/05/13 1320  BP: 96/52  Pulse: 69  Temp: 98.1 F (36.7 C)  Resp: 16   Physical exam:  General: cachectic Perrl, Eomi, anicteric mm dry Chest: decreased not as rhonchorous CVS: regular S1,S2 Abd firm but not tender, slow bowel sounds Ext.cachectic Neuro: oriented to self, slightly confused     Assessment and plan: 79 yr old white female admitted with pneumonia, possibly related to aspiration pneumonia.Course complicated by recurrent staph bacteremia, severe failure to thrive, severe malnutrition and back pain concerning for possible vertebral involvement.  Family has elected comfort care in lieu of extensive antibiotic treatments.  She herself has expessed that all she wants is to be out of pain and enjoy her family.   1.  DNR  2.  Pain: currently on scheduled Roxanol with break thorough q 2 hrs prn, lidocaine patch    3. Comfort feeding:   Total time 15 min.  No family at the bedside   Emberlie Gotcher L. Ladona Ridgel, MD MBA The Palliative Medicine Team at [facility] Phone: [phone] Pager: [phone]

## 2013-10-05 NOTE — Progress Notes (Signed)
CSW following for residential hospice placement.  CSW spoke with [facility] who stated that hospice home did not have bed available today, but pt was the next on the list to transfer to facility once bed available. Per [facility] liaison, hospice home will notify this CSW tomorrow AM about bed availability and have scheduled meeting to complete paperwork with pt daughter tomorrow.   CSW updated MD.  CSW to continue to follow for pt disposition needs.   Jacklynn Lewis, MSW, LCSWA  Clinical Social Work [phone]

## 2013-10-06 DIAGNOSIS — R636 Underweight: Secondary | ICD-10-CM | POA: Diagnosis present

## 2013-10-06 DIAGNOSIS — I5022 Chronic systolic (congestive) heart failure: Secondary | ICD-10-CM | POA: Diagnosis present

## 2013-10-06 DIAGNOSIS — E43 Unspecified severe protein-calorie malnutrition: Secondary | ICD-10-CM | POA: Diagnosis present

## 2013-10-06 MED ORDER — ALBUTEROL SULFATE (5 MG/ML) 0.5% IN NEBU: 2.5000 mg | INHALATION_SOLUTION | RESPIRATORY_TRACT | Status: AC | PRN

## 2013-10-06 MED ORDER — MORPHINE SULFATE (CONCENTRATE) 10 MG /0.5 ML PO SOLN: 5.0000 mg | ORAL | Status: AC | PRN

## 2013-10-06 MED ORDER — MORPHINE SULFATE (CONCENTRATE) 10 MG /0.5 ML PO SOLN: 5.0000 mg | ORAL | Status: AC

## 2013-10-06 MED ORDER — ALBUTEROL SULFATE (5 MG/ML) 0.5% IN NEBU: 2.5000 mg | INHALATION_SOLUTION | Freq: Three times a day (TID) | RESPIRATORY_TRACT | Status: AC

## 2013-10-06 MED ORDER — ENSURE PUDDING PO PUDG: 1.0000 | Freq: Three times a day (TID) | ORAL | Status: AC

## 2013-10-06 NOTE — Discharge Summary (Signed)
Physician Discharge Summary  Ariel Hunt ZOX:[id number] DOB: 10-25-1923 DOA: 09/28/2013  PCP:  Duane Lope, MD  Admit date: 09/28/2013 Discharge date: 10/06/2013  Recommendations for Outpatient Follow-up:  1. The patient is being discharged to a [facility].  Discharge Diagnoses:  Principal Problem:    Sepsis secondary to staph aureus bacteremia and HCAP (healthcare-associated pneumonia) with acute respiratory failure Active Problems:    CAD (coronary artery disease)    Chronic normocytic anemia    Aortic stenosis, severe    Ventricular tachycardia    Atrial fibrillation    Hyponatremia    FTT (failure to thrive) in adult    Dehydration    Acute encephalopathy    Chronic systolic congestive heart failure    Severe malnutrition in the context of chronic illness    Underweight   Discharge Condition: Terminal.  Diet recommendation: Regular, comfort feeds as tolerated. Dysphagia 1/pured.  History of present illness:  Ariel Hunt is an 79 y.o. female with a PMH of atrial fibrillation, coronary artery disease and severe aortic stenosis, recently hospitalized with small bowel obstruction necessitating TPN complicated by MSSA bacteremia related to PICC line infection, status post prolonged antibiotics with Ancef, and further complicated by volume overload and tachycardia arrhythmias, ultimately discharged to a rehabilitation facility on 08/02/2013, who was readmitted on 09/28/2013 with left lower lobe pneumonia, severe hyponatremia and dehydration. Her hospital course this time has been complicated by delirium, failure to thrive, and elevated liver enzymes. She was seen by the palliative care team on 09/30/2013 for goals of care, and made a DO NOT RESUSCITATE with focus on comfort. Plan is to discharge to a residential hospice facility when a bed is available.  Hospital Course by problem:  Principal Problem:  Sepsis secondary to staph aureus bacteremia and HCAP  (healthcare-associated pneumonia) with acute respiratory failure  -Patient was admitted and put on broad-spectrum antibiotics with vancomycin and cefepime, and completed 7 days of therapy.  -Blood cultures on 09/28/2013 positive for Staphylococcus aureus, pansensitive, status post 7 days of vancomycin which was discontinued on 10/04/2013.  -Evaluated by speech therapy with recommendations for dysphasia 1 (pureed) diet.  -Continue nebulized bronchodilator therapy and oxygen support.  Active Problems:  Severe malnutrition in the context of chronic illness/underweight  -Patient was seen and evaluated by the dietitian. She has poor oral intake and severe muscle/fat mass loss. Continue to support with dietary supplements.  Transaminitis  -Unclear etiology. Status post abdominal ultrasound on 09/29/2013 which showed nonspecific mild slightly coarsened echogenicity of the hepatic parenchyma. No discrete hepatic lesions. Possibly from chronic systolic heart failure and passive congestion of the liver.  -Liver enzymes have trended down.  CAD (coronary artery disease)  -Continue aspirin therapy. Beta blocker currently on hold secondary to soft blood pressure.  -12-lead EKG done on admission negative for ischemic changes.  Chronic normocytic anemia  -Hemoglobin stable with no current indications for transfusion.  Aortic stenosis, severe / chronic systolic congestive heart failure  -Pro BNP chronically elevated. Watch I and O. balance closely, currently compensated.  History of tacharrhythmias including Ventricular tachycardia and Atrial fibrillation  -Not currently on telemetry, but admission EKG showed that she was in normal sinus rhythm.  -Continue Pacerone.  -Continue aspirin. Not a candidate for chronic anticoagulation.  Hyponatremia  -Admission sodium 123. Improved to 131 with treatment of underlying sepsis/pneumonia and rehydration.  FTT (failure to thrive) in adult  -Seen by the palliative care  team with goals of care to focus on comfort.  Dehydration  -Hydrated.  Acute encephalopathy  -Fentanyl patch discontinued in case contributory to mental status changes.  Recent small bowel obstruction status post surgical intervention  -Status post 2 views of the abdomen which showed a mild ileus.  Procedures:  None.  Medical Consultants:  Dr. Derenda Mis, Palliative care   Discharge Exam: Filed Vitals:   10/06/13 1015  BP: 129/63  Pulse: 61  Temp:   Resp:    Filed Vitals:   10/05/13 2143 10/06/13 0559 10/06/13 0751 10/06/13 1015  BP: 110/58 95/75  129/63  Pulse: 64 65  61  Temp: 97.9 F (36.6 C) 97 F (36.1 C)    TempSrc: Oral Oral    Resp: 18 19    Height:      Weight:      SpO2: 99% 96% 97%     Gen: NAD  Cardiovascular: RRR, 2/6 systolic ejection murmur  Respiratory: Lungs with faint rales/rhonchi throughout  Gastrointestinal: Abdomen soft, NT/ND, + BS  Extremities: Trace edema with scattered ecchymoses  Discharge Instructions  Discharge Orders   Future Orders Complete By Expires   Call MD for:  persistant nausea and vomiting  As directed    Call MD for:  severe uncontrolled pain  As directed    Diet general  As directed    Increase activity slowly  As directed        Medication List    STOP taking these medications       fentaNYL 12 MCG/HR  Commonly known as:  DURAGESIC - dosed mcg/hr     ferrous sulfate 325 (65 FE) MG tablet     HYDROcodone-acetaminophen 5-325 MG per tablet  Commonly known as:  NORCO/VICODIN     multivitamin with minerals Tabs tablet     traMADol 50 MG tablet  Commonly known as:  ULTRAM      TAKE these medications       ADVAIR DISKUS 100-50 MCG/DOSE Aepb  Generic drug:  Fluticasone-Salmeterol  Inhale 1 puff into the lungs every 12 (twelve) hours.     albuterol (5 MG/ML) 0.5% nebulizer solution  Commonly known as:  PROVENTIL  Take 0.5 mLs (2.5 mg total) by nebulization every 2 (two) hours as needed for wheezing.      albuterol (5 MG/ML) 0.5% nebulizer solution  Commonly known as:  PROVENTIL  Take 0.5 mLs (2.5 mg total) by nebulization 3 (three) times daily.     amiodarone 200 MG tablet  Commonly known as:  PACERONE  Take 1 tablet (200 mg total) by mouth 2 (two) times daily.     aspirin 325 MG tablet  Take 1 tablet (325 mg total) by mouth daily.     budesonide 3 MG 24 hr capsule  Commonly known as:  ENTOCORT EC  Take 6 mg by mouth daily with lunch.     famotidine 20 MG tablet  Commonly known as:  PEPCID  Take 1 tablet (20 mg total) by mouth 2 (two) times daily.     feeding supplement (ENSURE) Pudg  Take 1 Container by mouth 3 (three) times daily between meals.     furosemide 20 MG tablet  Commonly known as:  LASIX  Take 20 mg by mouth every morning.     lidocaine 5 %  Commonly known as:  LIDODERM  Place 1 patch onto the skin daily. Remove & Discard patch within 12 hours or as directed by MD     metoprolol tartrate 25 MG tablet  Commonly known as:  LOPRESSOR  Take 12.5 mg by mouth 2 (two) times daily.     morphine CONCENTRATE 10 mg / 0.5 ml concentrated solution  Take 0.25 mLs (5 mg total) by mouth every 4 (four) hours.     morphine CONCENTRATE 10 mg / 0.5 ml concentrated solution  Take 0.25 mLs (5 mg total) by mouth every 2 (two) hours as needed.     potassium chloride SA 20 MEQ tablet  Commonly known as:  K-DUR,KLOR-CON  Take 1 tablet (20 mEq total) by mouth 2 (two) times daily.     promethazine 25 MG tablet  Commonly known as:  PHENERGAN  Take 25 mg by mouth every 6 (six) hours as needed for nausea.     triamcinolone cream 0.1 %  Commonly known as:  KENALOG  Apply 1 application topically as needed (for itching (no more than once weekly)).     valsartan 160 MG tablet  Commonly known as:  DIOVAN  Take 80 mg by mouth 2 (two) times daily.           Follow-up Information   Follow up with  Duane Lope, MD. (As needed)    Specialty:  Family Medicine   Contact  information:   [address] [place] [address] [phone]        The results of significant diagnostics from this hospitalization (including imaging, microbiology, ancillary and laboratory) are listed below for reference.    Significant Diagnostic Studies: Dg Chest 2 View  09/28/2013   CLINICAL DATA:  Cough, COPD  EXAM: CHEST  2 VIEW  COMPARISON:  07/27/2013  FINDINGS: Chronic position markings carcinoma changes. Mild left basilar opacity, likely a combination of atelectasis and small left pleural effusion. No pneumothorax.  The heart is normal in size.  Discussion thoracolumbar scoliosis.  IMPRESSION: Mild left basilar opacity, likely a combination of atelectasis and small left pleural effusion.   Electronically Signed   By: Charline Bills M.D.   On: 09/28/2013 13:16   Dg Lumbar Spine Complete  09/26/2013   *RADIOLOGY REPORT*  Clinical Data: Back pain  LUMBAR SPINE - COMPLETE 4+ VIEW  Comparison: Prior pelvic radiographs 09/24/2013; CT abdomen/pelvis 07/17/2013  Findings: Frontal, lateral and bilateral oblique radiographs of the lumbosacral spine demonstrate demonstrates multiple chronic- appearing compression fractures including T8, T9, T11, T12 and L3. Additionally, there is levoconvex scoliosis of the lumbar spine centered at L2 with lateral translation of L3-L4 and significant tilt of L4 on L5 with marked widening of the right disc space. Associated multilevel degenerative disc disease and facet arthropathy.  The bones are osteopenic.  Atherosclerotic calcifications noted throughout the aorto iliac system.  The visualized bony pelvis appears intact.  IMPRESSION:  1.  No definite acute fracture or malalignment. 2.  Numerous multilevel thoracic and lumbar compression fractures without significant interval change compared to prior imaging. 3.  Advanced multilevel degenerative disc disease and lower lumbar facet arthropathy with advanced levoconvex scoliosis centered at L2. 4.  Aorto  iliac atherosclerotic vascular calcifications.   Original Report Authenticated By: Malachy Moan, M.D.   Dg Pelvis 1-2 Views  09/24/2013   CLINICAL DATA:  Fall. Chronic hip pain.  EXAM: PELVIS - 1-2 VIEW  COMPARISON:  Multiple exams, including 07/26/2013 and 07/17/2013  FINDINGS: Prominent levoconvex lumbar scoliosis and spondylosis. Atherosclerosis noted.  Bony demineralization is present. Mild prominence of stool in the colon.  No discrete pelvic fracture is identified.  IMPRESSION: 1. Bony demineralization is present. No fracture identified. 2. Prominent lumbar scoliosis and spondylosis. 3.  Atherosclerosis.   Electronically Signed   By: Herbie Baltimore   On: 09/24/2013 10:24   US Abdomen Complete  09/29/2013   *RADIOLOGY REPORT*  Clinical Data:  Elevated LFTs, initial encounter.  COMPLETE ABDOMINAL ULTRASOUND  Comparison:  CT abdomen pelvis - 07/07/2013  Findings:  Gallbladder:  There is layering echogenic debris compatible with sludge within an otherwise normal-appearing gallbladder.  No gallbladder wall thickening or pericholecystic fluid.  Negative sonographic Murphy's sign.  Common bile duct:  Normal in size measuring 5.4 mm in diameter  Liver:  There is mild diffuse slightly coarsened echogenicity of the hepatic parenchyma.  No discrete hepatic lesions.  No definite intrahepatic biliary ductal dilatation.  There is a trace amount of ascites adjacent to the liver edge.  IVC:  Appears normal.  Pancreas:  Limited visualization of the pancreatic head and neck is normal.  Visualization of the pancreatic body and tail is obscured by bowel gas.  Spleen:  Normal in size measuring 5.1 cm in length.  There are multiple punctate echogenic foci within the spleen compatible with granulomas.  There is a trace amount of perisplenic fluid.  Right Kidney:  Normal cortical thickness, echogenicity and size, measuring 8.7 cm in length.  No focal renal lesions.  No echogenic renal stones.  Note is made of a extrarenal  pelvis, similar to prior abdominal CT. No urinary obstruction.  Left Kidney:  Normal cortical thickness, echogenicity and size, measuring 10.0 cm in length.  No focal renal lesions.  No echogenic renal stones.  No urinary obstruction.  Abdominal aorta:  No aneurysm identified.  Incidental note is made of small bilateral pleural effusions.  IMPRESSION:  1.  Nonspecific mild slightly coarsened echogenicity of the hepatic parenchyma.  No discrete hepatic lesions. 2.  Layering echogenic sludge with an otherwise normal-appearing gallbladder.  3.  Small bilateral effusions with trace amount of perihepatic and perisplenic fluid, nonspecific but could be the sequela of volume overload/congestive heart failure.  Clinical correlation is advised.   Original Report Authenticated By: Tacey Ruiz, MD   Dg Chest Port 1 View  10/01/2013   CLINICAL DATA:  Pneumonia  EXAM: PORTABLE CHEST - 1 VIEW  COMPARISON:  09/28/2010  FINDINGS: Persistent left retrocardiac density is noted with associated small effusion. The lungs are otherwise clear. Degenerative changes are noted in the thoracic spine with multiple compression deformities. No acute bony abnormality is seen.  IMPRESSION: Stable appearance of left basilar infiltrate.   Electronically Signed   By: Alcide Clever M.D.   On: 10/01/2013 18:44   Dg Knee Complete 4 Views Left  09/28/2013   CLINICAL DATA:  Fall, left knee abrasion  EXAM: LEFT KNEE - COMPLETE 4+ VIEW  COMPARISON:  None.  FINDINGS: No fracture or dislocation is seen.  Joint spaces are essentially preserved.  No suprapatellar knee joint effusion.  Vascular calcifications.  IMPRESSION: No fracture or dislocation is seen.   Electronically Signed   By: Charline Bills M.D.   On: 09/28/2013 13:18   Dg Abd 2 Views  09/30/2013   CLINICAL DATA:  Abdominal pain. Small bowel obstructions.  EXAM: ABDOMEN - 2 VIEW  COMPARISON:  09/29/2013  FINDINGS: Multiple dilated bowel loops again seen, which show no significant change.  Some of this represents colonic dilatation, however dilated small bowel loops cannot be excluded. There is no evidence of free air.  Severe lumbar spine degenerative changes and rotatory scoliosis again demonstrated.  IMPRESSION: No significant change in dilated bowel loops, possibly due to  colonic ileus although bowel obstruction cannot definitely be excluded. Continued radiographic followup recommended.   Electronically Signed   By: Myles Rosenthal   On: 09/30/2013 16:05   Dg Abd 2 Views  09/29/2013   CLINICAL DATA:  Abdominal pain  EXAM: ABDOMEN - 2 VIEW  COMPARISON:  09/28/2013  FINDINGS: Distended large and small bowel loops unchanged from the prior study and most consistent with adynamic ileus. No free air.  IMPRESSION: Moderate ileus is unchanged from the prior study.   Electronically Signed   By: Marlan Palau M.D.   On: 09/29/2013 17:14   Dg Abd 2 Views  09/28/2013   CLINICAL DATA:  Abdominal pain.  Abdominal distention.  EXAM: ABDOMEN - 2 VIEW  COMPARISON:  07/26/2013  FINDINGS: There is mild bowel distention. Loops of mildly dilated small bowel on most evident in the left lower quadrant. Mildly dilated colon is seen in the right lower quadrant and left lower quadrant and pelvis.  A bowel anastomosis staple line is noted in the right pelvis. Calcifications noted along a tortuous and ectatic abdominal aorta and into the iliac arteries. The soft tissues are otherwise unremarkable.  There is no free air on the decubitus view. There are a few air-fluid levels in the mildly dilated bowel.  Extensive degenerative changes and scoliosis are noted throughout the visualized spine, stable. The bones are extensively demineralized.  IMPRESSION: Mild dilation of small bowel as well as colon. There are few air-fluid levels. This suggests a mild adynamic ileus. No convincing obstruction. No free air.   Electronically Signed   By: Amie Portland   On: 09/28/2013 19:13    Labs:  Basic Metabolic Panel:  Recent  Labs Lab 09/30/13 1342 10/02/13 0500 10/04/13 0410  NA 128* 133* 131*  K 3.7 4.4 4.0  CL 99 106 101  CO2 18* 19 23  GLUCOSE 124* 116* 103*  BUN 23 20 16   CREATININE 0.53 0.48* 0.46*  CALCIUM 7.9* 8.5 8.1*   GFR Estimated Creatinine Clearance: 33 ml/min (by C-G formula based on Cr of 0.46). Liver Function Tests:  Recent Labs Lab 09/30/13 1342 10/02/13 0500 10/04/13 0410  AST 264* 81* 38*  ALT 569* 319* 174*  ALKPHOS 96 116 123*  BILITOT 1.4* 1.4* 0.8  PROT 5.2* 5.0* 4.8*  ALBUMIN 2.1* 2.0* 1.9*   CBC:  Recent Labs Lab 10/02/13 0500 10/04/13 0410  WBC 11.4* 11.9*  HGB 10.1* 9.7*  HCT 29.5* 29.2*  MCV 83.1 83.9  PLT 232 262   Microbiology Recent Results (from the past 240 hour(s))  CULTURE, BLOOD (ROUTINE X 2)     Status: None   Collection Time    09/28/13  5:31 PM      Result Value Range Status   Specimen Description BLOOD RIGHT HAND   Final   Special Requests BOTTLES DRAWN AEROBIC AND ANAEROBIC 10CC   Final   Culture  Setup Time     Final   Value: 09/29/2013 01:57     Performed at [facility]   Culture     Final   Value: STAPHYLOCOCCUS AUREUS     Note: RIFAMPIN AND GENTAMICIN SHOULD NOT BE USED AS SINGLE DRUGS FOR TREATMENT OF STAPH INFECTIONS.     Note: Gram Stain Report Called to,Read Back By and Verified With: J CRUTCHFIELD ON 10/01/2013 AT 12:19A BY Serafina Mitchell     Performed at [facility]   Report Status 10/03/2013 FINAL   Final   Organism ID, Bacteria STAPHYLOCOCCUS AUREUS  Final  CULTURE, BLOOD (ROUTINE X 2)     Status: None   Collection Time    09/28/13  5:32 PM      Result Value Range Status   Specimen Description BLOOD RIGHT ARM   Final   Special Requests BOTTLES DRAWN AEROBIC AND ANAEROBIC 10CC   Final   Culture  Setup Time     Final   Value: 09/29/2013 02:32     Performed at [facility]   Culture     Final   Value: STAPHYLOCOCCUS AUREUS     Note: SUSCEPTIBILITIES PERFORMED ON PREVIOUS CULTURE WITHIN THE LAST 5  DAYS.     Note: Gram Stain Report Called to,Read Back By and Verified With: J CRUTCHFIELD ON 10/01/2013 AT 12:19A BY WILEJ     Performed at [facility]   Report Status 10/03/2013 FINAL   Final    Time coordinating discharge: 35 minutes.  Signed:  RAMA,CHRISTINA  Pager [phone] [facility] 10/06/2013, 2:17 PM

## 2013-10-06 NOTE — Progress Notes (Signed)
Report given to Manus Rudd RN,nurse at [facility] [place].- Hulda Marin RN

## 2013-10-06 NOTE — Progress Notes (Addendum)
[facility] PROGRESS NOTE  Ariel Hunt ZOX:[id number] DOB: 05-15-1923 DOA: 09/28/2013 PCP:  Duane Lope, MD  Brief narrative: Ariel Hunt is an 79 y.o. female with a PMH of atrial fibrillation, coronary artery disease and severe aortic stenosis, recently hospitalized with small bowel obstruction necessitating TPN complicated by MSSA bacteremia related to PICC line infection, status post prolonged antibiotics with Ancef, and further complicated by volume overload and tachycardia arrhythmias, ultimately discharged to a rehabilitation facility on 08/02/2013, who was readmitted on 09/28/2013 with left lower lobe pneumonia, severe hyponatremia and dehydration. Her hospital course this time has been complicated by delirium, failure to thrive, and elevated liver enzymes. She was seen by the palliative care team on 09/30/2013 for goals of care, and made a DO NOT RESUSCITATE with focus on comfort. Plan is to discharge to a residential hospice facility when a bed is available.  Assessment/Plan: Principal Problem:   Sepsis secondary to staph aureus bacteremia and HCAP (healthcare-associated pneumonia) with acute respiratory failure -Patient was admitted and put on broad-spectrum antibiotics with vancomycin and cefepime, and completed 7 days of therapy. -Blood cultures on 09/28/2013 positive for Staphylococcus aureus, pansensitive, status post 7 days of vancomycin which was discontinued on 10/04/2013. -Evaluated by speech therapy with recommendations for dysphasia 1 (pureed) diet. -Continue nebulized bronchodilator therapy and oxygen support. Active Problems:   Severe malnutrition in the context of chronic illness/underweight -Patient was seen and evaluated by the dietitian. She has poor oral intake and severe muscle/fat mass loss. Continue to support with dietary supplements.   Transaminitis -Unclear etiology. Status post abdominal ultrasound on 09/29/2013 which showed nonspecific mild slightly coarsened  echogenicity of the hepatic parenchyma.  No discrete hepatic lesions. Possibly from chronic systolic heart failure and passive congestion of the liver. -Liver enzymes have trended down.   CAD (coronary artery disease) -Continue aspirin therapy. Beta blocker currently on hold secondary to soft blood pressure. -12-lead EKG done on admission negative for ischemic changes.   Chronic normocytic anemia -Hemoglobin stable with no current indications for transfusion.   Aortic stenosis, severe / chronic systolic congestive heart failure -Pro BNP chronically elevated. Watch I and O. balance closely.   History of tacharrhythmias including Ventricular tachycardia and Atrial fibrillation -Not currently on telemetry, but admission EKG showed that she was in normal sinus rhythm. -Continue Pacerone. -Continue aspirin. Not a candidate for chronic anticoagulation.   Hyponatremia -Admission sodium 123. Improved to 131 with treatment of underlying sepsis/pneumonia end rehydration.   FTT (failure to thrive) in adult -Seen by the palliative care team with goals of care to focus on comfort.   Dehydration -Hydrated.   Acute encephalopathy -Fentanyl patch discontinued in case contributory to mental status changes.   Recent small bowel obstruction status post surgical intervention -Status post 2 views of the abdomen which showed a mild ileus.  Code Status: DNR Family Communication: No family currently at the bedside. Disposition Plan: Residential hospice placement when bed available.   Medical Consultants:  Dr. Derenda Mis, Palliative care  Other Consultants:  Dietitian  Speech therapy  Anti-infectives:  Cefepime 09/28/2013---> 10/04/2013  Vancomycin 09/28/2013---> 10/04/2013  HPI/Subjective: Ariel Hunt is lethargic and does not awaken to gentle stimulation.  Objective: Filed Vitals:   10/05/13 1320 10/05/13 1920 10/05/13 2143 10/06/13 0559  BP: 96/52  110/58 95/75  Pulse: 69  64 65   Temp: 98.1 F (36.7 C)  97.9 F (36.6 C) 97 F (36.1 C)  TempSrc: Oral  Oral Oral  Resp: 16  18 19  Height:      Weight:      SpO2: 96% 98% 99% 96%    Intake/Output Summary (Last 24 hours) at 10/06/13 0716 Last data filed at 10/05/13 2012  Gross per 24 hour  Intake    180 ml  Output    900 ml  Net   -720 ml    Exam: Gen:  NAD Cardiovascular:  RRR,  2/6 systolic ejection murmur Respiratory:  Lungs with faint rales/rhonchi throughout Gastrointestinal:  Abdomen soft, NT/ND, + BS Extremities:  Trace edema with scattered ecchymoses  Data Reviewed: Basic Metabolic Panel:  Recent Labs Lab 09/30/13 1342 10/02/13 0500 10/04/13 0410  NA 128* 133* 131*  K 3.7 4.4 4.0  CL 99 106 101  CO2 18* 19 23  GLUCOSE 124* 116* 103*  BUN 23 20 16   CREATININE 0.53 0.48* 0.46*  CALCIUM 7.9* 8.5 8.1*   GFR Estimated Creatinine Clearance: 33 ml/min (by C-G formula based on Cr of 0.46). Liver Function Tests:  Recent Labs Lab 09/30/13 1342 10/02/13 0500 10/04/13 0410  AST 264* 81* 38*  ALT 569* 319* 174*  ALKPHOS 96 116 123*  BILITOT 1.4* 1.4* 0.8  PROT 5.2* 5.0* 4.8*  ALBUMIN 2.1* 2.0* 1.9*   CBC:  Recent Labs Lab 10/02/13 0500 10/04/13 0410  WBC 11.4* 11.9*  HGB 10.1* 9.7*  HCT 29.5* 29.2*  MCV 83.1 83.9  PLT 232 262   BNP (last 3 results)  Recent Labs  07/28/13 0415 09/28/13 1426 09/30/13 1342  PROBNP 1586.0* 27885.0* 7860.0*   Microbiology Recent Results (from the past 240 hour(s))  CULTURE, BLOOD (ROUTINE X 2)     Status: None   Collection Time    09/28/13  5:31 PM      Result Value Range Status   Specimen Description BLOOD RIGHT HAND   Final   Special Requests BOTTLES DRAWN AEROBIC AND ANAEROBIC 10CC   Final   Culture  Setup Time     Final   Value: 09/29/2013 01:57     Performed at [facility]   Culture     Final   Value: STAPHYLOCOCCUS AUREUS     Note: RIFAMPIN AND GENTAMICIN SHOULD NOT BE USED AS SINGLE DRUGS FOR TREATMENT OF STAPH  INFECTIONS.     Note: Gram Stain Report Called to,Read Back By and Verified With: J CRUTCHFIELD ON 10/01/2013 AT 12:19A BY WILEJ     Performed at [facility]   Report Status 10/03/2013 FINAL   Final   Organism ID, Bacteria STAPHYLOCOCCUS AUREUS   Final  CULTURE, BLOOD (ROUTINE X 2)     Status: None   Collection Time    09/28/13  5:32 PM      Result Value Range Status   Specimen Description BLOOD RIGHT ARM   Final   Special Requests BOTTLES DRAWN AEROBIC AND ANAEROBIC 10CC   Final   Culture  Setup Time     Final   Value: 09/29/2013 02:32     Performed at [facility]   Culture     Final   Value: STAPHYLOCOCCUS AUREUS     Note: SUSCEPTIBILITIES PERFORMED ON PREVIOUS CULTURE WITHIN THE LAST 5 DAYS.     Note: Gram Stain Report Called to,Read Back By and Verified With: J CRUTCHFIELD ON 10/01/2013 AT 12:19A BY Serafina Mitchell     Performed at [facility]   Report Status 10/03/2013 FINAL   Final     Procedures and Diagnostic Studies: Dg Chest 2 View  09/28/2013  CLINICAL DATA:  Cough, COPD  EXAM: CHEST  2 VIEW  COMPARISON:  07/27/2013  FINDINGS: Chronic position markings carcinoma changes. Mild left basilar opacity, likely a combination of atelectasis and small left pleural effusion. No pneumothorax.  The heart is normal in size.  Discussion thoracolumbar scoliosis.  IMPRESSION: Mild left basilar opacity, likely a combination of atelectasis and small left pleural effusion.   Electronically Signed   By: Charline Bills M.D.   On: 09/28/2013 13:16   US Abdomen Complete  09/29/2013   *RADIOLOGY REPORT*  Clinical Data:  Elevated LFTs, initial encounter.  COMPLETE ABDOMINAL ULTRASOUND  Comparison:  CT abdomen pelvis - 07/07/2013  Findings:  Gallbladder:  There is layering echogenic debris compatible with sludge within an otherwise normal-appearing gallbladder.  No gallbladder wall thickening or pericholecystic fluid.  Negative sonographic Murphy's sign.  Common bile duct:  Normal in  size measuring 5.4 mm in diameter  Liver:  There is mild diffuse slightly coarsened echogenicity of the hepatic parenchyma.  No discrete hepatic lesions.  No definite intrahepatic biliary ductal dilatation.  There is a trace amount of ascites adjacent to the liver edge.  IVC:  Appears normal.  Pancreas:  Limited visualization of the pancreatic head and neck is normal.  Visualization of the pancreatic body and tail is obscured by bowel gas.  Spleen:  Normal in size measuring 5.1 cm in length.  There are multiple punctate echogenic foci within the spleen compatible with granulomas.  There is a trace amount of perisplenic fluid.  Right Kidney:  Normal cortical thickness, echogenicity and size, measuring 8.7 cm in length.  No focal renal lesions.  No echogenic renal stones.  Note is made of a extrarenal pelvis, similar to prior abdominal CT. No urinary obstruction.  Left Kidney:  Normal cortical thickness, echogenicity and size, measuring 10.0 cm in length.  No focal renal lesions.  No echogenic renal stones.  No urinary obstruction.  Abdominal aorta:  No aneurysm identified.  Incidental note is made of small bilateral pleural effusions.  IMPRESSION:  1.  Nonspecific mild slightly coarsened echogenicity of the hepatic parenchyma.  No discrete hepatic lesions. 2.  Layering echogenic sludge with an otherwise normal-appearing gallbladder.  3.  Small bilateral effusions with trace amount of perihepatic and perisplenic fluid, nonspecific but could be the sequela of volume overload/congestive heart failure.  Clinical correlation is advised.   Original Report Authenticated By: Tacey Ruiz, MD   Dg Chest Port 1 View  10/01/2013   CLINICAL DATA:  Pneumonia  EXAM: PORTABLE CHEST - 1 VIEW  COMPARISON:  09/28/2010  FINDINGS: Persistent left retrocardiac density is noted with associated small effusion. The lungs are otherwise clear. Degenerative changes are noted in the thoracic spine with multiple compression deformities. No  acute bony abnormality is seen.  IMPRESSION: Stable appearance of left basilar infiltrate.   Electronically Signed   By: Alcide Clever M.D.   On: 10/01/2013 18:44   Dg Knee Complete 4 Views Left  09/28/2013   CLINICAL DATA:  Fall, left knee abrasion  EXAM: LEFT KNEE - COMPLETE 4+ VIEW  COMPARISON:  None.  FINDINGS: No fracture or dislocation is seen.  Joint spaces are essentially preserved.  No suprapatellar knee joint effusion.  Vascular calcifications.  IMPRESSION: No fracture or dislocation is seen.   Electronically Signed   By: Charline Bills M.D.   On: 09/28/2013 13:18   Dg Abd 2 Views  09/30/2013   CLINICAL DATA:  Abdominal pain. Small bowel obstructions.  EXAM:  ABDOMEN - 2 VIEW  COMPARISON:  09/29/2013  FINDINGS: Multiple dilated bowel loops again seen, which show no significant change. Some of this represents colonic dilatation, however dilated small bowel loops cannot be excluded. There is no evidence of free air.  Severe lumbar spine degenerative changes and rotatory scoliosis again demonstrated.  IMPRESSION: No significant change in dilated bowel loops, possibly due to colonic ileus although bowel obstruction cannot definitely be excluded. Continued radiographic followup recommended.   Electronically Signed   By: Myles Rosenthal   On: 09/30/2013 16:05   Dg Abd 2 Views  09/29/2013   CLINICAL DATA:  Abdominal pain  EXAM: ABDOMEN - 2 VIEW  COMPARISON:  09/28/2013  FINDINGS: Distended large and small bowel loops unchanged from the prior study and most consistent with adynamic ileus. No free air.  IMPRESSION: Moderate ileus is unchanged from the prior study.   Electronically Signed   By: Marlan Palau M.D.   On: 09/29/2013 17:14   Dg Abd 2 Views  09/28/2013   CLINICAL DATA:  Abdominal pain.  Abdominal distention.  EXAM: ABDOMEN - 2 VIEW  COMPARISON:  07/26/2013  FINDINGS: There is mild bowel distention. Loops of mildly dilated small bowel on most evident in the left lower quadrant. Mildly dilated  colon is seen in the right lower quadrant and left lower quadrant and pelvis.  A bowel anastomosis staple line is noted in the right pelvis. Calcifications noted along a tortuous and ectatic abdominal aorta and into the iliac arteries. The soft tissues are otherwise unremarkable.  There is no free air on the decubitus view. There are a few air-fluid levels in the mildly dilated bowel.  Extensive degenerative changes and scoliosis are noted throughout the visualized spine, stable. The bones are extensively demineralized.  IMPRESSION: Mild dilation of small bowel as well as colon. There are few air-fluid levels. This suggests a mild adynamic ileus. No convincing obstruction. No free air.   Electronically Signed   By: Amie Portland   On: 09/28/2013 19:13    Scheduled Meds: . albuterol  2.5 mg Nebulization TID  . amiodarone  200 mg Oral BID  . aspirin  325 mg Oral Daily  . budesonide  6 mg Oral Q lunch  . famotidine  20 mg Oral BID  . feeding supplement (ENSURE)  1 Container Oral TID BM  . furosemide  20 mg Oral Daily  . heparin  5,000 Units Subcutaneous Q8H  . irbesartan  75 mg Oral Daily  . lidocaine  1 patch Transdermal Q24H  . mometasone-formoterol  2 puff Inhalation BID  . morphine CONCENTRATE  5 mg Oral Q4H  . sodium chloride  3 mL Intravenous Q12H   Continuous Infusions:   Time spent: 25 minutes.   LOS: 8 days   RAMA,CHRISTINA  [facility] Pager [phone].   *Please note that the hospitalists switch teams on Wednesdays. Please call the flow manager at [phone] if you are having difficulty reaching the hospitalist taking care of this patient as she can update you and provide the most up-to-date pager number of provider caring for the patient. If 8PM-8AM, please contact night-coverage at [email or web address], password [facility]  10/06/2013, 7:16 AM

## 2013-10-06 NOTE — Progress Notes (Signed)
Pt is being discharged to [facility].    CSW facilitated discharge needs by confirming [facility] liaison faxed discharge information, discussed with Pt's daughter via telephone, confirming RN called report, and arranged ambulance transport (service request id [phone])   No further CSW needs identified at this time.  CSW signing off.   Jacklynn Lewis, MSW, LCSWA  Clinical Social Work [phone]

## 2013-10-30 DEATH — deceased

## 2013-11-04 ENCOUNTER — Other Ambulatory Visit: Payer: Self-pay

## 2015-12-06 ENCOUNTER — Encounter: Payer: Self-pay | Admitting: Gastroenterology
